# Patient Record
Sex: Female | Born: 1950
Health system: Southern US, Community
[De-identification: ages and names within clinical notes are randomized; demographics above are authoritative.]

## PROBLEM LIST (undated history)

## (undated) DIAGNOSIS — M810 Age-related osteoporosis without current pathological fracture: Secondary | ICD-10-CM

## (undated) DIAGNOSIS — F329 Major depressive disorder, single episode, unspecified: Secondary | ICD-10-CM

## (undated) DIAGNOSIS — M1990 Unspecified osteoarthritis, unspecified site: Secondary | ICD-10-CM

## (undated) DIAGNOSIS — E785 Hyperlipidemia, unspecified: Secondary | ICD-10-CM

## (undated) DIAGNOSIS — F32A Depression, unspecified: Secondary | ICD-10-CM

## (undated) DIAGNOSIS — J45909 Unspecified asthma, uncomplicated: Secondary | ICD-10-CM

## (undated) DIAGNOSIS — T7840XA Allergy, unspecified, initial encounter: Secondary | ICD-10-CM

## (undated) DIAGNOSIS — F419 Anxiety disorder, unspecified: Secondary | ICD-10-CM

## (undated) DIAGNOSIS — H269 Unspecified cataract: Secondary | ICD-10-CM

## (undated) DIAGNOSIS — G43909 Migraine, unspecified, not intractable, without status migrainosus: Secondary | ICD-10-CM

## (undated) DIAGNOSIS — B181 Chronic viral hepatitis B without delta-agent: Secondary | ICD-10-CM

## (undated) HISTORY — DX: Chronic viral hepatitis B without delta-agent: B18.1

## (undated) HISTORY — DX: Age-related osteoporosis without current pathological fracture: M81.0

## (undated) HISTORY — DX: Migraine, unspecified, not intractable, without status migrainosus: G43.909

## (undated) HISTORY — PX: EYE SURGERY: SHX253

## (undated) HISTORY — DX: Depression, unspecified: F32.A

## (undated) HISTORY — DX: Unspecified cataract: H26.9

## (undated) HISTORY — DX: Major depressive disorder, single episode, unspecified: F32.9

## (undated) HISTORY — PX: WISDOM TOOTH EXTRACTION: SHX21

## (undated) HISTORY — DX: Allergy, unspecified, initial encounter: T78.40XA

## (undated) HISTORY — PX: URETHRAL DILATION: SUR417

## (undated) HISTORY — DX: Unspecified osteoarthritis, unspecified site: M19.90

## (undated) HISTORY — DX: Hyperlipidemia, unspecified: E78.5

## (undated) HISTORY — DX: Unspecified asthma, uncomplicated: J45.909

## (undated) HISTORY — DX: Anxiety disorder, unspecified: F41.9

---

## 1991-12-21 HISTORY — PX: LIVER BIOPSY: SHX301

## 2016-08-16 ENCOUNTER — Ambulatory Visit (INDEPENDENT_AMBULATORY_CARE_PROVIDER_SITE_OTHER): Payer: Medicare Other | Admitting: Neurology

## 2016-08-16 ENCOUNTER — Encounter: Payer: Self-pay | Admitting: Neurology

## 2016-08-16 VITALS — BP 126/72 | HR 88 | Temp 97.6°F | Wt 166.2 lb

## 2016-08-16 DIAGNOSIS — R413 Other amnesia: Secondary | ICD-10-CM | POA: Diagnosis not present

## 2016-08-16 DIAGNOSIS — G43909 Migraine, unspecified, not intractable, without status migrainosus: Secondary | ICD-10-CM | POA: Insufficient documentation

## 2016-08-16 DIAGNOSIS — G43709 Chronic migraine without aura, not intractable, without status migrainosus: Secondary | ICD-10-CM | POA: Diagnosis not present

## 2016-08-16 DIAGNOSIS — IMO0002 Reserved for concepts with insufficient information to code with codable children: Secondary | ICD-10-CM

## 2016-08-16 MED ORDER — ZOLMITRIPTAN 5 MG NA SOLN: 1.0000 | NASAL | 0 refills | Status: DC | PRN

## 2016-08-16 MED ORDER — DULOXETINE HCL 20 MG PO CPEP: ORAL_CAPSULE | ORAL | 5 refills | Status: DC

## 2016-08-16 MED ORDER — RELPAX 40 MG PO TABS: ORAL_TABLET | ORAL | 6 refills | Status: DC

## 2016-08-16 NOTE — Patient Instructions (Addendum)
1. Schedule for Botox with Dr. Tomi Likens or Posey Pronto 2. Increase Cymbalta 20mg : take 2 tablets daily 3. Take Relpax 40mg  at onset of migraine, do not take more than 2-3 a week to avoid rebound headaches 4. Try Zomig nasal spray for migraines upon awakening, let us know if helpful and we can call in a prescription 5. Follow-up in 3 months, call for any changes

## 2016-08-16 NOTE — Progress Notes (Signed)
NEUROLOGY CONSULTATION NOTE  Alyssa Durham MRN: [id number] DOB: 01/27/1951  Referring provider: Dr. Tor Netters Primary care provider: none  Reason for consult:  Chronic migraines  Dear Dr Tamsen Meek:  Thank you for your kind referral of Alyssa Durham for consultation of the above symptoms. Although her history is well known to you, please allow me to reiterate it for the purpose of our medical record. Records and images were personally reviewed where available.  HISTORY OF PRESENT ILLNESS: This is a 65 year old right-handed woman with a history of chronic migraines, depression, presenting to establish care for her migraines. She moved from [place] 2 months ago, records from her previous neurologist were reviewed. She has had migraines since 1969. Migraines now are mostly localized over the right hemisphere, if she closes her eyes, she sees flashing lights, but no prior aura. This is associated with nausea, vomiting, photo and phonophobia. She has 4-5 migraine days a week, taking 1/2 to 2 tablets of Relpax at the onset, which does help ease progression of symptoms. For the past 4 years, she has had migraines upon awakening. Migraines were fairly well-controlled until 1993 when she was started on a trial of an unrecalled antidepressant which helped. In December 2016, headaches worsened and she was started on Lexapro, which helped with headaches but caused weight gain and hair loss. She tried Celexa (weight gain), Zoloft (weight gain), Wellbutrin. She also tried Topamax but stopped due to risk for kidney stones. She recalls taking Inderal but cannot recall effects/side effects. Most recently, she was tried on nortriptyline, which did not help with the headaches, but helped her mood. She is now on Cymbalta for arthritis, which also helps with her mood. For rescue, she has tried sumatriptan, Maxalt, and Zomig, with no effect. Relpax and Frova helped the most, causing less palpitations. She has noticed  stress to be a big trigger, as well a chocolates. She usually sleeps good. There is a family history of migraines in her father, brother, and niece. She had received 2 rounds of Botox injections for chronic migraine, the first one helped very well with much less use of Relpax. Last Botox was in April 2017 before her move.   She is also reporting memory loss. She had told her neurologist about this in 1994. Memory issues are more for long-term memory, she cannot recall what happened years ago with her family, which concerns her because she cannot recall her children growing up. Short-term memory is pretty good, she denies getting lost driving, no missed medications or bill payments. She denies any dizziness, diplopia, dysarthria, dysphagia, neck pain, bowel/bladder dysfunction. She has floaters in her left eye.   Diagnostic Data: MRI brain without contrast done 04/17/15 at [facility] in [place] did not show any acute changes, there was subtle white matter hyperintensity in the posterior left occipital lobe. Compared to 2007 study, this is only minimally progressive. EEG done 04/18/15 in [place] was within normal limits.  PAST MEDICAL HISTORY: Past Medical History:  Diagnosis Date  . Asthma   . Chronic active type B viral hepatitis ([place])   . Migraines   . Osteoporosis     PAST SURGICAL HISTORY: Past Surgical History:  Procedure Laterality Date  . CESAREAN SECTION    . URETHRAL DILATION    . WISDOM TOOTH EXTRACTION      MEDICATIONS: Alendronate Cymbalta 20mg  daily Relpax 40mg  at onset of migraine   No current facility-administered medications on file prior to visit.  ALLERGIES: Allergies  Allergen Reactions  . Codeine Nausea And Vomiting    FAMILY HISTORY: Family History  Problem Relation Age of Onset  . Migraines Father   . Stroke Father     SOCIAL HISTORY: Social History   Social History  . Marital status: Divorced    Spouse name: N/A  . Number of  children: N/A  . Years of education: N/A   Occupational History  . Not on file.   Social History Main Topics  . Smoking status: Not on file  . Smokeless tobacco: Not on file  . Alcohol use Not on file  . Drug use: Unknown  . Sexual activity: Not on file   Other Topics Concern  . Not on file   Social History Narrative  . No narrative on file    REVIEW OF SYSTEMS: Constitutional: No fevers, chills, or sweats, no generalized fatigue, change in appetite Eyes: No visual changes, double vision, eye pain Ear, nose and throat: No hearing loss, ear pain, nasal congestion, sore throat Cardiovascular: No chest pain, palpitations Respiratory:  No shortness of breath at rest or with exertion, wheezes GastrointestinaI: No nausea, vomiting, diarrhea, abdominal pain, fecal incontinence Genitourinary:  No dysuria, urinary retention or frequency Musculoskeletal:  No neck pain, +back pain Integumentary: No rash, pruritus, skin lesions Neurological: as above Psychiatric: No depression, insomnia, anxiety Endocrine: No palpitations, fatigue, diaphoresis, mood swings, change in appetite, change in weight, increased thirst Hematologic/Lymphatic:  No anemia, purpura, petechiae. Allergic/Immunologic: no itchy/runny eyes, nasal congestion, recent allergic reactions, rashes  PHYSICAL EXAM: Vitals:   08/16/16 0903  BP: 126/72  Pulse: 88  Temp: 97.6 F (36.4 C)   General: No acute distress Head:  Normocephalic/atraumatic Eyes: Fundoscopic exam shows bilateral sharp discs, no vessel changes, exudates, or hemorrhages Neck: supple, no paraspinal tenderness, full range of motion Back: No paraspinal tenderness Heart: regular rate and rhythm Lungs: Clear to auscultation bilaterally. Vascular: No carotid bruits. Skin/Extremities: No rash, no edema Neurological Exam: Mental status: alert and oriented to person, place, and time, no dysarthria or aphasia, Fund of knowledge is appropriate.  Recent and  remote memory are intact.  Attention and concentration are normal.    Able to name objects and repeat phrases.  MMSE - Mini Mental State Exam 08/16/2016  Orientation to time 5  Orientation to Place 5  Registration 3  Attention/ Calculation 5  Recall 3  Language- name 2 objects 2  Language- repeat 1  Language- follow 3 step command 3  Language- read & follow direction 1  Write a sentence 1  Copy design 1  Total score 30   Cranial nerves: CN I: not tested CN II: pupils equal, round and reactive to light, visual fields intact, fundi unremarkable. CN III, IV, VI:  full range of motion, no nystagmus, no ptosis CN V: facial sensation intact CN VII: upper and lower face symmetric CN VIII: hearing intact to finger rub CN IX, X: gag intact, uvula midline CN XI: sternocleidomastoid and trapezius muscles intact CN XII: tongue midline Bulk & Tone: normal, no fasciculations. Motor: 5/5 throughout with no pronator drift. Sensation: intact to light touch, cold, pin, vibration and joint position sense.  No extinction to double simultaneous stimulation.  Romberg test negative Deep Tendon Reflexes: brisk +2 throughout, no ankle clonus, negative Hoffman sign Plantar responses: downgoing bilaterally Cerebellar: no incoordination on finger to nose, heel to shin. No dysdiadochokinesia Gait: narrow-based and steady, able to tandem walk adequately. Tremor: none  IMPRESSION: This is a 65  year old right-handed woman with chronic migraines, with 4-5 migraines a week. She had good response to Botox in [place], and would like to resume here in [place]. She will be set up for this in our office. She was also instructed to minimize rescue medication/Relpax use to 2-3 times a week to avoid rebound headaches. She will increase Cymbalta to 40mg  daily for headache prevention as well as mood. Side effects were discussed. We discussed memory concerns, including different causes of memory issues, such as checking  B12, TSH, and doing Neuropsychological evaluation. She would like to hold off for now and focus on the headaches. She will follow-up in 3 months and knows to call for any issues.  Thank you for allowing me to participate in the care of this patient. Please do not hesitate to call for any questions or concerns.   Ellouise Newer, M.D.  CC: Dr. Tamsen Meek

## 2016-08-31 ENCOUNTER — Ambulatory Visit (INDEPENDENT_AMBULATORY_CARE_PROVIDER_SITE_OTHER): Payer: Medicare Other | Admitting: Neurology

## 2016-08-31 ENCOUNTER — Other Ambulatory Visit (INDEPENDENT_AMBULATORY_CARE_PROVIDER_SITE_OTHER): Payer: Medicare Other | Admitting: *Deleted

## 2016-08-31 ENCOUNTER — Telehealth: Payer: Self-pay

## 2016-08-31 DIAGNOSIS — G43011 Migraine without aura, intractable, with status migrainosus: Secondary | ICD-10-CM

## 2016-08-31 DIAGNOSIS — G4489 Other headache syndrome: Secondary | ICD-10-CM | POA: Diagnosis not present

## 2016-08-31 DIAGNOSIS — IMO0002 Reserved for concepts with insufficient information to code with codable children: Secondary | ICD-10-CM

## 2016-08-31 DIAGNOSIS — G43709 Chronic migraine without aura, not intractable, without status migrainosus: Secondary | ICD-10-CM

## 2016-08-31 MED ORDER — ZOLMITRIPTAN 5 MG NA SOLN
1.0000 | NASAL | 0 refills | Status: DC | PRN
Start: 2016-08-31 — End: 2016-11-16

## 2016-08-31 MED ORDER — ONABOTULINUMTOXINA 100 UNITS IJ SOLR
155.0000 [IU] | Freq: Once | INTRAMUSCULAR | Status: AC
  Administered 2016-08-31: 155 [IU] via INTRAMUSCULAR

## 2016-08-31 NOTE — Procedures (Signed)
[facility]   Procedure Note Botox  Attending: Dr. Narda Amber  Preoperative Diagnosis(es): Chronic migraine  Consent obtained from: The patient Benefits discussed included, but were not limited to decreased muscle tightness, increased joint range of motion, and decreased pain.  Risk discussed included, but were not limited pain and discomfort, bleeding, bruising, excessive weakness, venous thrombosis, muscle atrophy and dysphagia.  Anticipated outcomes of the procedure as well as he risks and benefits of the alternatives to the procedure, and the roles and tasks of the personnel to be involved, were discussed with the patient, and the patient consents to the procedure and agrees to proceed. A copy of the patient medication guide was given to the patient which explains the blackbox warning.  Patients identity and treatment sites confirmed Yes.    Details of Procedure: Skin was cleaned with alcohol. Prior to injection, the needle plunger was aspirated to make sure the needle was not within a blood vessel.  There was no blood retrieved on aspiration.    Following is a summary of the muscles injected  And the amount of Botulinum toxin used:   Agent and Dilution 200 units of botulinum Type A (Onobotulinum Toxin type A) was reconstituted with 4 ml of preservative free normal saline.  Time of reconstitution: At the time of the office visit (<30 minutes prior to injection)   Injections   155 total units of Botox was injected with a 30 gauge needle.  Injection Sites: L occipitalis: 15 units- 3 sites  R occiptalis: 15 units- 3 sites  L upper trapezius: 15 units- 3 sites R upper trapezius: 15 units- 3 sits          L paraspinal (___mid__level): 10 units- 2 sites R paraspinal (__mid___level): 10 units- 2 sites   Face L frontalis(2 injection sites):10 units   R frontalis(2 injection sites):10 units         L corrugator: 5 units   R corrugator: 5 units           Procerus: 5  units   L temporalis: 20 units R temporalis: 20 units    Total injected (Units): 155  Total wasted (Units): 45   Patient tolerated procedure well without complications.   Reinjection is anticipated in 3 months.

## 2016-08-31 NOTE — Telephone Encounter (Signed)
Sample Zomig nasal spray left up front.

## 2016-11-09 ENCOUNTER — Ambulatory Visit (INDEPENDENT_AMBULATORY_CARE_PROVIDER_SITE_OTHER): Payer: Medicare Other | Admitting: Neurology

## 2016-11-09 DIAGNOSIS — G43709 Chronic migraine without aura, not intractable, without status migrainosus: Secondary | ICD-10-CM

## 2016-11-09 DIAGNOSIS — IMO0002 Reserved for concepts with insufficient information to code with codable children: Secondary | ICD-10-CM

## 2016-11-09 DIAGNOSIS — G43011 Migraine without aura, intractable, with status migrainosus: Secondary | ICD-10-CM

## 2016-11-09 MED ORDER — ONABOTULINUMTOXINA 100 UNITS IJ SOLR
155.0000 [IU] | Freq: Once | INTRAMUSCULAR | Status: AC
  Administered 2016-11-09: 155 [IU] via INTRAMUSCULAR

## 2016-11-09 NOTE — Procedures (Signed)
[facility]   Procedure Note Botox  Attending: Dr. Narda Amber  Preoperative Diagnosis(es): Chronic migraine  Consent obtained from: The patient Benefits discussed included, but were not limited to decreased muscle tightness, increased joint range of motion, and decreased pain.  Risk discussed included, but were not limited pain and discomfort, bleeding, bruising, excessive weakness, venous thrombosis, muscle atrophy and dysphagia.  Anticipated outcomes of the procedure as well as he risks and benefits of the alternatives to the procedure, and the roles and tasks of the personnel to be involved, were discussed with the patient, and the patient consents to the procedure and agrees to proceed. A copy of the patient medication guide was given to the patient which explains the blackbox warning.  Patients identity and treatment sites confirmed Yes.    Details of Procedure: Skin was cleaned with alcohol. Prior to injection, the needle plunger was aspirated to make sure the needle was not within a blood vessel.  There was no blood retrieved on aspiration.    Following is a summary of the muscles injected  And the amount of Botulinum toxin used:   Agent and Dilution 200 units of botulinum Type A (Onobotulinum Toxin type A) was reconstituted with 4 ml of preservative free normal saline.  Time of reconstitution: At the time of the office visit (<30 minutes prior to injection)   Injections   155 total units of Botox was injected with a 30 gauge needle.  Injection Sites: L occipitalis: 15 units- 3 sites  R occiptalis: 15 units- 3 sites  L upper trapezius: 15 units- 3 sites R upper trapezius: 15 units- 3 sits          L paraspinal (___mid__level): 10 units- 2 sites R paraspinal (__mid___level): 10 units- 2 sites   Face L frontalis(2 injection sites):10 units   R frontalis(2 injection sites):10 units         L corrugator: 5 units   R corrugator: 5 units           Procerus: 5  units   L temporalis: 20 units R temporalis: 20 units    Total injected (Units): 155  Total wasted (Units): 45   Patient tolerated procedure well without complications.   Reinjection is anticipated in 3 months.

## 2016-11-16 ENCOUNTER — Encounter: Payer: Self-pay | Admitting: Neurology

## 2016-11-16 ENCOUNTER — Ambulatory Visit (INDEPENDENT_AMBULATORY_CARE_PROVIDER_SITE_OTHER): Payer: Self-pay | Admitting: Neurology

## 2016-11-16 VITALS — BP 122/78 | HR 83 | Ht 61.0 in | Wt 168.5 lb

## 2016-11-16 DIAGNOSIS — G43709 Chronic migraine without aura, not intractable, without status migrainosus: Secondary | ICD-10-CM

## 2016-11-16 DIAGNOSIS — IMO0002 Reserved for concepts with insufficient information to code with codable children: Secondary | ICD-10-CM

## 2016-11-16 MED ORDER — ZONISAMIDE 100 MG PO CAPS: ORAL_CAPSULE | ORAL | 6 refills | Status: DC

## 2016-11-16 NOTE — Progress Notes (Signed)
NEUROLOGY FOLLOW UP OFFICE NOTE  Alyssa Durham [id number]  HISTORY OF PRESENT ILLNESS: I had the pleasure of seeing Alyssa Durham in follow-up in the neurology clinic on 11/16/2016.  The patient was last seen 3 months ago for chronic migraines and memory loss. Since her last visit, she had Botox with Dr. Posey Pronto last week. Dose of Cymbalta was increased on her last visit to 40mg  daily, however she did not tolerate this and went back to 20mg /day. She reports feeling "wired up, racing, and unable to sleep" on the higher dose. She feels the Botox has reduced her migraines by 50%. She was reporting 4-5 migraine days a week, but has had not migraines on awakening the past 2 mornings.   HPI 08/16/2016: This is a 65 yo RH woman with a history of chronic migraines, depression. She moved from [place] 2 months ago, records from her previous neurologist were reviewed. She has had migraines since 1969. Migraines now are mostly localized over the right hemisphere, if she closes her eyes, she sees flashing lights, but no prior aura. This is associated with nausea, vomiting, photo and phonophobia. She has 4-5 migraine days a week, taking 1/2 to 2 tablets of Relpax at the onset, which does help ease progression of symptoms. For the past 4 years, she has had migraines upon awakening. Migraines were fairly well-controlled until 1993 when she was started on a trial of an unrecalled antidepressant which helped. In December 2016, headaches worsened and she was started on Lexapro, which helped with headaches but caused weight gain and hair loss. She tried Celexa (weight gain), Zoloft (weight gain), Wellbutrin. She also tried Topamax but stopped due to risk for kidney stones. She recalls taking Inderal but cannot recall effects/side effects. Most recently, she was tried on nortriptyline, which did not help with the headaches, but helped her mood. She is now on Cymbalta for arthritis, which also helps with her mood. For rescue, she has  tried sumatriptan, Maxalt, and Zomig, with no effect. Relpax and Frova helped the most, causing less palpitations. She has noticed stress to be a big trigger, as well a chocolates. She usually sleeps good. There is a family history of migraines in her father, brother, and niece. She had received 2 rounds of Botox injections for chronic migraine, the first one helped very well with much less use of Relpax. Last Botox was in April 2017 before her move.   She is also reporting memory loss. She had told her neurologist about this in 1994. Memory issues are more for long-term memory, she cannot recall what happened years ago with her family, which concerns her because she cannot recall her children growing up. Short-term memory is pretty good, she denies getting lost driving, no missed medications or bill payments. She denies any dizziness, diplopia, dysarthria, dysphagia, neck pain, bowel/bladder dysfunction. She has floaters in her left eye.   Diagnostic Data: MRI brain without contrast done 04/17/15 at [facility] in [place] did not show any acute changes, there was subtle white matter hyperintensity in the posterior left occipital lobe. Compared to 2007 study, this is only minimally progressive. EEG done 04/18/15 in [place] was within normal limits.  PAST MEDICAL HISTORY: Past Medical History:  Diagnosis Date  . Asthma   . Chronic active type B viral hepatitis ([place])   . Migraines   . Osteoporosis     MEDICATIONS: Current Outpatient Prescriptions on File Prior to Visit  Medication Sig Dispense Refill  . Alendronate Sodium (FOSAMAX PO)  Take by mouth.    . Clobetasol Prop Emollient Base 0.05 % emollient cream Apply topically 2 (two) times daily.    . DULoxetine (CYMBALTA) 20 MG capsule Take 2 tablets daily 60 capsule 5  . RELPAX 40 MG tablet Take 1 tablet at onset of migraine, do not take more than 3 a week 12 tablet 6  . zolmitriptan (ZOMIG) 5 MG nasal solution Place 1 spray into  the nose as needed for migraine. 1 each 0   No current facility-administered medications on file prior to visit.     ALLERGIES: Allergies  Allergen Reactions  . Codeine Nausea And Vomiting    FAMILY HISTORY: Family History  Problem Relation Age of Onset  . Migraines Father   . Stroke Father     SOCIAL HISTORY: Social History   Social History  . Marital status: Divorced    Spouse name: N/A  . Number of children: N/A  . Years of education: N/A   Occupational History  . Not on file.   Social History Main Topics  . Smoking status: Former Research scientist (life sciences)  . Smokeless tobacco: Never Used  . Alcohol use No  . Drug use: No  . Sexual activity: Not on file   Other Topics Concern  . Not on file   Social History Narrative  . No narrative on file    REVIEW OF SYSTEMS: Constitutional: No fevers, chills, or sweats, no generalized fatigue, change in appetite Eyes: No visual changes, double vision, eye pain Ear, nose and throat: No hearing loss, ear pain, nasal congestion, sore throat Cardiovascular: No chest pain, palpitations Respiratory:  No shortness of breath at rest or with exertion, wheezes GastrointestinaI: No nausea, vomiting, diarrhea, abdominal pain, fecal incontinence Genitourinary:  No dysuria, urinary retention or frequency Musculoskeletal:  No neck pain, back pain Integumentary: No rash, pruritus, skin lesions Neurological: as above Psychiatric: No depression, insomnia, anxiety Endocrine: No palpitations, fatigue, diaphoresis, mood swings, change in appetite, change in weight, increased thirst Hematologic/Lymphatic:  No anemia, purpura, petechiae. Allergic/Immunologic: no itchy/runny eyes, nasal congestion, recent allergic reactions, rashes  PHYSICAL EXAM: Vitals:   11/16/16 1424  BP: 122/78  Pulse: 83   General: No acute distress Head:  Normocephalic/atraumatic Neck: supple, no paraspinal tenderness, full range of motion Heart:  Regular rate and  rhythm Lungs:  Clear to auscultation bilaterally Back: No paraspinal tenderness Skin/Extremities: No rash, no edema Neurological Exam: alert and oriented to person, place, and time. No aphasia or dysarthria. Fund of knowledge is appropriate.  Recent and remote memory are intact.  Attention and concentration are normal.    Able to name objects and repeat phrases. Cranial nerves: Pupils equal, round, reactive to light.  Fundoscopic exam unremarkable, no papilledema. Extraocular movements intact with no nystagmus. Visual fields full. Facial sensation intact. No facial asymmetry. Tongue, uvula, palate midline.  Motor: Bulk and tone normal, muscle strength 5/5 throughout with no pronator drift.  Sensation to light touch intact.  No extinction to double simultaneous stimulation.  Deep tendon reflexes brisk  2+ throughout, toes downgoing.  Finger to nose testing intact.  Gait narrow-based and steady, able to tandem walk adequately.  Romberg negative.  IMPRESSION: This is a 65 yo RH woman with chronic migraines, with 4-5 migraines a week. She had good response to Botox in [place], and after 1 session last week reports a 50% reduction in migraines. Continue Botox every 3 months. She did not tolerate higher dose Cymbalta and is interested in trying a different headache preventative medication.  She will start low dose Zonisamide 100mg  qhs, side effects were discussed. She has a history of kidney stones, there is a low risk of kidney stones with Zonisamide, she was advised to increase hydration and alert [place] if any symptoms. She knows to minimize rescue medication/Relpax use to 2-3 times a week to avoid rebound headaches. We will address memory concerns on her next visit in 3 months.   Thank you for allowing me to participate in her care.  Please do not hesitate to call for any questions or concerns.  The duration of this appointment visit was 15 minutes of face-to-face time with the patient.  Greater than 50% of this  time was spent in counseling, explanation of diagnosis, planning of further management, and coordination of care.   Ellouise Newer, M.D.

## 2016-11-16 NOTE — Patient Instructions (Signed)
1. Schedule Botox with Dr. Posey Pronto for February 2. Start Zonisamide 100mg : take 1 capsule at night 3. Minimize Relpax intake to 2-3 a week to avoid rebound headaches 4. Follow-up in 3 months (after Botox), call for any changes

## 2016-11-19 ENCOUNTER — Other Ambulatory Visit: Payer: Self-pay | Admitting: Neurology

## 2016-11-19 DIAGNOSIS — IMO0002 Reserved for concepts with insufficient information to code with codable children: Secondary | ICD-10-CM

## 2016-11-19 DIAGNOSIS — G43709 Chronic migraine without aura, not intractable, without status migrainosus: Secondary | ICD-10-CM

## 2016-11-19 NOTE — Telephone Encounter (Signed)
Rx sent 

## 2016-12-25 ENCOUNTER — Other Ambulatory Visit: Payer: Self-pay | Admitting: Neurology

## 2016-12-25 DIAGNOSIS — IMO0002 Reserved for concepts with insufficient information to code with codable children: Secondary | ICD-10-CM

## 2016-12-25 DIAGNOSIS — G43709 Chronic migraine without aura, not intractable, without status migrainosus: Secondary | ICD-10-CM

## 2016-12-27 ENCOUNTER — Telehealth: Payer: Self-pay | Admitting: Neurology

## 2016-12-27 NOTE — Telephone Encounter (Signed)
Responded to RX Request this AM . If prior auth required pharmacy will notify us. Patient notified.

## 2016-12-27 NOTE — Telephone Encounter (Signed)
Rx refill sent. Per last OV note patient to continue.

## 2016-12-27 NOTE — Telephone Encounter (Signed)
Alyssa Durham [birth year]. Her #[phone] She needs a refill on Relpax 40 MG Tablets. Her pharmacy is [facility] in [place]. She has a new Environmental consultant. She has a little over a half tablet left. She said something may have to be put in so insurance will cover (non Formulary)30 day supply?  Thank you

## 2017-01-18 ENCOUNTER — Other Ambulatory Visit: Payer: Self-pay | Admitting: Neurology

## 2017-01-18 DIAGNOSIS — G43709 Chronic migraine without aura, not intractable, without status migrainosus: Secondary | ICD-10-CM

## 2017-01-18 DIAGNOSIS — IMO0002 Reserved for concepts with insufficient information to code with codable children: Secondary | ICD-10-CM

## 2017-01-18 NOTE — Telephone Encounter (Signed)
RX refill sent to pharmacy. Per last note patient to continue.

## 2017-02-07 ENCOUNTER — Telehealth: Payer: Self-pay | Admitting: Neurology

## 2017-02-07 NOTE — Telephone Encounter (Signed)
PT called and is wanting to set up a Botox day/Dawn CB# [phone]

## 2017-02-08 NOTE — Telephone Encounter (Signed)
Will set up appointment when PA is received.  Left message notifying patient.

## 2017-02-18 ENCOUNTER — Telehealth: Payer: Self-pay | Admitting: Neurology

## 2017-02-18 NOTE — Telephone Encounter (Signed)
Alyssa Durham 2051-01-16. She was calling to check on her Botox Authorization? She said she is starting to have migraines. Her # is [phone]. Thank you

## 2017-02-18 NOTE — Telephone Encounter (Signed)
Patient notified that I will work on the PA and call her with an appointment.

## 2017-02-23 ENCOUNTER — Telehealth: Payer: Self-pay | Admitting: Neurology

## 2017-02-23 NOTE — Telephone Encounter (Signed)
Alyssa Durham 2051/07/26. Her # [phone]. She was calling to follow up on her Botox authorization and appointment. She said she is suffering and really needs to get the Botox. I did tell her that you were working on the [place]. Thank you

## 2017-02-23 NOTE — Telephone Encounter (Signed)
Called ins company and their computers were down.  Will try again.

## 2017-02-24 NOTE — Telephone Encounter (Signed)
Sent request for [facility] PA.

## 2017-02-28 ENCOUNTER — Other Ambulatory Visit: Payer: Self-pay | Admitting: Neurology

## 2017-02-28 ENCOUNTER — Ambulatory Visit: Payer: Medicare Other | Admitting: Neurology

## 2017-02-28 DIAGNOSIS — G43709 Chronic migraine without aura, not intractable, without status migrainosus: Secondary | ICD-10-CM

## 2017-02-28 DIAGNOSIS — IMO0002 Reserved for concepts with insufficient information to code with codable children: Secondary | ICD-10-CM

## 2017-03-01 ENCOUNTER — Telehealth: Payer: Self-pay | Admitting: *Deleted

## 2017-03-01 NOTE — Telephone Encounter (Signed)
Patient called her insurance company and they said that they do not need PA for Botox.  I also called and spoke with [facility] at [facility] and she looked it up and it does not need PA.  Will you let Alyssa Durham know when to schedule this please.  Thanks.

## 2017-03-08 ENCOUNTER — Ambulatory Visit: Payer: PPO | Admitting: Neurology

## 2017-03-10 DIAGNOSIS — L900 Lichen sclerosus et atrophicus: Secondary | ICD-10-CM | POA: Diagnosis not present

## 2017-03-10 DIAGNOSIS — L820 Inflamed seborrheic keratosis: Secondary | ICD-10-CM | POA: Diagnosis not present

## 2017-03-14 ENCOUNTER — Ambulatory Visit (INDEPENDENT_AMBULATORY_CARE_PROVIDER_SITE_OTHER): Payer: PPO | Admitting: Neurology

## 2017-03-14 ENCOUNTER — Encounter: Payer: Self-pay | Admitting: Neurology

## 2017-03-14 VITALS — BP 126/78 | HR 78 | Temp 98.0°F | Resp 16 | Ht 61.5 in | Wt 172.0 lb

## 2017-03-14 DIAGNOSIS — IMO0002 Reserved for concepts with insufficient information to code with codable children: Secondary | ICD-10-CM

## 2017-03-14 DIAGNOSIS — G43709 Chronic migraine without aura, not intractable, without status migrainosus: Secondary | ICD-10-CM

## 2017-03-14 MED ORDER — ZOLMITRIPTAN 5 MG NA SOLN: 1.0000 | NASAL | 0 refills | Status: DC | PRN

## 2017-03-14 MED ORDER — RELPAX 40 MG PO TABS: ORAL_TABLET | ORAL | 11 refills | Status: DC

## 2017-03-14 MED ORDER — ZONISAMIDE 100 MG PO CAPS: ORAL_CAPSULE | ORAL | 6 refills | Status: DC

## 2017-03-14 NOTE — Patient Instructions (Signed)
1. Increase Zonisamide 100mg : Take 2 capsules at night 2. Proceed with Botox as scheduled 3. Minimize Relpax to 2-3 times a week to avoid rebound headaches 4. Keep a calendar of your migraines 5. Follow-up in 4 months, call for any changes

## 2017-03-14 NOTE — Progress Notes (Signed)
NEUROLOGY FOLLOW UP OFFICE NOTE  Alyssa Durham [id number]  HISTORY OF PRESENT ILLNESS: I had the pleasure of seeing Ibeth Anzalone in follow-up in the neurology clinic on  03/14/2017.  The patient was last seen 4 months ago for chronic migraines and memory loss. On her last visit, she reported reduction in migraines by 50% with re-initiation of Botox. She is scheduled for her next session next week. On her last visit, she was started on low dose Zonisamide. She reports she is so much better than before, she has around 2-3 migraines a week. Relpax helps. In January, she was waking up migraine-free more days than usual. However she was back to her original pattern in February. She initially felt "something in my urinary tract" when first starting Zonisamide, but with increased water intake, this has resolved. She briefly tapered herself off Cymbalta due to weight gain, however with recent stresses, she noticed that she was much more irritable. She restarted it last week, and within 2-3 days she was feeling better. She was also taking Cymbalta for joint pains, this briefly worsened off Cymbalta as well. She denies any diplopia, vision changes, dizziness, no falls. She is having a mild migraine now.  HPI 08/16/2016: This is a 66 yo RH woman with a history of chronic migraines, depression. She moved from [place] 2 months ago, records from her previous neurologist were reviewed. She has had migraines since 1969. Migraines now are mostly localized over the right hemisphere, if she closes her eyes, she sees flashing lights, but no prior aura. This is associated with nausea, vomiting, photo and phonophobia. She has 4-5 migraine days a week, taking 1/2 to 2 tablets of Relpax at the onset, which does help ease progression of symptoms. For the past 4 years, she has had migraines upon awakening. Migraines were fairly well-controlled until 1993 when she was started on a trial of an unrecalled antidepressant which helped. In  December 2016, headaches worsened and she was started on Lexapro, which helped with headaches but caused weight gain and hair loss. She tried Celexa (weight gain), Zoloft (weight gain), Wellbutrin. She also tried Topamax but stopped due to risk for kidney stones. She recalls taking Inderal but cannot recall effects/side effects. Most recently, she was tried on nortriptyline, which did not help with the headaches, but helped her mood. She is now on Cymbalta for arthritis, which also helps with her mood. For rescue, she has tried sumatriptan, Maxalt, and Zomig, with no effect. Relpax and Frova helped the most, causing less palpitations. She has noticed stress to be a big trigger, as well a chocolates. She usually sleeps good. There is a family history of migraines in her father, brother, and niece. She had received 2 rounds of Botox injections for chronic migraine, the first one helped very well with much less use of Relpax. Last Botox was in April 2017 before her move.   She is also reporting memory loss. She had told her neurologist about this in 1994. Memory issues are more for long-term memory, she cannot recall what happened years ago with her family, which concerns her because she cannot recall her children growing up. Short-term memory is pretty good, she denies getting lost driving, no missed medications or bill payments. She denies any dizziness, diplopia, dysarthria, dysphagia, neck pain, bowel/bladder dysfunction. She has floaters in her left eye.   Diagnostic Data: MRI brain without contrast done 04/17/15 at [facility] in [place] did not show any acute changes, there was subtle  white matter hyperintensity in the posterior left occipital lobe. Compared to 2007 study, this is only minimally progressive. EEG done 04/18/15 in [place] was within normal limits.  PAST MEDICAL HISTORY: Past Medical History:  Diagnosis Date  . Asthma   . Chronic active type B viral hepatitis ([place])   .  Migraines   . Osteoporosis     MEDICATIONS: Current Outpatient Prescriptions on File Prior to Visit  Medication Sig Dispense Refill  . Alendronate Sodium (FOSAMAX PO) Take by mouth.    . Clobetasol Prop Emollient Base 0.05 % emollient cream Apply topically 2 (two) times daily.    . DULoxetine (CYMBALTA) 20 MG capsule Take 2 tablets daily 60 capsule 5  . RELPAX 40 MG tablet TAKE 1 TABLET BY MOUTH AT ONSET OF MIGRAINE. DO NOT TAKE MORE THAN 3 A WEEK 12 tablet 0  . zonisamide (ZONEGRAN) 100 MG capsule Take 1 capsule at night 30 capsule 6   No current facility-administered medications on file prior to visit.     ALLERGIES: Allergies  Allergen Reactions  . Codeine Nausea And Vomiting    FAMILY HISTORY: Family History  Problem Relation Age of Onset  . Migraines Father   . Stroke Father     SOCIAL HISTORY: Social History   Social History  . Marital status: Divorced    Spouse name: N/A  . Number of children: N/A  . Years of education: N/A   Occupational History  . Not on file.   Social History Main Topics  . Smoking status: Former Research scientist (life sciences)  . Smokeless tobacco: Never Used  . Alcohol use No  . Drug use: No  . Sexual activity: Not on file   Other Topics Concern  . Not on file   Social History Narrative  . No narrative on file    REVIEW OF SYSTEMS: Constitutional: No fevers, chills, or sweats, no generalized fatigue, change in appetite Eyes: No visual changes, double vision, eye pain Ear, nose and throat: No hearing loss, ear pain, nasal congestion, sore throat Cardiovascular: No chest pain, palpitations Respiratory:  No shortness of breath at rest or with exertion, wheezes GastrointestinaI: No nausea, vomiting, diarrhea, abdominal pain, fecal incontinence Genitourinary:  No dysuria, urinary retention or frequency Musculoskeletal:  No neck pain, back pain Integumentary: No rash, pruritus, skin lesions Neurological: as above Psychiatric: No depression, insomnia,  anxiety Endocrine: No palpitations, fatigue, diaphoresis, mood swings, change in appetite, change in weight, increased thirst Hematologic/Lymphatic:  No anemia, purpura, petechiae. Allergic/Immunologic: no itchy/runny eyes, nasal congestion, recent allergic reactions, rashes  PHYSICAL EXAM: Vitals:   03/14/17 1421  BP: 126/78  Pulse: 78  Resp: 16  Temp: 98 F (36.7 C)   General: No acute distress Head:  Normocephalic/atraumatic Neck: supple, no paraspinal tenderness, full range of motion Heart:  Regular rate and rhythm Lungs:  Clear to auscultation bilaterally Back: No paraspinal tenderness Skin/Extremities: No rash, no edema Neurological Exam: alert and oriented to person, place, and time. No aphasia or dysarthria. Fund of knowledge is appropriate.  Recent and remote memory are intact.  Attention and concentration are normal.    Able to name objects and repeat phrases. Cranial nerves: Pupils equal, round, reactive to light.  Fundoscopic exam unremarkable, no papilledema. Extraocular movements intact with no nystagmus. Visual fields full. Facial sensation intact. No facial asymmetry. Tongue, uvula, palate midline.  Motor: Bulk and tone normal, muscle strength 5/5 throughout with no pronator drift.  Sensation to light touch intact.  No extinction to double simultaneous stimulation.  Deep tendon reflexes brisk  2+ throughout, toes downgoing.  Finger to nose testing intact.  Gait narrow-based and steady, able to tandem walk adequately.  Romberg negative.  IMPRESSION: This is a 66 yo RH woman with chronic migraines, with 4-5 migraines a week. She had good response to Botox in [place], and this was re-initiated in our office with good response. She is scheduled for her next session next week. She also noted an improvement with addition of Zonisamide, we will increase dose to 200mg  qhs. She had problems stopping Cymbalta, and will discuss this with her new PCP in 2 weeks. She knows to minimize  Relpax to 2-3 a week to avoid rebound headaches. She will keep a calendar of her migraines and follow-up in 4 months.   Thank you for allowing me to participate in her care.  Please do not hesitate to call for any questions or concerns.  The duration of this appointment visit was 25 minutes of face-to-face time with the patient.  Greater than 50% of this time was spent in counseling, explanation of diagnosis, planning of further management, and coordination of care.   Ellouise Newer, M.D.

## 2017-03-17 ENCOUNTER — Ambulatory Visit: Payer: PPO | Admitting: Neurology

## 2017-03-22 ENCOUNTER — Ambulatory Visit (INDEPENDENT_AMBULATORY_CARE_PROVIDER_SITE_OTHER): Payer: PPO | Admitting: Neurology

## 2017-03-22 DIAGNOSIS — G43011 Migraine without aura, intractable, with status migrainosus: Secondary | ICD-10-CM

## 2017-03-22 MED ORDER — ONABOTULINUMTOXINA 100 UNITS IJ SOLR: 100.0000 [IU] | Freq: Once | INTRAMUSCULAR | Status: DC

## 2017-03-22 MED ORDER — ONABOTULINUMTOXINA 100 UNITS IJ SOLR
200.0000 [IU] | Freq: Once | INTRAMUSCULAR | Status: AC
  Administered 2017-03-22: 145 [IU] via INTRAMUSCULAR

## 2017-03-22 NOTE — Procedures (Signed)
[facility] Clinic   Procedure Note Botox  Attending: Dr. Narda Amber  Preoperative Diagnosis(es): Chronic migraine  Consent obtained from: The patient Benefits discussed included, but were not limited to decreased muscle tightness, increased joint range of motion, and decreased pain.  Risk discussed included, but were not limited pain and discomfort, bleeding, bruising, excessive weakness, venous thrombosis, muscle atrophy and dysphagia.  Anticipated outcomes of the procedure as well as he risks and benefits of the alternatives to the procedure, and the roles and tasks of the personnel to be involved, were discussed with the patient, and the patient consents to the procedure and agrees to proceed. A copy of the patient medication guide was given to the patient which explains the blackbox warning.  Patients identity and treatment sites confirmed Yes.    Details of Procedure: Skin was cleaned with alcohol. Prior to injection, the needle plunger was aspirated to make sure the needle was not within a blood vessel.  There was no blood retrieved on aspiration.    Following is a summary of the muscles injected  And the amount of Botulinum toxin used:   Agent and Dilution 200 units of botulinum Type A (Onobotulinum Toxin type A) was reconstituted with 4 ml of preservative free normal saline.  Time of reconstitution: At the time of the office visit (<30 minutes prior to injection)   Injections   155 total units of Botox was injected with a 30 gauge needle.  Injection Sites: L occipitalis: 15 units- 3 sites  R occiptalis: 15 units- 3 sites  L upper trapezius: 15 units- 3 sites R upper trapezius: 15 units- 3 sits          L paraspinal (___mid__level): 10 units- 2 sites R paraspinal (__mid___level): 10 units- 2 sites   Face L frontalis(2 injection sites):10 units   R frontalis(2 injection sites):10 units         L corrugator: 5 units   R corrugator: 5 units           Procerus: 5  units   L temporalis: 15 units R temporalis: 15 units    Total injected (Units): 145  Total wasted (Units): 55   Patient tolerated procedure well without complications.   Reinjection is anticipated in 3 months.

## 2017-03-29 ENCOUNTER — Ambulatory Visit (INDEPENDENT_AMBULATORY_CARE_PROVIDER_SITE_OTHER): Payer: PPO | Admitting: Family Medicine

## 2017-03-29 ENCOUNTER — Encounter: Payer: Self-pay | Admitting: Family Medicine

## 2017-03-29 VITALS — BP 138/88 | HR 80 | Temp 96.8°F | Resp 18 | Ht 62.0 in | Wt 172.1 lb

## 2017-03-29 DIAGNOSIS — M810 Age-related osteoporosis without current pathological fracture: Secondary | ICD-10-CM

## 2017-03-29 DIAGNOSIS — G43709 Chronic migraine without aura, not intractable, without status migrainosus: Secondary | ICD-10-CM | POA: Diagnosis not present

## 2017-03-29 DIAGNOSIS — N904 Leukoplakia of vulva: Secondary | ICD-10-CM

## 2017-03-29 DIAGNOSIS — F419 Anxiety disorder, unspecified: Secondary | ICD-10-CM

## 2017-03-29 DIAGNOSIS — B181 Chronic viral hepatitis B without delta-agent: Secondary | ICD-10-CM | POA: Insufficient documentation

## 2017-03-29 DIAGNOSIS — Z7689 Persons encountering health services in other specified circumstances: Secondary | ICD-10-CM

## 2017-03-29 DIAGNOSIS — F329 Major depressive disorder, single episode, unspecified: Secondary | ICD-10-CM

## 2017-03-29 DIAGNOSIS — F418 Other specified anxiety disorders: Secondary | ICD-10-CM | POA: Diagnosis not present

## 2017-03-29 DIAGNOSIS — F32A Depression, unspecified: Secondary | ICD-10-CM | POA: Insufficient documentation

## 2017-03-29 DIAGNOSIS — IMO0002 Reserved for concepts with insufficient information to code with codable children: Secondary | ICD-10-CM

## 2017-03-29 MED ORDER — VENLAFAXINE HCL ER 37.5 MG PO CP24: 37.5000 mg | ORAL_CAPSULE | Freq: Every day | ORAL | 1 refills | Status: DC

## 2017-03-29 NOTE — Patient Instructions (Addendum)
Need old records from [place] every day that you are able  I am prescribing Effexor Take one a day This is in place of Cymbalta  See me in a month Call sooner for problems  (Labs are ordered for future - not needed today)

## 2017-03-29 NOTE — Progress Notes (Signed)
Chief Complaint  Patient presents with  . Establish Care  Previous primary care was in [place]. She states she moved here just under a year ago. She states before she left [place] she had a physical examination blood work. She states she is up-to-date with Pap smear mammogram colonoscopy and immunizations. These records will be requested. When she is due for her next colonoscopy she prefers to try a cologuard if it covered by insurance. She has a chronic migraine condition. She is under the care of neurology. Her migraines are currently reasonably well controlled with a combination of medication of Botox injections. We discussed the importance of having regular exercise, and eating a heart healthy diet for long-term well-being. She has chronic anxiety and depression. She has been on an SSRI intermittently over the years. While she agrees that she feels better taking an SSRI, she feels that all of them cause her to gain weight. She currently has tapered herself off of her duloxetine. She finds herself very irritable. The SSRI also helps her with her headache frequency. I discussed with her that we should try to find an SSRI that she can tolerate for long-term use that will not cause weight gain. I recommended either fluoxetine or Effexor as reasonable choices. Patient was found on routine lab work to have hepatitis B. She has chronic hepatitis B and is followed by gastroenterology with an ultrasound every 6 months and fiber sure testing area she states she is overdue for gastroenterology follow-up and a referral is placed for her.  Patient Active Problem List   Diagnosis Date Noted  . Anxiety and depression 03/29/2017  . Chronic hepatitis B ([place]) 03/29/2017  . Lichen sclerosus of female genitalia 03/29/2017  . Osteoporosis 03/29/2017  . Chronic migraine 08/16/2016    Outpatient Encounter Prescriptions as of 03/29/2017  Medication Sig  . Clobetasol Prop Emollient Base 0.05 % emollient cream  Apply topically 2 (two) times daily.  . RELPAX 40 MG tablet TAKE 1 TABLET BY MOUTH AT ONSET OF MIGRAINE. DO NOT TAKE MORE THAN 3 A WEEK  . zolmitriptan (ZOMIG) 5 MG nasal solution Place 1 spray into the nose as needed for migraine.  Marland Kitchen zonisamide (ZONEGRAN) 100 MG capsule Take 2 capsules at night  . Alendronate Sodium (FOSAMAX PO) Take by mouth.  . venlafaxine XR (EFFEXOR XR) 37.5 MG 24 hr capsule Take 1 capsule (37.5 mg total) by mouth daily with breakfast.   No facility-administered encounter medications on file as of 03/29/2017.     Past Medical History:  Diagnosis Date  . Allergy   . Arthritis   . Asthma   . Cataract   . Chronic active type B viral hepatitis ([place])   . Depression   . Migraines   . Osteoporosis     Past Surgical History:  Procedure Laterality Date  . CESAREAN SECTION    . URETHRAL DILATION    . WISDOM TOOTH EXTRACTION      Social History   Social History  . Marital status: Divorced    Spouse name: N/A  . Number of children: 3  . Years of education: 14   Occupational History  . retired     Chiropractor   Social History Main Topics  . Smoking status: Former Smoker    Packs/day: 1.00    Types: Cigarettes    Start date: 12/20/1976    Quit date: 12/20/1980  . Smokeless tobacco: Never Used  . Alcohol use No  . Drug use: No  .  Sexual activity: Not Currently   Other Topics Concern  . Not on file   Social History Narrative   Retired Web designer   Three children  [place], [place] and [employer] - 1, and one on the way    Family History  Problem Relation Age of Onset  . Cancer Mother     uterine  . Migraines Father   . Stroke Father     mini  . Early death Father 3    suicide   . Migraines Brother   . Drug abuse Son     addict - stable on suboxone  . Mental illness Son   . Cancer Maternal Grandmother     breast  . Alcohol abuse Paternal Grandfather   . Early death Paternal Aunt     suicide  . Mental illness  Paternal Aunt     Review of Systems  Constitutional: Negative for chills, fever and weight loss.  HENT: Negative for congestion and hearing loss.   Eyes: Negative for blurred vision and pain.  Respiratory: Negative for cough and shortness of breath.   Cardiovascular: Negative for chest pain and leg swelling.  Gastrointestinal: Negative for abdominal pain, constipation, diarrhea and heartburn.  Genitourinary: Negative for dysuria and frequency.  Musculoskeletal: Negative for falls, joint pain and myalgias.  Neurological: Positive for headaches. Negative for dizziness and seizures.       Migraine condition  Psychiatric/Behavioral: Positive for depression. The patient is nervous/anxious. The patient does not have insomnia.        Mostly irritability    BP 138/88 (BP Location: Right Arm, Patient Position: Sitting, Cuff Size: Normal)   Pulse 80   Temp (!) 96.8 F (36 C) (Temporal)   Resp 18   Ht 5\' 2"  (1.575 m)   Wt 172 lb 1.9 oz (78.1 kg)   SpO2 98%   BMI 31.48 kg/m   Physical Exam  Constitutional: She is oriented to person, place, and time. She appears well-developed and well-nourished.  Mildly overweight  HENT:  Head: Normocephalic and atraumatic.  Right Ear: External ear normal.  Left Ear: External ear normal.  Mouth/Throat: Oropharynx is clear and moist.  Eyes: Conjunctivae are normal. Pupils are equal, round, and reactive to light.  Neck: Normal range of motion. Neck supple. No thyromegaly present.  Cardiovascular: Normal rate, regular rhythm and normal heart sounds.   Pulmonary/Chest: Effort normal and breath sounds normal. No respiratory distress.  Lymphadenopathy:    She has no cervical adenopathy.  Neurological: She is alert and oriented to person, place, and time.  Gait normal  Skin: Skin is warm and dry.  Psychiatric: She has a normal mood and affect. Her behavior is normal. Thought content normal.  Mild anxiety  Nursing note and vitals  reviewed.  ASSESSMENT/PLAN:  1. Anxiety and depression We spent some time discussing anxiety and depression. I feel like she has some endogenous depression and will likely not do well off of an SSRI. She has a family history of alcoholism, drug addiction, and suicide. We discussed the importance of diet and exercise, sleep and taking care of herself in the management of depression. She denies need for counseling. She has an active family live with a daughter and granddaughter nearby. 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 - CBC - Comprehensive metabolic panel - Lipid panel - VITAMIN D 25 Hydroxy (Vit-D Deficiency, Fractures) - Urinalysis, Routine w reflex microscopic  2. Chronic hepatitis B ([place])  - Ambulatory referral to Gastroenterology  3. Lichen sclerosus of female genitalia Due for referral to gynecology  4. Chronic migraine Under the care of neurology  5. Encounter to establish care with new doctor Old records are requested  6. Age-related osteoporosis without current pathological fracture Osteoporosis by history. Greater than 50% of this visit was spent in counseling and coordinating care.  Total face to face time:  45 min   Patient Instructions  Need old records from [place] every day that you are able  I am prescribing Effexor Take one a day This is in place of Cymbalta  See me in a month Call sooner for problems  (Labs are ordered for future - not needed today)      Raylene Everts, MD

## 2017-04-04 ENCOUNTER — Encounter: Payer: Self-pay | Admitting: Family Medicine

## 2017-04-04 ENCOUNTER — Telehealth: Payer: Self-pay | Admitting: Family Medicine

## 2017-04-04 DIAGNOSIS — I700 Atherosclerosis of aorta: Secondary | ICD-10-CM | POA: Insufficient documentation

## 2017-04-04 NOTE — Telephone Encounter (Signed)
Called and spoke to Alyssa Durham, asking for an appt, made for wed at [facility].

## 2017-04-04 NOTE — Telephone Encounter (Signed)
Patient left message on voice mail.  She said that she forgot to mention that she had been having a sore throat for about 2 weeks. She says that she doesn't have a cold, so she has been taking tylenol because she just doesn't feel well.  She thought it would go away. Please advise.

## 2017-04-06 ENCOUNTER — Encounter: Payer: Self-pay | Admitting: Family Medicine

## 2017-04-06 ENCOUNTER — Ambulatory Visit (INDEPENDENT_AMBULATORY_CARE_PROVIDER_SITE_OTHER): Payer: PPO | Admitting: Family Medicine

## 2017-04-06 VITALS — BP 136/86 | HR 80 | Temp 97.4°F | Resp 18 | Ht 62.0 in | Wt 170.1 lb

## 2017-04-06 DIAGNOSIS — J029 Acute pharyngitis, unspecified: Secondary | ICD-10-CM

## 2017-04-06 LAB — POCT RAPID STREP A (OFFICE): RAPID STREP A SCREEN: NEGATIVE

## 2017-04-06 NOTE — Progress Notes (Signed)
Chief Complaint  Patient presents with  . Sore Throat    x 2 weeks   Here for an acute visit. She states she's had a sore throat for 3 weeks or so. It's worse some days than others. She has not any fever or chills. No cold symptoms. She hasn't had any runny stuffy nose. Mild postnasal drip. No itchy or watery eyes. No known allergies, however she's only lived in the area for short time. No coughing. No shortness of breath.  Patient doesn't have a history of GERD or acid reflux.  Patient Active Problem List   Diagnosis Date Noted  . Calcification of aorta ([facility]) 04/04/2017  . Anxiety and depression 03/29/2017  . Chronic hepatitis B ([place]) 03/29/2017  . Lichen sclerosus of female genitalia 03/29/2017  . Osteoporosis 03/29/2017  . Chronic migraine 08/16/2016    Outpatient Encounter Prescriptions as of 04/06/2017  Medication Sig  . Alendronate Sodium (FOSAMAX PO) Take by mouth.  . Clobetasol Prop Emollient Base 0.05 % emollient cream Apply topically 2 (two) times daily.  . RELPAX 40 MG tablet TAKE 1 TABLET BY MOUTH AT ONSET OF MIGRAINE. DO NOT TAKE MORE THAN 3 A WEEK  . venlafaxine XR (EFFEXOR XR) 37.5 MG 24 hr capsule Take 1 capsule (37.5 mg total) by mouth daily with breakfast.  . zolmitriptan (ZOMIG) 5 MG nasal solution Place 1 spray into the nose as needed for migraine.  Marland Kitchen zonisamide (ZONEGRAN) 100 MG capsule Take 2 capsules at night   No facility-administered encounter medications on file as of 04/06/2017.     Allergies  Allergen Reactions  . Codeine Nausea And Vomiting    Review of Systems  Constitutional: Negative for activity change, appetite change, chills and fever.  HENT: Positive for sore throat. Negative for congestion, postnasal drip, rhinorrhea, sinus pressure, sneezing and trouble swallowing.   Eyes: Negative for redness, itching and visual disturbance.  Respiratory: Negative for choking and shortness of breath.   Cardiovascular: Negative for chest pain and  palpitations.  Gastrointestinal: Negative for nausea and vomiting.  Musculoskeletal: Negative for arthralgias and myalgias.  Neurological: Negative for dizziness and headaches.    BP 136/86 (BP Location: Right Arm, Patient Position: Sitting, Cuff Size: Normal)   Pulse 80   Temp 97.4 F (36.3 C) (Temporal)   Resp 18   Ht 5\' 2"  (1.575 m)   Wt 170 lb 1.9 oz (77.2 kg)   SpO2 98%   BMI 31.12 kg/m   Physical Exam  Constitutional: She is oriented to person, place, and time. She appears well-developed and well-nourished. No distress.  HENT:  Head: Normocephalic and atraumatic.  Right Ear: External ear normal.  Left Ear: External ear normal.  Mouth/Throat: Oropharynx is clear and moist.  Posterior pharynx mildly injected. Nasal membranes are normal. No congestion.  Eyes: Conjunctivae are normal. Pupils are equal, round, and reactive to light.  Neck: Normal range of motion.  Cardiovascular: Normal rate and regular rhythm.   Pulmonary/Chest: Effort normal. She has no wheezes.  Lymphadenopathy:    She has no cervical adenopathy.  Neurological: She is alert and oriented to person, place, and time.  Psychiatric: She has a normal mood and affect. Her behavior is normal.  Mildly anxious    ASSESSMENT/PLAN:  1. Sore throat  - POCT rapid strep A= negative Discussed the etiology of chronic sore throat. Usually GERD or allergies. Will give a trial of allergy medicines see if this improves her symptoms. She may need an ENT if her  symptoms persist.  Patient Instructions  Take  Zyrtec ( cetirizine) 10 mg once a day for a couple of weeks May continue through spring allergy season if helpful Return as scheduled   Raylene Everts, MD

## 2017-04-06 NOTE — Patient Instructions (Signed)
Take  Zyrtec ( cetirizine) 10 mg once a day for a couple of weeks May continue through spring allergy season if helpful Return as scheduled

## 2017-04-08 ENCOUNTER — Emergency Department (HOSPITAL_COMMUNITY)
Admission: EM | Admit: 2017-04-08 | Discharge: 2017-04-08 | Disposition: A | Discharge status: Home / Self Care | Payer: PPO | Attending: Emergency Medicine | Admitting: Emergency Medicine

## 2017-04-08 ENCOUNTER — Encounter (HOSPITAL_COMMUNITY): Payer: Self-pay | Admitting: Emergency Medicine

## 2017-04-08 DIAGNOSIS — J45909 Unspecified asthma, uncomplicated: Secondary | ICD-10-CM | POA: Insufficient documentation

## 2017-04-08 DIAGNOSIS — S60222A Contusion of left hand, initial encounter: Secondary | ICD-10-CM | POA: Diagnosis not present

## 2017-04-08 DIAGNOSIS — Y929 Unspecified place or not applicable: Secondary | ICD-10-CM | POA: Insufficient documentation

## 2017-04-08 DIAGNOSIS — Y999 Unspecified external cause status: Secondary | ICD-10-CM | POA: Insufficient documentation

## 2017-04-08 DIAGNOSIS — S61412A Laceration without foreign body of left hand, initial encounter: Secondary | ICD-10-CM | POA: Insufficient documentation

## 2017-04-08 DIAGNOSIS — Y939 Activity, unspecified: Secondary | ICD-10-CM | POA: Insufficient documentation

## 2017-04-08 DIAGNOSIS — X58XXXA Exposure to other specified factors, initial encounter: Secondary | ICD-10-CM | POA: Diagnosis not present

## 2017-04-08 DIAGNOSIS — T148XXA Other injury of unspecified body region, initial encounter: Secondary | ICD-10-CM

## 2017-04-08 DIAGNOSIS — Z87891 Personal history of nicotine dependence: Secondary | ICD-10-CM | POA: Insufficient documentation

## 2017-04-08 DIAGNOSIS — S60512A Abrasion of left hand, initial encounter: Secondary | ICD-10-CM

## 2017-04-08 DIAGNOSIS — S6992XA Unspecified injury of left wrist, hand and finger(s), initial encounter: Secondary | ICD-10-CM | POA: Diagnosis not present

## 2017-04-08 DIAGNOSIS — Z79899 Other long term (current) drug therapy: Secondary | ICD-10-CM | POA: Insufficient documentation

## 2017-04-08 LAB — CBC WITH DIFFERENTIAL/PLATELET
BASOS ABS: 0 10*3/uL (ref 0.0–0.1)
BASOS PCT: 1 %
EOS PCT: 1 %
Eosinophils Absolute: 0.1 10*3/uL (ref 0.0–0.7)
HCT: 45.1 % (ref 36.0–46.0)
Hemoglobin: 15.1 g/dL — ABNORMAL HIGH (ref 12.0–15.0)
LYMPHS PCT: 14 %
Lymphs Abs: 1 10*3/uL (ref 0.7–4.0)
MCH: 30.6 pg (ref 26.0–34.0)
MCHC: 33.5 g/dL (ref 30.0–36.0)
MCV: 91.5 fL (ref 78.0–100.0)
Monocytes Absolute: 0.4 10*3/uL (ref 0.1–1.0)
Monocytes Relative: 6 %
Neutro Abs: 5.2 10*3/uL (ref 1.7–7.7)
Neutrophils Relative %: 78 %
PLATELETS: 225 10*3/uL (ref 150–400)
RBC: 4.93 MIL/uL (ref 3.87–5.11)
RDW: 14 % (ref 11.5–15.5)
WBC: 6.7 10*3/uL (ref 4.0–10.5)

## 2017-04-08 MED ORDER — BACITRACIN ZINC 500 UNIT/GM EX OINT
TOPICAL_OINTMENT | CUTANEOUS | Status: AC
  Filled 2017-04-08: qty 0.9

## 2017-04-08 NOTE — Discharge Instructions (Signed)
Keep your wounds clean and dry.  Applying ice to the swelling for 10 minutes several times daily for the next 1-2 days can help with swelling.  As discussed, I suspect this swelling will resolve over the next several days, but as it does, the bruise will widen any may deepen in color.  Expect complete resolution over the next 2 weeks.  Your lab tests are negative.  Please discuss your noticed increased bruising since being placed on Effexor to your primary doctor as this medicine can alter your platelet function. Your platelet count is normal today.

## 2017-04-08 NOTE — ED Triage Notes (Signed)
RECENTLY MOVED HERE FROM [place]- HAS HER DOG AND HER MOTHER HAS EXILED HER CAT TO OUTDOORS/INSIDE  CAT SAW DOG AND JUMPED CLAWING THE DORSUM OF PT'S LEFT HAND WITH ACTIVE BLEEDING   WOUND DRESSED- SMALL PUNCTURE WOUND WITH LARGE HEMATOMA

## 2017-04-10 NOTE — ED Provider Notes (Signed)
[facility] DEPT Provider Note   CSN: [id number] Arrival date & time: 04/08/17  1803     History   Chief Complaint Chief Complaint  Patient presents with  . Animal Bite    NOT BITE CLAWED    HPI Alyssa Durham is a 66 y.o. female presenting with a large swollen bruise on her left dorsal hand which occurred just prior to arrival.  She was holding her mothers cat who got startled and clawed her dorsal hand causing a small laceration which immediately bled copiously but also created a large dark swollen bruise on the hand, which has stopped bleeding since arrival here. She reports soreness and aching but no significant pain at this time and denies cat bite.  She has noticed increased bruising generally since being placed on effexor several weeks ago by her new pcp and wonders about possible SE of this medicine. Pressure application has stopped the bleeding.  She is utd with her tetanus vaccine.  HPI  Past Medical History:  Diagnosis Date  . Allergy   . Arthritis   . Asthma   . Cataract   . Chronic active type B viral hepatitis ([place])   . Depression   . Migraines   . Osteoporosis     Patient Active Problem List   Diagnosis Date Noted  . Calcification of aorta (HCC) 04/04/2017  . Anxiety and depression 03/29/2017  . Chronic hepatitis B ([place]) 03/29/2017  . Lichen sclerosus of female genitalia 03/29/2017  . Osteoporosis 03/29/2017  . Chronic migraine 08/16/2016    Past Surgical History:  Procedure Laterality Date  . CESAREAN SECTION    . URETHRAL DILATION    . WISDOM TOOTH EXTRACTION      OB History    No data available       Home Medications    Prior to Admission medications   Medication Sig Start Date End Date Taking? Authorizing Provider  Alendronate Sodium (FOSAMAX PO) Take by mouth.    Historical Provider, MD  Clobetasol Prop Emollient Base 0.05 % emollient cream Apply topically 2 (two) times daily.    Historical Provider, MD  RELPAX 40 MG tablet TAKE 1 TABLET  BY MOUTH AT ONSET OF MIGRAINE. DO NOT TAKE MORE THAN 3 A WEEK 03/14/17   Cameron Sprang, MD  venlafaxine XR (EFFEXOR XR) 37.5 MG 24 hr capsule Take 1 capsule (37.5 mg total) by mouth daily with breakfast. 03/29/17   Raylene Everts, MD  zolmitriptan (ZOMIG) 5 MG nasal solution Place 1 spray into the nose as needed for migraine. 03/14/17   Cameron Sprang, MD  zonisamide [facility]) 100 MG capsule Take 2 capsules at night 03/14/17   Cameron Sprang, MD    Family History Family History  Problem Relation Age of Onset  . Cancer Mother     uterine  . Migraines Father   . Stroke Father     mini  . Early death Father 83    suicide   . Migraines Brother   . Drug abuse Son     addict - stable on suboxone  . Mental illness Son   . Cancer Maternal Grandmother     breast  . Alcohol abuse Paternal Grandfather   . Early death Paternal Aunt     suicide  . Mental illness Paternal Aunt     Social History Social History  Substance Use Topics  . Smoking status: Former Smoker    Packs/day: 1.00    Types: Cigarettes  Start date: 12/20/1976    Quit date: 12/20/1980  . Smokeless tobacco: Never Used  . Alcohol use No     Allergies   Codeine   Review of Systems Review of Systems  Constitutional: Negative for chills and fever.  Musculoskeletal: Negative for arthralgias.  Skin: Positive for color change and wound.  Neurological: Negative for numbness.     Physical Exam Updated Vital Signs BP (!) 151/95 (BP Location: Right Arm)   Pulse 78   Temp 97.6 F (36.4 C) (Oral)   Resp 20   Ht 5\' 2"  (1.575 m)   Wt 77.1 kg   SpO2 98% Comment: Simultaneous filing. User may not have seen previous data.  BMI 31.09 kg/m   Physical Exam  Constitutional: She is oriented to person, place, and time. She appears well-developed and well-nourished.  HENT:  Head: Normocephalic.  Cardiovascular: Normal rate.   Pulmonary/Chest: Effort normal.  Musculoskeletal: She exhibits no tenderness.       Left  hand: She exhibits swelling. She exhibits no bony tenderness, normal capillary refill and no deformity.  Neurological: She is alert and oriented to person, place, and time. No sensory deficit.  Skin: Laceration noted.  4 cm soft, raised hematoma left dorsal hand.  Small puncture mid hematoma.  Lateral 0.25 cm laceration lateral to the hematoma site which is hemostatic and well approximated.       ED Treatments / Results  Labs (all labs ordered are listed, but only abnormal results are displayed) Labs Reviewed  CBC WITH DIFFERENTIAL/PLATELET - Abnormal; Notable for the following:       Result Value   Hemoglobin 15.1 (*)    All other components within normal limits    EKG  EKG Interpretation None       Radiology No results found.  Procedures Procedures (including critical care time)  Medications Ordered in ED Medications - No data to display   Initial Impression / Assessment and Plan / ED Course  I have reviewed the triage vital signs and the nursing notes.  Pertinent labs & imaging results that were available during my care of the patient were reviewed by me and considered in my medical decision making (see chart for details).     Wound care provided, sterile strip provided by RN to the small lateral wound. Platetelet dysfunction is listed as possible SE of effexor.  Discussed with pt who will talk to pcp about possible med change, platelet count normal today.  Advised continued dressing to protect the site, ice, elevation for the next 2 days, may add heat in several days which can hasten recovery. Prn f/u anticipated.  Final Clinical Impressions(s) / ED Diagnoses   Final diagnoses:  Abrasion of left hand, initial encounter  Hematoma    New Prescriptions Discharge Medication List as of 04/08/2017 10:11 PM       Evalee Jefferson, PA-C 04/10/17 [place], MD 04/13/17 [phone]

## 2017-04-14 ENCOUNTER — Telehealth: Payer: Self-pay | Admitting: Neurology

## 2017-04-14 NOTE — Telephone Encounter (Signed)
Patient  Needs a new rx on the zonisamide 100mg  she is now taking two a day. She would like it called into [facility]. She will be ut of it tomorrow night patient phone number is [phone]

## 2017-04-14 NOTE — Telephone Encounter (Signed)
She still has 6 refills available

## 2017-04-15 NOTE — Telephone Encounter (Signed)
VM-PTleft a messge  regarding the zonisamide/Dawn

## 2017-04-18 NOTE — Telephone Encounter (Signed)
Returned pt call.  Pt states that she was having issues with insurance covering her medication.  She stated that she was able to "get it figured out" and she now has her medication.

## 2017-04-28 ENCOUNTER — Ambulatory Visit: Payer: PPO | Admitting: Family Medicine

## 2017-05-03 DIAGNOSIS — F418 Other specified anxiety disorders: Secondary | ICD-10-CM | POA: Diagnosis not present

## 2017-05-03 LAB — URINALYSIS, ROUTINE W REFLEX MICROSCOPIC
Bilirubin Urine: NEGATIVE
Glucose, UA: NEGATIVE
Hgb urine dipstick: NEGATIVE
Ketones, ur: NEGATIVE
Leukocytes, UA: NEGATIVE
NITRITE: NEGATIVE
PROTEIN: NEGATIVE
Specific Gravity, Urine: 1.018 (ref 1.001–1.035)
pH: 6 (ref 5.0–8.0)

## 2017-05-03 LAB — COMPREHENSIVE METABOLIC PANEL
ALT: 20 U/L (ref 6–29)
AST: 25 U/L (ref 10–35)
Albumin: 4.1 g/dL (ref 3.6–5.1)
Alkaline Phosphatase: 72 U/L (ref 33–130)
BILIRUBIN TOTAL: 0.7 mg/dL (ref 0.2–1.2)
BUN: 17 mg/dL (ref 7–25)
CO2: 26 mmol/L (ref 20–31)
CREATININE: 0.86 mg/dL (ref 0.50–0.99)
Calcium: 9.2 mg/dL (ref 8.6–10.4)
Chloride: 108 mmol/L (ref 98–110)
GLUCOSE: 90 mg/dL (ref 65–99)
POTASSIUM: 4.4 mmol/L (ref 3.5–5.3)
SODIUM: 143 mmol/L (ref 135–146)
Total Protein: 6.8 g/dL (ref 6.1–8.1)

## 2017-05-03 LAB — CBC
HCT: 44.2 % (ref 35.0–45.0)
Hemoglobin: 14.8 g/dL (ref 11.7–15.5)
MCH: 30.5 pg (ref 27.0–33.0)
MCHC: 33.5 g/dL (ref 32.0–36.0)
MCV: 91.1 fL (ref 80.0–100.0)
MPV: 10.1 fL (ref 7.5–12.5)
PLATELETS: 270 10*3/uL (ref 140–400)
RBC: 4.85 MIL/uL (ref 3.80–5.10)
RDW: 14.2 % (ref 11.0–15.0)
WBC: 4.3 10*3/uL (ref 3.8–10.8)

## 2017-05-03 LAB — LIPID PANEL
Cholesterol: 205 mg/dL — ABNORMAL HIGH (ref <200)
HDL: 65 mg/dL (ref >50)
LDL Cholesterol: 124 mg/dL — ABNORMAL HIGH (ref <100)
Total CHOL/HDL Ratio: 3.2 Ratio (ref <5.0)
Triglycerides: 81 mg/dL (ref <150)
VLDL: 16 mg/dL (ref <30)

## 2017-05-04 ENCOUNTER — Encounter: Payer: Self-pay | Admitting: Family Medicine

## 2017-05-04 LAB — VITAMIN D 25 HYDROXY (VIT D DEFICIENCY, FRACTURES): VIT D 25 HYDROXY: 25 ng/mL — AB (ref 30–100)

## 2017-05-05 DIAGNOSIS — L820 Inflamed seborrheic keratosis: Secondary | ICD-10-CM | POA: Diagnosis not present

## 2017-05-12 ENCOUNTER — Ambulatory Visit (INDEPENDENT_AMBULATORY_CARE_PROVIDER_SITE_OTHER): Payer: PPO | Admitting: Family Medicine

## 2017-05-12 ENCOUNTER — Encounter: Payer: Self-pay | Admitting: Family Medicine

## 2017-05-12 VITALS — BP 118/76 | HR 80 | Temp 96.7°F | Resp 18 | Ht 62.0 in | Wt 172.0 lb

## 2017-05-12 DIAGNOSIS — E785 Hyperlipidemia, unspecified: Secondary | ICD-10-CM | POA: Insufficient documentation

## 2017-05-12 DIAGNOSIS — B181 Chronic viral hepatitis B without delta-agent: Secondary | ICD-10-CM | POA: Diagnosis not present

## 2017-05-12 DIAGNOSIS — Z780 Asymptomatic menopausal state: Secondary | ICD-10-CM | POA: Diagnosis not present

## 2017-05-12 DIAGNOSIS — I4510 Unspecified right bundle-branch block: Secondary | ICD-10-CM | POA: Insufficient documentation

## 2017-05-12 DIAGNOSIS — Z1231 Encounter for screening mammogram for malignant neoplasm of breast: Secondary | ICD-10-CM | POA: Diagnosis not present

## 2017-05-12 DIAGNOSIS — I700 Atherosclerosis of aorta: Secondary | ICD-10-CM | POA: Diagnosis not present

## 2017-05-12 DIAGNOSIS — E559 Vitamin D deficiency, unspecified: Secondary | ICD-10-CM | POA: Diagnosis not present

## 2017-05-12 DIAGNOSIS — F329 Major depressive disorder, single episode, unspecified: Secondary | ICD-10-CM

## 2017-05-12 DIAGNOSIS — F32A Depression, unspecified: Secondary | ICD-10-CM

## 2017-05-12 DIAGNOSIS — F419 Anxiety disorder, unspecified: Secondary | ICD-10-CM

## 2017-05-12 DIAGNOSIS — Z1239 Encounter for other screening for malignant neoplasm of breast: Secondary | ICD-10-CM

## 2017-05-12 DIAGNOSIS — Z8639 Personal history of other endocrine, nutritional and metabolic disease: Secondary | ICD-10-CM | POA: Insufficient documentation

## 2017-05-12 HISTORY — DX: Hyperlipidemia, unspecified: E78.5

## 2017-05-12 MED ORDER — FLUOXETINE HCL 10 MG PO TABS: 10.0000 mg | ORAL_TABLET | Freq: Every day | ORAL | 3 refills | Status: DC

## 2017-05-12 NOTE — Patient Instructions (Signed)
Stop the cymbalta Take the fluoxetine daily in the morning Start with 10 mg a day You may increase to 20 mg a day  Need lab testing in six month  I agree with your decision to diet and walk more  See me in 6 months( after labs )

## 2017-05-12 NOTE — Progress Notes (Signed)
Chief Complaint  Patient presents with  . Follow-up    1 month  at last visit was eager to come off of duloxetine so was prescribed effexor.  She had an episode of an injury to the dorsum of her hand that caused a large hematoma, and looked up bruising and effexor.  She believes the med was to blame and stopped it, went back on duloxetine.  Actually in her old chart there is mention of her complaining of easy bruising in years past.  In any event, still eager to go off of the duloxetine, she is prescribed low dose fluoxitine.  She requests a mammogram and dexa.  Her dexa was in 2017, not needed. She requests an appt for a PAP.  I explained to her that PAPs are not indicated in women over 65 with normal PAP history.  She tells me she has HPV+ on her pap a few years ago, then the next one was neg.  She has lichen sclerosis - so is referred to GYN Needs referral for Hep B + status Do not have records from last colonocscopy Recent labs discussed. Vitamin D is mildly low Cholesterol borderline high.  Has a good HDL and ratio  Patient Active Problem List   Diagnosis Date Noted  . HLD (hyperlipidemia) 05/12/2017  . History of vitamin D deficiency 05/12/2017  . History of non anemic vitamin B12 deficiency 05/12/2017  . RBBB 05/12/2017  . Calcification of aorta (HCC) 04/04/2017  . Anxiety and depression 03/29/2017  . Chronic hepatitis B ([place]) 03/29/2017  . Lichen sclerosus of female genitalia 03/29/2017  . Osteoporosis 03/29/2017  . Chronic migraine 08/16/2016    Outpatient Encounter Prescriptions as of 05/12/2017  Medication Sig  . Alendronate Sodium (FOSAMAX PO) Take by mouth.  . Clobetasol Prop Emollient Base 0.05 % emollient cream Apply topically 2 (two) times daily.  . RELPAX 40 MG tablet TAKE 1 TABLET BY MOUTH AT ONSET OF MIGRAINE. DO NOT TAKE MORE THAN 3 A WEEK  . zolmitriptan (ZOMIG) 5 MG nasal solution Place 1 spray into the nose as needed for migraine.  Marland Kitchen zonisamide (ZONEGRAN)  100 MG capsule Take 2 capsules at night  . FLUoxetine (PROZAC) 10 MG tablet Take 1 tablet (10 mg total) by mouth daily.   No facility-administered encounter medications on file as of 05/12/2017.     Allergies  Allergen Reactions  . Codeine Nausea And Vomiting    Review of Systems  Constitutional: Negative for activity change, appetite change and unexpected weight change.  HENT: Negative for congestion, dental problem, postnasal drip and rhinorrhea.   Eyes: Negative for redness and visual disturbance.  Respiratory: Negative for cough and shortness of breath.   Cardiovascular: Negative for chest pain, palpitations and leg swelling.  Gastrointestinal: Negative for abdominal pain, constipation and diarrhea.  Genitourinary: Negative for difficulty urinating, frequency and vaginal bleeding.  Musculoskeletal: Negative for arthralgias and back pain.  Neurological: Negative for dizziness and headaches.  Psychiatric/Behavioral: Negative for dysphoric mood and sleep disturbance. The patient is nervous/anxious.     BP 118/76 (BP Location: Right Arm, Patient Position: Sitting, Cuff Size: Normal)   Pulse 80   Temp (!) 96.7 F (35.9 C) (Temporal)   Resp 18   Ht 5\' 2"  (1.575 m)   Wt 172 lb (78 kg)   SpO2 98%   BMI 31.46 kg/m   Physical Exam  Constitutional: She is oriented to person, place, and time. She appears well-developed and well-nourished. No distress.  HENT:  Head: Normocephalic and atraumatic.  Mouth/Throat: Oropharynx is clear and moist.  Eyes: Conjunctivae are normal. Pupils are equal, round, and reactive to light.  Neck: Normal range of motion. No thyromegaly present.  Cardiovascular: Normal rate, regular rhythm and normal heart sounds.   Pulmonary/Chest: Effort normal and breath sounds normal. She has no wheezes.  Musculoskeletal: Normal range of motion. She exhibits no edema.  Neurological: She is alert and oriented to person, place, and time.  Psychiatric: She has a normal  mood and affect. Her behavior is normal.  Nervous about health    ASSESSMENT/PLAN:  1. Anxiety and depression   2. Chronic hepatitis B ([place])  - Ambulatory referral to Gastroenterology  3. Hyperlipidemia, unspecified hyperlipidemia type  - CBC - COMPLETE METABOLIC PANEL WITH GFR - Lipid panel - Urinalysis, Routine w reflex microscopic  4. Calcification of aorta (HCC)   5. Vitamin D deficiency  - VITAMIN D 25 Hydroxy (Vit-D Deficiency, Fractures)  6. Post-menopausal   7. Screening for breast cancer  - MM Digital Screening; Future  Overweight.  Discussed diet, portions, snacking, low carb diet and exercise  Patient Instructions  Stop the cymbalta Take the fluoxetine daily in the morning Start with 10 mg a day You may increase to 20 mg a day  Need lab testing in six month  I agree with your decision to diet and walk more  See me in 6 months( after labs )   Raylene Everts, MD

## 2017-06-21 ENCOUNTER — Ambulatory Visit: Payer: PPO | Admitting: Neurology

## 2017-06-27 ENCOUNTER — Telehealth: Payer: Self-pay | Admitting: Neurology

## 2017-06-27 NOTE — Telephone Encounter (Signed)
PT called and left a message regarding her Botox and the appointment she has for it tomorrow and that the insurance company is making her pay unless she can pick th prescription up herself, she needs a call back on what to do because she will not make appointment if she has to pay

## 2017-06-27 NOTE — Telephone Encounter (Signed)
PT left another message saying no one will call her back in regards to a prescription for Botox so she said she guesses she should just cancel her appointment

## 2017-06-27 NOTE — Telephone Encounter (Signed)
This is the lady I was talking about earlier.  She has 1 approved visit..... Not sure about insurance or picking it up though.

## 2017-06-27 NOTE — Telephone Encounter (Signed)
I spoke with patient and informed her that we will cancel the appointment for tomorrow.  We will try to figure out the best way to get the Botox.  She is just now receiving a bill for over $200 for her last Botox.  She will see Dr. Delice Lesch on 07-05-17 and discuss other options with her at that time.

## 2017-06-27 NOTE — Telephone Encounter (Signed)
I don't know anything about this.  Do you?

## 2017-06-28 ENCOUNTER — Ambulatory Visit: Payer: PPO | Admitting: Neurology

## 2017-07-05 ENCOUNTER — Encounter: Payer: Self-pay | Admitting: Neurology

## 2017-07-05 ENCOUNTER — Ambulatory Visit (INDEPENDENT_AMBULATORY_CARE_PROVIDER_SITE_OTHER): Payer: PPO | Admitting: Neurology

## 2017-07-05 VITALS — BP 122/84 | HR 71 | Ht 62.0 in | Wt 171.0 lb

## 2017-07-05 DIAGNOSIS — G43709 Chronic migraine without aura, not intractable, without status migrainosus: Secondary | ICD-10-CM | POA: Diagnosis not present

## 2017-07-05 DIAGNOSIS — IMO0002 Reserved for concepts with insufficient information to code with codable children: Secondary | ICD-10-CM

## 2017-07-05 MED ORDER — ZONISAMIDE 100 MG PO CAPS: ORAL_CAPSULE | ORAL | 6 refills | Status: DC

## 2017-07-05 NOTE — Patient Instructions (Signed)
1. We will look into the Botox 2. Increase Zonisamide 100mg : Take 3 capsules at night 3. Continue all your other medications 4. Follow-up in 4 months, call for any changes

## 2017-07-05 NOTE — Progress Notes (Signed)
NEUROLOGY FOLLOW UP OFFICE NOTE  Alyssa Durham [id number]  HISTORY OF PRESENT ILLNESS: I had the pleasure of seeing Alyssa Durham in follow-up in the neurology clinic on 07/05/2017.  The patient was last seen 4 months ago for chronic migraines and memory loss. She feels the Botox has caused a decrease in her headaches but also states it is hard to say. Her lat Botox was in APril. She would go a week without migraines, but most mornings around 2-3 times a week she continues to have migraines with good response to Relpax. She has one now but was up late last night. The Relpax makes her drowsy. Zonisamide dose was increased to 200mg  qhs on last visit, no side effects. She denies any diplopia, vision changes, dizziness, no recent falls. She fell on her right knee a year ago and still has difficulty kneeling on her right knee.  HPI 08/16/2016: This is a 66 yo RH woman with a history of chronic migraines, depression. She moved from [place] 2 months ago, records from her previous neurologist were reviewed. She has had migraines since 1969. Migraines now are mostly localized over the right hemisphere, if she closes her eyes, she sees flashing lights, but no prior aura. This is associated with nausea, vomiting, photo and phonophobia. She has 4-5 migraine days a week, taking 1/2 to 2 tablets of Relpax at the onset, which does help ease progression of symptoms. For the past 4 years, she has had migraines upon awakening. Migraines were fairly well-controlled until 1993 when she was started on a trial of an unrecalled antidepressant which helped. In December 2016, headaches worsened and she was started on Lexapro, which helped with headaches but caused weight gain and hair loss. She tried Celexa (weight gain), Zoloft (weight gain), Wellbutrin. She also tried Topamax but stopped due to risk for kidney stones. She recalls taking Inderal but cannot recall effects/side effects. Most recently, she was tried on nortriptyline,  which did not help with the headaches, but helped her mood. She is now on Cymbalta for arthritis, which also helps with her mood. For rescue, she has tried sumatriptan, Maxalt, and Zomig, with no effect. Relpax and Frova helped the most, causing less palpitations. She has noticed stress to be a big trigger, as well a chocolates. She usually sleeps good. There is a family history of migraines in her father, brother, and niece. She had received 2 rounds of Botox injections for chronic migraine, the first one helped very well with much less use of Relpax. Last Botox was in April 2017 before her move.   She is also reporting memory loss. She had told her neurologist about this in 1994. Memory issues are more for long-term memory, she cannot recall what happened years ago with her family, which concerns her because she cannot recall her children growing up. Short-term memory is pretty good, she denies getting lost driving, no missed medications or bill payments. She denies any dizziness, diplopia, dysarthria, dysphagia, neck pain, bowel/bladder dysfunction. She has floaters in her left eye.   Diagnostic Data: MRI brain without contrast done 04/17/15 at [facility] in [place] did not show any acute changes, there was subtle white matter hyperintensity in the posterior left occipital lobe. Compared to 2007 study, this is only minimally progressive. EEG done 04/18/15 in [place] was within normal limits.  Prior Preventative Medications: Zoloft, Topamax, Inderal, nortriptyline, Cymbalta, Botox Prior Rescue medications: Relpax, sumatriptan, maxalt, Zomig, Frova, Amerge (did help) Has not tried: gabapentin, depakote  PAST MEDICAL HISTORY: Past Medical History:  Diagnosis Date  . Allergy   . Arthritis   . Asthma   . Cataract   . Chronic active type B viral hepatitis ([place])   . Depression   . HLD (hyperlipidemia) 05/12/2017  . Migraines   . Osteoporosis     MEDICATIONS: Current Outpatient  Prescriptions on File Prior to Visit  Medication Sig Dispense Refill  . Alendronate Sodium (FOSAMAX PO) Take by mouth.    . Clobetasol Prop Emollient Base 0.05 % emollient cream Apply topically 2 (two) times daily.    Marland Kitchen FLUoxetine (PROZAC) 10 MG tablet Take 1 tablet (10 mg total) by mouth daily. 90 tablet 3  . RELPAX 40 MG tablet TAKE 1 TABLET BY MOUTH AT ONSET OF MIGRAINE. DO NOT TAKE MORE THAN 3 A WEEK 12 tablet 11  . zolmitriptan (ZOMIG) 5 MG nasal solution Place 1 spray into the nose as needed for migraine. 6 Units 0  . zonisamide (ZONEGRAN) 100 MG capsule Take 2 capsules at night 60 capsule 6   No current facility-administered medications on file prior to visit.     ALLERGIES: Allergies  Allergen Reactions  . Codeine Nausea And Vomiting    FAMILY HISTORY: Family History  Problem Relation Age of Onset  . Cancer Mother        uterine  . Migraines Father   . Stroke Father        mini  . Early death Father 84       suicide   . Migraines Brother   . Drug abuse Son        addict - stable on suboxone  . Mental illness Son   . Cancer Maternal Grandmother        breast  . Alcohol abuse Paternal Grandfather   . Early death Paternal Aunt        suicide  . Mental illness Paternal Aunt     SOCIAL HISTORY: Social History   Social History  . Marital status: Divorced    Spouse name: N/A  . Number of children: 3  . Years of education: 14   Occupational History  . retired     Chiropractor   Social History Main Topics  . Smoking status: Former Smoker    Packs/day: 1.00    Types: Cigarettes    Start date: 12/20/1976    Quit date: 12/20/1980  . Smokeless tobacco: Never Used  . Alcohol use No  . Drug use: No  . Sexual activity: Not Currently   Other Topics Concern  . Not on file   Social History Narrative   Retired Web designer   Three children  [place], [place] and [employer] - 1, and one on the way    REVIEW OF  SYSTEMS: Constitutional: No fevers, chills, or sweats, no generalized fatigue, change in appetite Eyes: No visual changes, double vision, eye pain Ear, nose and throat: No hearing loss, ear pain, nasal congestion, sore throat Cardiovascular: No chest pain, palpitations Respiratory:  No shortness of breath at rest or with exertion, wheezes GastrointestinaI: No nausea, vomiting, diarrhea, abdominal pain, fecal incontinence Genitourinary:  No dysuria, urinary retention or frequency Musculoskeletal:  No neck pain, back pain Integumentary: No rash, pruritus, skin lesions Neurological: as above Psychiatric: No depression, insomnia, anxiety Endocrine: No palpitations, fatigue, diaphoresis, mood swings, change in appetite, change in weight, increased thirst Hematologic/Lymphatic:  No anemia, purpura, petechiae. Allergic/Immunologic: no itchy/runny eyes, nasal congestion, recent allergic reactions, rashes  PHYSICAL EXAM: Vitals:   07/05/17 1458  BP: 122/84  Pulse: 71   General: No acute distress Head:  Normocephalic/atraumatic Neck: supple, no paraspinal tenderness, full range of motion Heart:  Regular rate and rhythm Lungs:  Clear to auscultation bilaterally Back: No paraspinal tenderness Skin/Extremities: No rash, no edema Neurological Exam: alert and oriented to person, place, and time. No aphasia or dysarthria. Fund of knowledge is appropriate.  Recent and remote memory are intact.  Attention and concentration are normal.    Able to name objects and repeat phrases. Cranial nerves: Pupils equal, round, reactive to light.  Fundoscopic exam unremarkable, no papilledema. Extraocular movements intact with no nystagmus. Visual fields full. Facial sensation intact. No facial asymmetry. Tongue, uvula, palate midline.  Motor: Bulk and tone normal, muscle strength 5/5 throughout with no pronator drift.  Sensation to light touch intact.  No extinction to double simultaneous stimulation.  Deep tendon  reflexes brisk  2+ throughout, toes downgoing.  Finger to nose testing intact.  Gait narrow-based and steady, able to tandem walk adequately.  Romberg negative.  IMPRESSION: This is a 66 yo RH woman with chronic migraines. She presented with 4-5 migraines a week, she has had good response to Botox, with migraines around 2-3 a week. Continue Botox. Increase Zonisamide to 300mg  qhs. She has not tried gabapentin or Depakote, these may be options in the future. Aimovig is also a potential option. She knows to minimize Relpax to 2-3 a week to avoid rebound headaches. She will keep a calendar of her migraines and follow-up in 4 months.   Thank you for allowing me to participate in her care.  Please do not hesitate to call for any questions or concerns.  The duration of this appointment visit was 25 minutes of face-to-face time with the patient.  Greater than 50% of this time was spent in counseling, explanation of diagnosis, planning of further management, and coordination of care.   Ellouise Newer, M.D.  CC: Dr. Blanchie Serve

## 2017-07-12 ENCOUNTER — Telehealth: Payer: Self-pay

## 2017-07-12 ENCOUNTER — Telehealth: Payer: Self-pay | Admitting: Neurology

## 2017-07-12 MED ORDER — ALBUTEROL SULFATE HFA 108 (90 BASE) MCG/ACT IN AERS: 2.0000 | INHALATION_SPRAY | Freq: Four times a day (QID) | RESPIRATORY_TRACT | 0 refills | Status: DC | PRN

## 2017-07-12 NOTE — Telephone Encounter (Signed)
Seen [phone]    Pt asking about her gi referral

## 2017-07-12 NOTE — Telephone Encounter (Signed)
Please call pt about a rx refill for her inhaler also she wants to follow up on the gastro referral.

## 2017-07-12 NOTE — Telephone Encounter (Signed)
Ask Alyssa Durham who is doing Botox.

## 2017-07-12 NOTE — Telephone Encounter (Signed)
Patient states that she needs to talk to [facility] about her botox and what was talked about in the appt with dr Delice Lesch

## 2017-07-13 NOTE — Telephone Encounter (Signed)
Rx sent to [facility].  Pt scheduled for Botox with Dr. Tomi Likens on 9/21.

## 2017-07-13 NOTE — Telephone Encounter (Signed)
Pls schedule on Dr. Georgie Chard botox days, she is the patient we had talked about that insurance would pay if she gets the Botox from pharmacy. I think we told her that we would send the Rx to the specialty pharmacy and they would call her? Pls check with Jonnie Kind if that is the case, thanks

## 2017-07-14 NOTE — Telephone Encounter (Signed)
Called pt to give her the information below.  No answer.

## 2017-07-15 NOTE — Telephone Encounter (Signed)
Spoke with pt.  Gave her Botox appointment information.  Pt was not happy that I scheduled her so far out.  Explained that Dr. Tomi Likens only does Botox one day a month and that his August schedule was full.  She did not seem to understand this, she kept saying that she use to get Botox injections every 3 months and that she was due for another injection the first week of July.  I explained that I could not schedule her appointment until her visit was authorized by her insurance, and that I scheduled as soon as it was.  She asked about how to get the Botox and I let her know that we have sent the Rx to the specialty pharmacy with a request that the Botox be shipped to our office.  Pt was irate about this stating that Dr. Delice Lesch should have told me that it needs to be sent to her (the pt) and that she needs to pay over the phone for it before it will be shipped.  She is demanding a return call from Dr. Delice Lesch.  I let her know that Dr. Delice Lesch is out of the office and will not be back until Monday, but I would be more than happy to send her the message.

## 2017-07-15 NOTE — Telephone Encounter (Signed)
Patient returning call to the office. Please call. Thanks

## 2017-07-20 ENCOUNTER — Telehealth: Payer: Self-pay | Admitting: Family Medicine

## 2017-07-20 NOTE — Telephone Encounter (Signed)
Patient left message on nurse line. She would like to know status of GI referral. She states she is overdue for testing.  Chart shows referral cancelled.

## 2017-07-21 ENCOUNTER — Encounter (INDEPENDENT_AMBULATORY_CARE_PROVIDER_SITE_OTHER): Payer: Self-pay | Admitting: Internal Medicine

## 2017-07-21 NOTE — Telephone Encounter (Signed)
I fixed it so we could see it in our WQ, for some reason we never saw it when it was first placed. [facility] will be contacting the patient to schedule appt. Thanks, Lelon Frohlich

## 2017-07-21 NOTE — Telephone Encounter (Signed)
I see 2 referrals - the one to Dr Augusto Gamble was cancelled, and the one to Dr Laural Golden is AUTH.  Do not know current status.  Please call Dr Laural Golden and see if she was scheduled.

## 2017-07-25 ENCOUNTER — Telehealth: Payer: Self-pay | Admitting: Family Medicine

## 2017-07-25 NOTE — Telephone Encounter (Signed)
Patient calling in ref to referral to [facility].  Has not heard anything from any office.  She states there is a particular doctor that Dr Meda Coffee wanted her to see.  She was waiting for a call back from the nurse after the asthma Rx was sent in.

## 2017-07-26 NOTE — Telephone Encounter (Signed)
Patient is scheduled for OV 08/08/17 with our NP Terri, Dr Laural Golden is only in office 1 day a week and he is booking in December, Terri sees most of new patients, [facility] mailed patient a letter on 07/21/17 advising her of appt date and time

## 2017-07-26 NOTE — Telephone Encounter (Signed)
Called patient regarding message below. No answer, left generic message for patient to return call.   

## 2017-08-04 NOTE — Telephone Encounter (Signed)
Rcvd Botox-(1) 200u vial

## 2017-08-08 ENCOUNTER — Ambulatory Visit (INDEPENDENT_AMBULATORY_CARE_PROVIDER_SITE_OTHER): Payer: PPO | Admitting: Internal Medicine

## 2017-08-08 ENCOUNTER — Encounter (INDEPENDENT_AMBULATORY_CARE_PROVIDER_SITE_OTHER): Payer: Self-pay | Admitting: Internal Medicine

## 2017-08-08 ENCOUNTER — Encounter (INDEPENDENT_AMBULATORY_CARE_PROVIDER_SITE_OTHER): Payer: Self-pay | Admitting: *Deleted

## 2017-08-08 VITALS — BP 120/80 | HR 60 | Temp 98.0°F | Ht 62.0 in | Wt 169.3 lb

## 2017-08-08 DIAGNOSIS — B181 Chronic viral hepatitis B without delta-agent: Secondary | ICD-10-CM | POA: Diagnosis not present

## 2017-08-08 NOTE — Patient Instructions (Signed)
Labs and US. 

## 2017-08-08 NOTE — Progress Notes (Signed)
   Subjective:    Patient ID: Alyssa Durham, female    DOB: 1951/04/18, 66 y.o.   MRN: [id number]  HPI Referred by Dr. Meda Coffee for chronic Hepatitis B . Diagnosed in 1993. Previously  followed by Dr. [facility] in [place].   From records she failed a trial of interferon in the early 90s.  She says every 6 months she has a [place] and  Hep B quaint.  No hx of IV drugs. 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  She is doing good.  Appetite is good. No weight loss.  She has a BM daily. No melena or BRRB   04/22/2016 HCV Fibersure Results. Fibrosis score 0.43 high.  Fibrosis Stage F1-F2.   03/16/2016 Hand H 15.6 and 45.7. MCV 89, total bili 0.7. ALP 92, AST 31, ALT 25 03/20/2016 AFP 4.1 03/16/2017 Hep B Surface AB Qual. Non reactive. HBsAg screen: positive  07/26/2015 Hepatitis B Virus DNA 897, Hepatitis B Virus DNA 2.95.  03/04/2016 US abdomen: Mild fatty infiltration of the liver. No evidence of GB or biliary pathology.   Review of Systems Past Medical History:  Diagnosis Date  . Allergy   . Arthritis   . Asthma   . Cataract   . Chronic active type B viral hepatitis ([place])   . Depression   . HLD (hyperlipidemia) 05/12/2017  . Migraines   . Osteoporosis     Past Surgical History:  Procedure Laterality Date  . CESAREAN SECTION    . URETHRAL DILATION    . WISDOM TOOTH EXTRACTION      Allergies  Allergen Reactions  . Codeine Nausea And Vomiting    Current Outpatient Prescriptions on File Prior to Visit  Medication Sig Dispense Refill  . albuterol (PROVENTIL HFA;VENTOLIN HFA) 108 (90 Base) MCG/ACT inhaler Inhale 2 puffs into the lungs every 6 (six) hours as needed for wheezing or shortness of breath. 18 g 0  . FLUoxetine (PROZAC) 10 MG tablet Take 1 tablet (10 mg total) by mouth daily. 90 tablet 3  . RELPAX 40 MG tablet TAKE 1 TABLET BY MOUTH AT ONSET OF MIGRAINE. DO NOT TAKE MORE THAN 3 A WEEK 12 tablet 11  . Clobetasol Prop Emollient Base  0.05 % emollient cream Apply topically 2 (two) times daily.     No current facility-administered medications on file prior to visit.         Objective:   Physical Exam  Blood pressure 120/80, pulse 60, temperature 98 F (36.7 C), height 5\' 2"  (1.575 m), weight 169 lb 4.8 oz (76.8 kg). Alert and oriented. Skin warm and dry. Oral mucosa is moist.   . Sclera anicteric, conjunctivae is pink. Thyroid not enlarged. No cervical lymphadenopathy. Lungs clear. Heart regular rate and rhythm.  Abdomen is soft. Bowel sounds are positive. No hepatomegaly. No abdominal masses felt. No tenderness.  No edema to lower extremities.          Assessment & Plan:  Hepatitis B. Will get labs today. OV in  6 months CBC, Hep B quaint, Hepatic, AFP, Hep B surface antigen, Hep B core antibody IgM,  US abdomen

## 2017-08-11 DIAGNOSIS — B181 Chronic viral hepatitis B without delta-agent: Secondary | ICD-10-CM | POA: Diagnosis not present

## 2017-08-12 ENCOUNTER — Ambulatory Visit (HOSPITAL_COMMUNITY)
Admission: RE | Admit: 2017-08-12 | Discharge: 2017-08-12 | Disposition: A | Discharge status: Home / Self Care | Payer: PPO | Source: Ambulatory Visit | Attending: Internal Medicine | Admitting: Internal Medicine

## 2017-08-12 DIAGNOSIS — B181 Chronic viral hepatitis B without delta-agent: Secondary | ICD-10-CM | POA: Insufficient documentation

## 2017-08-12 LAB — CBC WITH DIFFERENTIAL/PLATELET
BASOS ABS: 48 {cells}/uL (ref 0–200)
Basophils Relative: 1 %
EOS ABS: 96 {cells}/uL (ref 15–500)
Eosinophils Relative: 2 %
HEMATOCRIT: 44.5 % (ref 35.0–45.0)
Hemoglobin: 15 g/dL (ref 11.7–15.5)
LYMPHS PCT: 13 %
Lymphs Abs: 624 cells/uL — ABNORMAL LOW (ref 850–3900)
MCH: 30.6 pg (ref 27.0–33.0)
MCHC: 33.7 g/dL (ref 32.0–36.0)
MCV: 90.8 fL (ref 80.0–100.0)
MONO ABS: 576 {cells}/uL (ref 200–950)
MPV: 10.6 fL (ref 7.5–12.5)
Monocytes Relative: 12 %
NEUTROS PCT: 72 %
Neutro Abs: 3456 cells/uL (ref 1500–7800)
Platelets: 278 10*3/uL (ref 140–400)
RBC: 4.9 MIL/uL (ref 3.80–5.10)
RDW: 14.3 % (ref 11.0–15.0)
WBC: 4.8 10*3/uL (ref 3.8–10.8)

## 2017-08-12 LAB — HEPATIC FUNCTION PANEL
ALT: 18 U/L (ref 6–29)
AST: 25 U/L (ref 10–35)
Albumin: 4.2 g/dL (ref 3.6–5.1)
Alkaline Phosphatase: 92 U/L (ref 33–130)
BILIRUBIN DIRECT: 0.1 mg/dL (ref <=0.2)
BILIRUBIN INDIRECT: 0.5 mg/dL (ref 0.2–1.2)
Total Bilirubin: 0.6 mg/dL (ref 0.2–1.2)
Total Protein: 6.7 g/dL (ref 6.1–8.1)

## 2017-08-12 LAB — HEPATITIS B CORE ANTIBODY, IGM: HEP B C IGM: NONREACTIVE

## 2017-08-12 LAB — HEPATITIS B SURFACE ANTIGEN: Hepatitis B Surface Ag: REACTIVE — AB

## 2017-08-12 LAB — AFP TUMOR MARKER: AFP-Tumor Marker: 4.5 ng/mL (ref <6.1)

## 2017-08-14 LAB — HEPATITIS B DNA, ULTRAQUANTITATIVE, PCR
Hepatitis B DNA (Calc): 3.18 Log IU/mL — ABNORMAL HIGH
Hepatitis B DNA: 1530 IU/mL — ABNORMAL HIGH

## 2017-08-29 ENCOUNTER — Telehealth: Payer: Self-pay

## 2017-08-29 NOTE — Telephone Encounter (Signed)
Left vm to reschedule awv due to provider schedule -nr

## 2017-08-31 ENCOUNTER — Ambulatory Visit: Payer: PPO

## 2017-09-05 ENCOUNTER — Ambulatory Visit: Payer: PPO

## 2017-09-09 ENCOUNTER — Ambulatory Visit (INDEPENDENT_AMBULATORY_CARE_PROVIDER_SITE_OTHER): Payer: PPO | Admitting: Neurology

## 2017-09-09 DIAGNOSIS — G43011 Migraine without aura, intractable, with status migrainosus: Secondary | ICD-10-CM | POA: Diagnosis not present

## 2017-09-09 MED ORDER — ONABOTULINUMTOXINA 100 UNITS IJ SOLR
200.0000 [IU] | Freq: Once | INTRAMUSCULAR | Status: AC
  Administered 2017-09-09: 200 [IU] via INTRAMUSCULAR

## 2017-09-09 NOTE — Progress Notes (Signed)
[facility]  ° °Procedure Note Botox ° °Attending: Dr. Jahzaria Vary ° °Preoperative Diagnosis(es): Chronic migraine ° °Consent obtained from: The patient °Benefits discussed included, but were not limited to decreased muscle tightness, increased joint range of motion, and decreased pain.  Risk discussed included, but were not limited pain and discomfort, bleeding, bruising, excessive weakness, venous thrombosis, muscle atrophy and dysphagia.  Anticipated outcomes of the procedure as well as he risks and benefits of the alternatives to the procedure, and the roles and tasks of the personnel to be involved, were discussed with the patient, and the patient consents to the procedure and agrees to proceed. A copy of the patient medication guide was given to the patient which explains the blackbox warning. ° °Patients identity and treatment sites confirmed Yes.  . ° °Details of Procedure: °Skin was cleaned with alcohol. Prior to injection, the needle plunger was aspirated to make sure the needle was not within a blood vessel.  There was no blood retrieved on aspiration.   ° °Following is a summary of the muscles injected  And the amount of Botulinum toxin used: ° °Dilution °200 units of Botox was reconstituted with 4 ml of preservative free normal saline. °Time of reconstitution: At the time of the office visit (<30 minutes prior to injection)  ° °Injections  °155 total units of Botox was injected with a 30 gauge needle. ° °Injection Sites: °L occipitalis: 15 units- 3 sites  °R occiptalis: 15 units- 3 sites ° °L upper trapezius: 15 units- 3 sites °R upper trapezius: 15 units- 3 sits          °L paraspinal: 10 units- 2 sites °R paraspinal: 10 units- 2 sites ° °Face °L frontalis(2 injection sites):10 units   °R frontalis(2 injection sites):10 units         °L corrugator: 5 units   °R corrugator: 5 units           °Procerus: 5 units   °L temporalis: 20 units °R temporalis: 20 units  ° °Agent:  °200 units of botulinum Type  A (Onobotulinum Toxin type A) was reconstituted with 4 ml of preservative free normal saline.  °Time of reconstitution: At the time of the office visit (<30 minutes prior to injection)  ° ° ° Total injected (Units):  155 ° Total wasted (Units):  45 ° °Patient tolerated procedure well without complications.   °Reinjection is anticipated in 3 months. ° ° °

## 2017-09-19 ENCOUNTER — Ambulatory Visit (INDEPENDENT_AMBULATORY_CARE_PROVIDER_SITE_OTHER): Payer: PPO

## 2017-09-19 VITALS — BP 130/82 | HR 64 | Temp 98.1°F | Ht 62.0 in | Wt 170.1 lb

## 2017-09-19 DIAGNOSIS — Z0000 Encounter for general adult medical examination without abnormal findings: Secondary | ICD-10-CM | POA: Diagnosis not present

## 2017-09-19 DIAGNOSIS — Z1211 Encounter for screening for malignant neoplasm of colon: Secondary | ICD-10-CM | POA: Diagnosis not present

## 2017-09-19 DIAGNOSIS — Z23 Encounter for immunization: Secondary | ICD-10-CM | POA: Diagnosis not present

## 2017-09-19 NOTE — Patient Instructions (Signed)
Alyssa Durham , Thank you for taking time to come for your Medicare Wellness Visit. I appreciate your ongoing commitment to your health goals. Please review the following plan we discussed and let me know if I can assist you in the future.   Screening recommendations/referrals: Colonoscopy: Cologuard ordered today  Mammogram: Due please call [facility] to schedule when you are ready.  Bone Density: Up to date  Recommended yearly ophthalmology/optometry visit for glaucoma screening and checkup Recommended yearly dental visit for hygiene and checkup  Vaccinations: Influenza vaccine: Administered today Pneumococcal vaccine: Pneumovax 23 administered today Tdap vaccine: Please check with previous PCP to see when last vaccine was administered Shingles vaccine: Completed    Advanced directives: Advance directive discussed with you today. I have provided a copy for you to complete at home and have notarized. Once this is complete please bring a copy in to our office so we can scan it into your chart.  Conditions/risks identified: Obese, recommend starting a routine exercise program at least 3 days a week for 30-45 minutes at a time as tolerated.   Next appointment: Follow up with Dr. Meda Coffee on 11/27 at 1:00 pm. Follow up in 1 year for your annual wellness visit.  Preventive Care 39 Years and Older, Female Preventive care refers to lifestyle choices and visits with your health care provider that can promote health and wellness. What does preventive care include?  A yearly physical exam. This is also called an annual well check.  Dental exams once or twice a year.  Routine eye exams. Ask your health care provider how often you should have your eyes checked.  Personal lifestyle choices, including:  Daily care of your teeth and gums.  Regular physical activity.  Eating a healthy diet.  Avoiding tobacco and drug use.  Limiting alcohol use.  Practicing safe sex.  Taking low-dose  aspirin every day.  Taking vitamin and mineral supplements as recommended by your health care provider. What happens during an annual well check? The services and screenings done by your health care provider during your annual well check will depend on your age, overall health, lifestyle risk factors, and family history of disease. Counseling  Your health care provider may ask you questions about your:  Alcohol use.  Tobacco use.  Drug use.  Emotional well-being.  Home and relationship well-being.  Sexual activity.  Eating habits.  History of falls.  Memory and ability to understand (cognition).  Work and work Statistician.  Reproductive health. Screening  You may have the following tests or measurements:  Height, weight, and BMI.  Blood pressure.  Lipid and cholesterol levels. These may be checked every 5 years, or more frequently if you are over 6 years old.  Skin check.  Lung cancer screening. You may have this screening every year starting at age 63 if you have a 30-pack-year history of smoking and currently smoke or have quit within the past 15 years.  Fecal occult blood test (FOBT) of the stool. You may have this test every year starting at age 85.  Flexible sigmoidoscopy or colonoscopy. You may have a sigmoidoscopy every 5 years or a colonoscopy every 10 years starting at age 73.  Hepatitis C blood test.  Hepatitis B blood test.  Sexually transmitted disease (STD) testing.  Diabetes screening. This is done by checking your blood sugar (glucose) after you have not eaten for a while (fasting). You may have this done every 1-3 years.  Bone density scan. This is done  to screen for osteoporosis. You may have this done starting at age 31.  Mammogram. This may be done every 1-2 years. Talk to your health care provider about how often you should have regular mammograms. Talk with your health care provider about your test results, treatment options, and if  necessary, the need for more tests. Vaccines  Your health care provider may recommend certain vaccines, such as:  Influenza vaccine. This is recommended every year.  Tetanus, diphtheria, and acellular pertussis (Tdap, Td) vaccine. You may need a Td booster every 10 years.  Zoster vaccine. You may need this after age 33.  Pneumococcal 13-valent conjugate (PCV13) vaccine. One dose is recommended after age 44.  Pneumococcal polysaccharide (PPSV23) vaccine. One dose is recommended after age 78. Talk to your health care provider about which screenings and vaccines you need and how often you need them. This information is not intended to replace advice given to you by your health care provider. Make sure you discuss any questions you have with your health care provider. Document Released: 01/02/2016 Document Revised: 08/25/2016 Document Reviewed: 10/07/2015 Elsevier Interactive Patient Education  2017 [place] Prevention in the Home Falls can cause injuries. They can happen to people of all ages. There are many things you can do to make your home safe and to help prevent falls. What can I do on the outside of my home?  Regularly fix the edges of walkways and driveways and fix any cracks.  Remove anything that might make you trip as you walk through a door, such as a raised step or threshold.  Trim any bushes or trees on the path to your home.  Use bright outdoor lighting.  Clear any walking paths of anything that might make someone trip, such as rocks or tools.  Regularly check to see if handrails are loose or broken. Make sure that both sides of any steps have handrails.  Any raised decks and porches should have guardrails on the edges.  Have any leaves, snow, or ice cleared regularly.  Use sand or salt on walking paths during winter.  Clean up any spills in your garage right away. This includes oil or grease spills. What can I do in the bathroom?  Use night  lights.  Install grab bars by the toilet and in the tub and shower. Do not use towel bars as grab bars.  Use non-skid mats or decals in the tub or shower.  If you need to sit down in the shower, use a plastic, non-slip stool.  Keep the floor dry. Clean up any water that spills on the floor as soon as it happens.  Remove soap buildup in the tub or shower regularly.  Attach bath mats securely with double-sided non-slip rug tape.  Do not have throw rugs and other things on the floor that can make you trip. What can I do in the bedroom?  Use night lights.  Make sure that you have a light by your bed that is easy to reach.  Do not use any sheets or blankets that are too big for your bed. They should not hang down onto the floor.  Have a firm chair that has side arms. You can use this for support while you get dressed.  Do not have throw rugs and other things on the floor that can make you trip. What can I do in the kitchen?  Clean up any spills right away.  Avoid walking on wet floors.  Keep items  that you use a lot in easy-to-reach places.  If you need to reach something above you, use a strong step stool that has a grab bar.  Keep electrical cords out of the way.  Do not use floor polish or wax that makes floors slippery. If you must use wax, use non-skid floor wax.  Do not have throw rugs and other things on the floor that can make you trip. What can I do with my stairs?  Do not leave any items on the stairs.  Make sure that there are handrails on both sides of the stairs and use them. Fix handrails that are broken or loose. Make sure that handrails are as long as the stairways.  Check any carpeting to make sure that it is firmly attached to the stairs. Fix any carpet that is loose or worn.  Avoid having throw rugs at the top or bottom of the stairs. If you do have throw rugs, attach them to the floor with carpet tape.  Make sure that you have a light switch at the  top of the stairs and the bottom of the stairs. If you do not have them, ask someone to add them for you. What else can I do to help prevent falls?  Wear shoes that:  Do not have high heels.  Have rubber bottoms.  Are comfortable and fit you well.  Are closed at the toe. Do not wear sandals.  If you use a stepladder:  Make sure that it is fully opened. Do not climb a closed stepladder.  Make sure that both sides of the stepladder are locked into place.  Ask someone to hold it for you, if possible.  Clearly mark and make sure that you can see:  Any grab bars or handrails.  First and last steps.  Where the edge of each step is.  Use tools that help you move around (mobility aids) if they are needed. These include:  Canes.  Walkers.  Scooters.  Crutches.  Turn on the lights when you go into a dark area. Replace any light bulbs as soon as they burn out.  Set up your furniture so you have a clear path. Avoid moving your furniture around.  If any of your floors are uneven, fix them.  If there are any pets around you, be aware of where they are.  Review your medicines with your doctor. Some medicines can make you feel dizzy. This can increase your chance of falling. Ask your doctor what other things that you can do to help prevent falls. This information is not intended to replace advice given to you by your health care provider. Make sure you discuss any questions you have with your health care provider. Document Released: 10/02/2009 Document Revised: 05/13/2016 Document Reviewed: 01/10/2015 Elsevier Interactive Patient Education  2017 [employer].

## 2017-09-19 NOTE — Progress Notes (Signed)
Subjective:   Alyssa Durham is a 66 y.o. female who presents for an Initial Medicare Annual Wellness Visit.  Review of Systems:  Cardiac Risk Factors include: advanced age (>80men, >2 women);dyslipidemia;obesity (BMI >30kg/m2);sedentary lifestyle     Objective:    Today's Vitals   09/19/17 1546  BP: 130/82  Pulse: 64  Temp: 98.1 F (36.7 C)  TempSrc: Oral  Weight: 170 lb 1.9 oz (77.2 kg)  Height: 5\' 2"  (1.575 m)   Body mass index is 31.12 kg/m.   Current Medications (verified) Outpatient Encounter Prescriptions as of 09/19/2017  Medication Sig  . albuterol (PROVENTIL HFA;VENTOLIN HFA) 108 (90 Base) MCG/ACT inhaler Inhale 2 puffs into the lungs every 6 (six) hours as needed for wheezing or shortness of breath.  Marland Kitchen BOTOX 200 units SOLR Inject 200 Units as directed every 3 (three) months.  . Clobetasol Prop Emollient Base 0.05 % emollient cream Apply topically 2 (two) times daily.  Marland Kitchen FLUoxetine (PROZAC) 10 MG tablet Take 1 tablet (10 mg total) by mouth daily.  . RELPAX 40 MG tablet TAKE 1 TABLET BY MOUTH AT ONSET OF MIGRAINE. DO NOT TAKE MORE THAN 3 A WEEK  . zonisamide (ZONEGRAN) 100 MG capsule Take 2 capsules by mouth at bedtime.   No facility-administered encounter medications on file as of 09/19/2017.     Allergies (verified) Codeine   History: Past Medical History:  Diagnosis Date  . Allergy   . Anxiety and depression   . Arthritis   . Asthma   . Cataract   . Chronic active type B viral hepatitis ([place])   . Depression   . HLD (hyperlipidemia) 05/12/2017  . Migraines   . Osteoporosis    Past Surgical History:  Procedure Laterality Date  . CESAREAN SECTION    . URETHRAL DILATION    . WISDOM TOOTH EXTRACTION     Family History  Problem Relation Age of Onset  . Uterine cancer Mother   . Migraines Father   . Early death Father 31       suicide   . Transient ischemic attack Father        multiple   . Migraines Brother   . Drug abuse Son    addict - stable on suboxone  . Hepatitis B Son   . Cancer Maternal Grandmother        breast  . Alcohol abuse Paternal Grandfather   . Early death Paternal Aunt        suicide  . Mental illness Paternal Aunt    Social History   Occupational History  . retired     Chiropractor   Social History Main Topics  . Smoking status: Former Smoker    Packs/day: 2.00    Years: 5.00    Types: Cigarettes    Start date: 12/21/1967    Quit date: 12/20/1972  . Smokeless tobacco: Never Used  . Alcohol use No  . Drug use: No  . Sexual activity: Not Currently    Tobacco Counseling Counseling given: Not Answered   Activities of Daily Living In your present state of health, do you have any difficulty performing the following activities: 09/19/2017 03/29/2017  Hearing? N N  Vision? Y N  Comment at night when driving, continue follow up with Dr. Gershon Crane -  Difficulty concentrating or making decisions? Y N  Walking or climbing stairs? N N  Dressing or bathing? N N  Doing errands, shopping? N N  Preparing Food and eating ? N -  Using the Toilet? N -  In the past six months, have you accidently leaked urine? N -  Do you have problems with loss of bowel control? N -  Managing your Medications? N -  Managing your Finances? N -  Housekeeping or managing your Housekeeping? N -  Some recent data might be hidden    Immunizations and Health Maintenance Immunization History  Administered Date(s) Administered  . Influenza,inj,Quad PF,6+ Mos 09/19/2017  . Pneumococcal Conjugate-13 03/26/2015  . Pneumococcal Polysaccharide-23 07/21/2011, 09/19/2017  . Zoster 03/23/2010   Health Maintenance Due  Topic Date Due  . TETANUS/TDAP  10/16/1968  . COLONOSCOPY  10/17/1999    Patient Care Team: Raylene Everts, MD as PCP - General (Family Medicine)  Indicate any recent Medical Services you may have received from other than [facility] providers in the past year (date may be approximate).       Assessment:   This is a routine wellness examination for Alyssa Durham.  Hearing/Vision screen No exam data present  Dietary issues and exercise activities discussed: Current Exercise Habits: The patient does not participate in regular exercise at present, Exercise limited by: None identified  Goals    . Exercise 3x per week (30 min per time)          Recommend starting a routine exercise program at least 3 days a week for 30-45 minutes at a time as tolerated.        Depression Screen PHQ 2/9 Scores 09/19/2017 03/29/2017  PHQ - 2 Score 0 0    Fall Risk Fall Risk  09/19/2017 07/05/2017 03/29/2017 03/14/2017 11/16/2016  Falls in the past year? Yes Yes No No Yes  Number falls in past yr: 1 1 - - 1  Injury with Fall? No Yes - - No  Follow up Falls evaluation completed;Education provided;Falls prevention discussed - - - -  Comment reveals patient tripped over her dog - - - -    Cognitive Function: Normal by direct observation  MMSE - Mini Mental State Exam 08/16/2016  Orientation to time 5  Orientation to Place 5  Registration 3  Attention/ Calculation 5  Recall 3  Language- name 2 objects 2  Language- repeat 1  Language- follow 3 step command 3  Language- read & follow direction 1  Write a sentence 1  Copy design 1  Total score 30        Screening Tests Health Maintenance  Topic Date Due  . TETANUS/TDAP  10/16/1968  . COLONOSCOPY  10/17/1999  . MAMMOGRAM  03/20/2018  . INFLUENZA VACCINE  Completed  . DEXA SCAN  Completed  . Hepatitis C Screening  Completed  . PNA vac Low Risk Adult  Completed      Plan:   I have personally reviewed and noted the following in the patient's chart:   . Medical and social history . Use of alcohol, tobacco or illicit drugs  . Current medications and supplements . Functional ability and status . Nutritional status . Physical activity . Advanced directives . List of other physicians . Hospitalizations, surgeries, and ER visits in  previous 12 months . Vitals . Screenings to include cognitive, depression, and falls . Referrals and appointments: [employer] referral sent today for dental care. Cologuard ordered today.  In addition, I have reviewed and discussed with patient certain preventive protocols, quality metrics, and best practice recommendations. A written personalized care plan for preventive services as well as general preventive health recommendations were provided to patient.  Stormy Fabian, LPN   [id number]

## 2017-09-20 ENCOUNTER — Telehealth: Payer: Self-pay | Admitting: Family Medicine

## 2017-09-20 NOTE — Telephone Encounter (Signed)
Patient says she received an pneumonia shot yesterday during her wellness visit with Albina Billet, she states her arm is in severe pain and she has been screaming all night in the worst pain of her life. She cant lift her arm more than an inch, lock her door, or put on her underwear.   Cb#: [phone]

## 2017-09-20 NOTE — Telephone Encounter (Signed)
Left message to call office

## 2017-09-21 ENCOUNTER — Encounter: Payer: Self-pay | Admitting: Family Medicine

## 2017-09-21 ENCOUNTER — Ambulatory Visit: Payer: Self-pay | Admitting: Family Medicine

## 2017-09-21 ENCOUNTER — Telehealth: Payer: Self-pay | Admitting: Family Medicine

## 2017-09-21 VITALS — BP 122/76 | HR 100 | Temp 96.2°F | Resp 16 | Ht 62.0 in | Wt 168.1 lb

## 2017-09-21 DIAGNOSIS — T8090XA Unspecified complication following infusion and therapeutic injection, initial encounter: Secondary | ICD-10-CM

## 2017-09-21 NOTE — Telephone Encounter (Signed)
Seen today. 

## 2017-09-21 NOTE — Progress Notes (Signed)
Chief Complaint  Patient presents with  . Arm Pain    right since monday   Here for shot complication She had a flu shot and prevnar shot as part of her wellness visit this week on September 19, 2017. She had no problem with the flu shot given in the left arm. The prevnar shot, given in the right upper arm started becoming painful a few hours after the injection and was progressively more severe through the night.  She states she was up all night in pain.  She considered calling 911, but was too embarrassed because she was physically unable to get out of  Her pajamas and into clothing.  She called the office yesterday, but was in bed an did not hear the nurse return her call.  Called today and was asked to come in.  Her cousin had to come to her home and help her get dressed, and drive her to office. She states the pain is in her right shoulder, down the arm to the elbow.  No numbness.  Cannot lift or move arm due to pain.  She has slightly more movement today than yesterday.  Nothing makes it better.  She has taken tylenol.  She has never had a shot reaction before.  She has never had shoulder problems before.  Patient Active Problem List   Diagnosis Date Noted  . HLD (hyperlipidemia) 05/12/2017  . History of non anemic vitamin B12 deficiency 05/12/2017  . RBBB 05/12/2017  . Calcification of aorta (HCC) 04/04/2017  . Anxiety and depression 03/29/2017  . Chronic hepatitis B ([place]) 03/29/2017  . Lichen sclerosus of female genitalia 03/29/2017  . Osteoporosis 03/29/2017  . Chronic migraine 08/16/2016    Outpatient Encounter Prescriptions as of 09/21/2017  Medication Sig  . albuterol (PROVENTIL HFA;VENTOLIN HFA) 108 (90 Base) MCG/ACT inhaler Inhale 2 puffs into the lungs every 6 (six) hours as needed for wheezing or shortness of breath.  Marland Kitchen BOTOX 200 units SOLR Inject 200 Units as directed every 3 (three) months.  . Clobetasol Prop Emollient Base 0.05 % emollient cream Apply topically 2  (two) times daily.  Marland Kitchen FLUoxetine (PROZAC) 10 MG tablet Take 1 tablet (10 mg total) by mouth daily.  . RELPAX 40 MG tablet TAKE 1 TABLET BY MOUTH AT ONSET OF MIGRAINE. DO NOT TAKE MORE THAN 3 A WEEK  . zonisamide (ZONEGRAN) 100 MG capsule Take 2 capsules by mouth at bedtime.   No facility-administered encounter medications on file as of 09/21/2017.     Allergies  Allergen Reactions  . Codeine Nausea And Vomiting    Review of Systems  Constitutional: Positive for fatigue. Negative for activity change, appetite change and unexpected weight change.       Patient states yesterday - didn't take temp  HENT: Negative for trouble swallowing and voice change.   Eyes: Negative for redness, itching and visual disturbance.  Respiratory: Negative for apnea, cough and shortness of breath.   Cardiovascular: Negative for chest pain, palpitations and leg swelling.  Gastrointestinal: Negative for constipation, diarrhea and nausea.  Musculoskeletal: Positive for arthralgias and myalgias.       All from right arm  Neurological: Positive for weakness.  Psychiatric/Behavioral: Positive for sleep disturbance.    BP 122/76 (BP Location: Left Arm, Patient Position: Sitting, Cuff Size: Normal)   Pulse 100   Temp (!) 96.2 F (35.7 C) (Temporal)   Resp 16   Ht 5\' 2"  (1.575 m)   Wt 168 lb 1.9 oz (  76.3 kg)   SpO2 97%   BMI 30.75 kg/m   Physical Exam  Constitutional: She appears well-developed and well-nourished. She appears distressed.  Acutely uncomfortable.  Tearful.  Holds R arm close to body  HENT:  Head: Normocephalic and atraumatic.  Mouth/Throat: Oropharynx is clear and moist.  Cardiovascular: Normal rate, regular rhythm and normal heart sounds.   Pulmonary/Chest: Breath sounds normal. She is in respiratory distress. She has no wheezes.  Musculoskeletal:       Right shoulder: She exhibits decreased range of motion, tenderness, pain and decreased strength. She exhibits no swelling, no deformity,  no spasm and normal pulse.       Back:  Tender around shouder joint.  No visible skin reaction.  Can flex bicep.  Cannot abduct or externally rotate hand and forearm beyond 80 degrees.  Skin: Skin is warm and dry. No rash noted. No erythema.  Psychiatric:  Upset, tearful    ASSESSMENT/PLAN:  1. Complication of injection, initial encounter I called Sanjuana Kava , MD in orthopedics.  I am uncertain what structure specifically is inflamed to cause this painful reaction.  It does not appear that the injection was given into the body of the deltoid muscle, but is closer to joint structures.  He recommends warm compresses, sling immobilization and time.  Pain management if needed.  He will see if not better by next week.    It is explained to patient that although she has had a painful reaction to this shot, I do not believe she will have any lasting harm from the shot.  We will offer close follow up until it is resolved to her satisfaction.  She is offered pain medicine, hydrocodone or oral toradol.  She declines.   Patient Instructions  Warm compresses Ibuprofen  Or  acetaminophen for pain  Gently start increasing movement  Call if not improving by Monday and we will get orthopedic follow up   See me on the 16th     Raylene Everts, MD

## 2017-09-21 NOTE — Telephone Encounter (Signed)
Called and spoke to Alyssa Durham. States she got the shot around 430pm, and started having pain around 8pm that night. States she is unable to move her arm or dress herself now. Asked her to come see you today...states she will if she can find someone to dress her and drive her here.

## 2017-09-21 NOTE — Telephone Encounter (Signed)
PATIENT RETURNED YOUR CALL

## 2017-09-21 NOTE — Patient Instructions (Signed)
Warm compresses Ibuprofen  Or  acetaminophen for pain  Gently start increasing movement  Call if not improving by Monday and we will get orthopedic follow up   See me on the 16th

## 2017-09-23 ENCOUNTER — Telehealth: Payer: Self-pay

## 2017-09-23 NOTE — Telephone Encounter (Signed)
Alyssa Durham called to let you know her arm is feeling better.

## 2017-09-27 ENCOUNTER — Telehealth: Payer: Self-pay | Admitting: Family Medicine

## 2017-09-27 NOTE — Telephone Encounter (Signed)
Patient states she is still in pain, its better but she cant do anything without taking pain reliever. States something isnt right and she is very frustrated. Cb#: [phone]

## 2017-09-28 NOTE — Telephone Encounter (Signed)
Left message to call office

## 2017-09-30 ENCOUNTER — Telehealth: Payer: Self-pay | Admitting: Neurology

## 2017-09-30 NOTE — Telephone Encounter (Signed)
Pt called and has a question about her medication Zonegran and would like a call back

## 2017-10-03 NOTE — Telephone Encounter (Signed)
GREAT 

## 2017-10-03 NOTE — Telephone Encounter (Signed)
Returned call.  No answer.  LMOM asking pt to return call to the office

## 2017-10-03 NOTE — Telephone Encounter (Signed)
Has appt with dr Meda Coffee tomorrow

## 2017-10-04 ENCOUNTER — Encounter: Payer: Self-pay | Admitting: Orthopaedic Surgery

## 2017-10-04 ENCOUNTER — Ambulatory Visit (INDEPENDENT_AMBULATORY_CARE_PROVIDER_SITE_OTHER): Payer: Self-pay | Admitting: Family Medicine

## 2017-10-04 ENCOUNTER — Encounter: Payer: Self-pay | Admitting: Family Medicine

## 2017-10-04 ENCOUNTER — Ambulatory Visit (INDEPENDENT_AMBULATORY_CARE_PROVIDER_SITE_OTHER): Payer: PPO | Admitting: Orthopaedic Surgery

## 2017-10-04 VITALS — BP 128/84 | HR 80 | Temp 97.0°F | Resp 18 | Ht 62.0 in | Wt 170.0 lb

## 2017-10-04 VITALS — BP 133/86 | HR 82 | Temp 97.6°F | Ht 62.0 in | Wt 169.0 lb

## 2017-10-04 DIAGNOSIS — T8090XA Unspecified complication following infusion and therapeutic injection, initial encounter: Secondary | ICD-10-CM | POA: Diagnosis not present

## 2017-10-04 DIAGNOSIS — M25511 Pain in right shoulder: Secondary | ICD-10-CM

## 2017-10-04 DIAGNOSIS — T50905S Adverse effect of unspecified drugs, medicaments and biological substances, sequela: Secondary | ICD-10-CM

## 2017-10-04 MED ORDER — NAPROXEN 500 MG PO TABS: 500.0000 mg | ORAL_TABLET | Freq: Two times a day (BID) | ORAL | 5 refills | Status: DC

## 2017-10-04 NOTE — Patient Instructions (Signed)
Continue ibuprofen for pain Warm compresses Need to see Dr Luna Glasgow for consult please

## 2017-10-04 NOTE — Progress Notes (Signed)
Chief Complaint  Patient presents with  . Follow-up    right arm   Here for follow up of right shoulder pain See documentation from last visit where she had extreme pain after prevnar injection that was potentially injected into shoulder structure other than muscle. Her arm is slowly improving, but not back to normal.  She still cannot lift R arm above 90 degrees, and has pain with rotation.  Never had shoulder problems previously.  It hurts especially in the morning.  Has needed ibuprofen daily for pain.  Still has not felt able to unload boxes in her car.  Was unable to cook her morning bacon for over a week due to weakness right arm. No fever or chills No skin changes or rash development    Patient Active Problem List   Diagnosis Date Noted  . HLD (hyperlipidemia) 05/12/2017  . History of non anemic vitamin B12 deficiency 05/12/2017  . RBBB 05/12/2017  . Calcification of aorta (HCC) 04/04/2017  . Anxiety and depression 03/29/2017  . Chronic hepatitis B ([place]) 03/29/2017  . Lichen sclerosus of female genitalia 03/29/2017  . Osteoporosis 03/29/2017  . Chronic migraine 08/16/2016    Outpatient Encounter Prescriptions as of 10/04/2017  Medication Sig  . albuterol (PROVENTIL HFA;VENTOLIN HFA) 108 (90 Base) MCG/ACT inhaler Inhale 2 puffs into the lungs every 6 (six) hours as needed for wheezing or shortness of breath.  Marland Kitchen BOTOX 200 units SOLR Inject 200 Units as directed every 3 (three) months.  . Clobetasol Prop Emollient Base 0.05 % emollient cream Apply topically 2 (two) times daily.  Marland Kitchen FLUoxetine (PROZAC) 10 MG tablet Take 1 tablet (10 mg total) by mouth daily.  . RELPAX 40 MG tablet TAKE 1 TABLET BY MOUTH AT ONSET OF MIGRAINE. DO NOT TAKE MORE THAN 3 A WEEK  . zonisamide (ZONEGRAN) 100 MG capsule Take 100 mg by mouth at bedtime.    No facility-administered encounter medications on file as of 10/04/2017.     Allergies  Allergen Reactions  . Codeine Nausea And Vomiting     Review of Systems  Constitutional: Negative for chills, fatigue and fever.  HENT: Negative for trouble swallowing and voice change.   Respiratory: Negative for apnea, cough and shortness of breath.   Cardiovascular: Negative for chest pain, palpitations and leg swelling.  Gastrointestinal: Negative for constipation, diarrhea and nausea.  Musculoskeletal: Positive for arthralgias and myalgias.       All from right arm  Neurological: Positive for weakness.  Psychiatric/Behavioral: Positive for sleep disturbance.    BP 128/84 (BP Location: Left Arm, Patient Position: Sitting, Cuff Size: Normal)   Pulse 80   Temp (!) 97 F (36.1 C) (Temporal)   Resp 18   Ht 5\' 2"  (1.575 m)   Wt 170 lb (77.1 kg)   SpO2 98%   BMI 31.09 kg/m   Physical Exam  Constitutional: She appears well-developed and well-nourished.  Well groomed.  No obvious distress  HENT:  Head: Normocephalic and atraumatic.  Neck: Normal range of motion. Neck supple.  No muscle tenderness  Cardiovascular: Normal rate, regular rhythm and normal heart sounds.   Pulmonary/Chest: Effort normal and breath sounds normal.  Musculoskeletal:  No tenderness to palpation.  Impaired abduction and rotation due to pain.    Skin: Skin is warm and dry. No erythema.  Psychiatric: She has a normal mood and affect. Her behavior is normal.    ASSESSMENT/PLAN:  1. Acute pain of right shoulder - Ambulatory referral to Orthopedic  Surgery  2. Reaction to shot, sequela - Ambulatory referral to Orthopedic Surgery   Patient Instructions  Continue ibuprofen for pain Warm compresses Need to see Dr Luna Glasgow for consult please   Raylene Everts, MD

## 2017-10-04 NOTE — Progress Notes (Signed)
Subjective:    Patient ID: Alyssa Durham, female    DOB: [birth year], 66 y.o.   MRN: [id number]  HPI She was given injection for flu vaccine in the right arm on 09-19-17 in the afternoon.  About four hours later she began experiencing marked pain in the right shoulder.  She could not use her arm  She said the pain was extreme.  She lives alone.  She "toughed" it out all night.  She could not change her clothes or use her arm the next day.  On 09-21-17 she had a friend come over and help her get dressed and help in the house.  She saw Dr. Meda Coffee for this on 09-21-17.  Dr. Meda Coffee had called me.  I told her to observe and I could see patient as needed.  The injection was given higher than normal and possibly into or close to the shoulder joint instead of the muscle area of the right upper arm.  The patient's pain continues.  She is not sleeping well.  She has improved her motion some and the severe acute pain is gone but she has limited motion.  She is upset that she had other activities to do and this prevents her from taking care of her 37 old and two year old grandchildren as well of other things.  She has had no redness, no weakness, no marked swelling.  She moved from [place] in the last few years to be close to her daughter and grandchildren and has no other family in the area.    Review of Systems  HENT: Negative for congestion.   Respiratory: Positive for shortness of breath. Negative for cough.   Cardiovascular: Negative for chest pain and leg swelling.  Endocrine: Negative for cold intolerance.  Musculoskeletal: Positive for arthralgias and neck pain.  Allergic/Immunologic: Positive for environmental allergies.  Neurological: Positive for headaches.  Psychiatric/Behavioral: The patient is nervous/anxious.    Past Medical History:  Diagnosis Date  . Allergy   . Anxiety and depression   . Arthritis   . Asthma   . Cataract   . Chronic active type B viral hepatitis ([place])    . Depression   . HLD (hyperlipidemia) 05/12/2017  . Migraines   . Osteoporosis     Past Surgical History:  Procedure Laterality Date  . CESAREAN SECTION    . URETHRAL DILATION    . WISDOM TOOTH EXTRACTION      Current Outpatient Prescriptions on File Prior to Visit  Medication Sig Dispense Refill  . albuterol (PROVENTIL HFA;VENTOLIN HFA) 108 (90 Base) MCG/ACT inhaler Inhale 2 puffs into the lungs every 6 (six) hours as needed for wheezing or shortness of breath. 18 g 0  . BOTOX 200 units SOLR Inject 200 Units as directed every 3 (three) months.    . Clobetasol Prop Emollient Base 0.05 % emollient cream Apply topically 2 (two) times daily.    Marland Kitchen FLUoxetine (PROZAC) 10 MG tablet Take 1 tablet (10 mg total) by mouth daily. 90 tablet 3  . RELPAX 40 MG tablet TAKE 1 TABLET BY MOUTH AT ONSET OF MIGRAINE. DO NOT TAKE MORE THAN 3 A WEEK 12 tablet 11  . zonisamide (ZONEGRAN) 100 MG capsule Take 100 mg by mouth at bedtime.   6   No current facility-administered medications on file prior to visit.     Social History   Social History  . Marital status: Divorced    Spouse name: N/A  . Number of children:  3  . Years of education: 108   Occupational History  . retired     Chiropractor   Social History Main Topics  . Smoking status: Former Smoker    Packs/day: 2.00    Years: 5.00    Types: Cigarettes    Start date: 12/21/1967    Quit date: 12/20/1972  . Smokeless tobacco: Never Used  . Alcohol use No  . Drug use: No  . Sexual activity: Not Currently   Other Topics Concern  . Not on file   Social History Narrative   Retired Web designer   Three children  [place], [place] and [employer] - 1, and one on the way    Family History  Problem Relation Age of Onset  . Uterine cancer Mother   . Cancer Mother        Breast cancer  . Migraines Father   . Early death Father 32       suicide   . Transient ischemic attack Father        multiple   .  Migraines Brother   . Drug abuse Son        addict - stable on suboxone  . Hepatitis B Son   . Cancer Maternal Grandmother        breast  . Alcohol abuse Paternal Grandfather   . Early death Paternal Aunt        suicide  . Mental illness Paternal Aunt     BP 133/86   Pulse 82   Temp 97.6 F (36.4 C)   Ht 5\' 2"  (1.575 m)   Wt 169 lb (76.7 kg)   BMI 30.91 kg/m      Objective:   Physical Exam  Constitutional: She is oriented to person, place, and time. She appears well-developed and well-nourished.  HENT:  Head: Normocephalic and atraumatic.  Eyes: Pupils are equal, round, and reactive to light. Conjunctivae and EOM are normal.  Neck: Normal range of motion. Neck supple.  Cardiovascular: Normal rate, regular rhythm and intact distal pulses.   Pulmonary/Chest: Effort normal.  Abdominal: Soft.  Musculoskeletal: She exhibits tenderness (Right shoulder tender, ROM forward 90, abduction 75, extension 5, internal 20, external 25, adduction 30.  NV intact.  No swelling or redness.  Neck slight tenderness left side.  Left shoulder OK.).  Neurological: She is alert and oriented to person, place, and time. She displays normal reflexes. No cranial nerve deficit. She exhibits normal muscle tone. Coordination normal.  Skin: Skin is warm and dry.  Psychiatric: She has a normal mood and affect. Her behavior is normal. Judgment and thought content normal.  Vitals reviewed.         Assessment & Plan:   Encounter Diagnoses  Name Primary?  . Pain in joint of right shoulder Yes  . Complication of injection, initial encounter    I have set up physical therapy for her.  I have told her it may take several weeks for this to resolve.  She will need to do some exercises at home.  Get Aspercreme or BioFreeze and use on right shoulder and neck.  I will call in Naprosyn for her.  Precautions discussed.  Return in two weeks.  Call if any problem.  Precautions  discussed.   Electronically Signed Sanjuana Kava, MD 10/16/20183:42 PM

## 2017-10-05 ENCOUNTER — Ambulatory Visit (HOSPITAL_COMMUNITY): Payer: PPO | Admitting: Occupational Therapy

## 2017-10-05 ENCOUNTER — Telehealth (HOSPITAL_COMMUNITY): Payer: Self-pay | Admitting: Family Medicine

## 2017-10-05 NOTE — Telephone Encounter (Signed)
10/05/17  pt cx said she was in to much pain and her neck has been really hurting.... she will call back to reschedule

## 2017-10-10 ENCOUNTER — Encounter: Payer: Self-pay | Admitting: Family Medicine

## 2017-10-10 ENCOUNTER — Telehealth: Payer: Self-pay | Admitting: Orthopaedic Surgery

## 2017-10-10 ENCOUNTER — Telehealth: Payer: Self-pay | Admitting: Family Medicine

## 2017-10-10 NOTE — Telephone Encounter (Signed)
Patient called to relay that her neck pain is continuing; aware of referral to therapy, and asking if there is any recommendation for her to be more comfortable in the meantime.  Ph# [phone]

## 2017-10-11 ENCOUNTER — Ambulatory Visit: Payer: PPO | Admitting: Orthopaedic Surgery

## 2017-10-11 ENCOUNTER — Encounter: Payer: Self-pay | Admitting: Orthopaedic Surgery

## 2017-10-12 ENCOUNTER — Encounter: Payer: Self-pay | Admitting: Orthopaedic Surgery

## 2017-10-12 ENCOUNTER — Ambulatory Visit (INDEPENDENT_AMBULATORY_CARE_PROVIDER_SITE_OTHER): Payer: PPO | Admitting: Orthopaedic Surgery

## 2017-10-12 VITALS — BP 143/84 | HR 96 | Temp 97.4°F | Ht 62.0 in | Wt 171.0 lb

## 2017-10-12 DIAGNOSIS — M542 Cervicalgia: Secondary | ICD-10-CM

## 2017-10-12 MED ORDER — TRAMADOL HCL 50 MG PO TABS: 50.0000 mg | ORAL_TABLET | Freq: Four times a day (QID) | ORAL | 1 refills | Status: DC | PRN

## 2017-10-12 NOTE — Progress Notes (Signed)
Patient SW:NIOE Artavia Jeanlouis, female DOB:29-Jun-1951, 66 y.o. VOJ:[id number]  Chief Complaint  Patient presents with  . Neck Pain     left side neck and right shoulder pain    HPI  Avrie Lashayla Armes is a 66 y.o. female who has developed pain in the neck area and the left upper shoulder area over the last several days since I saw her for the pain in the right shoulder.  She says the pain was severe, so severe she could not leave the house or lay down well.  She has had no numbness, no weakness, no paralysis.  It was centered over the left neck posteriorly and the left upper trapezius.  She took the Naprosyn (1/2 pill at a time).  Suddenly yesterday afternoon, she experienced itching in the area and her pain then went away.  She has no pain today in the area.  She slept well.  Her right shoulder is still painful.  She feels better today. HPI  Body mass index is 31.28 kg/m.  ROS  Review of Systems  HENT: Negative for congestion.   Respiratory: Positive for shortness of breath. Negative for cough.   Cardiovascular: Negative for chest pain and leg swelling.  Endocrine: Negative for cold intolerance.  Musculoskeletal: Positive for arthralgias and neck pain.  Allergic/Immunologic: Positive for environmental allergies.  Neurological: Positive for headaches.  Psychiatric/Behavioral: The patient is nervous/anxious.     Past Medical History:  Diagnosis Date  . Allergy   . Anxiety and depression   . Arthritis   . Asthma   . Cataract   . Chronic active type B viral hepatitis ([place])   . Depression   . HLD (hyperlipidemia) 05/12/2017  . Migraines   . Osteoporosis     Past Surgical History:  Procedure Laterality Date  . CESAREAN SECTION    . URETHRAL DILATION    . WISDOM TOOTH EXTRACTION      Family History  Problem Relation Age of Onset  . Uterine cancer Mother   . Cancer Mother        Breast cancer  . Migraines Father   . Early death Father 86       suicide   . Transient  ischemic attack Father        multiple   . Migraines Brother   . Drug abuse Son        addict - stable on suboxone  . Hepatitis B Son   . Cancer Maternal Grandmother        breast  . Alcohol abuse Paternal Grandfather   . Early death Paternal Aunt        suicide  . Mental illness Paternal Aunt     Social History Social History  Substance Use Topics  . Smoking status: Former Smoker    Packs/day: 2.00    Years: 5.00    Types: Cigarettes    Start date: 12/21/1967    Quit date: 12/20/1972  . Smokeless tobacco: Never Used  . Alcohol use No    Allergies  Allergen Reactions  . Codeine Nausea And Vomiting    Current Outpatient Prescriptions  Medication Sig Dispense Refill  . albuterol (PROVENTIL HFA;VENTOLIN HFA) 108 (90 Base) MCG/ACT inhaler Inhale 2 puffs into the lungs every 6 (six) hours as needed for wheezing or shortness of breath. 18 g 0  . BOTOX 200 units SOLR Inject 200 Units as directed every 3 (three) months.    . Clobetasol Prop Emollient Base 0.05 % emollient cream Apply  topically 2 (two) times daily.    Marland Kitchen FLUoxetine (PROZAC) 10 MG tablet Take 1 tablet (10 mg total) by mouth daily. 90 tablet 3  . naproxen (NAPROSYN) 500 MG tablet Take 1 tablet (500 mg total) by mouth 2 (two) times daily with a meal. 60 tablet 5  . RELPAX 40 MG tablet TAKE 1 TABLET BY MOUTH AT ONSET OF MIGRAINE. DO NOT TAKE MORE THAN 3 A WEEK 12 tablet 11  . traMADol (ULTRAM) 50 MG tablet Take 1 tablet (50 mg total) by mouth every 6 (six) hours as needed. 60 tablet 1  . zonisamide (ZONEGRAN) 100 MG capsule Take 100 mg by mouth at bedtime.   6   No current facility-administered medications for this visit.      Physical Exam  Blood pressure (!) 143/84, pulse 96, temperature (!) 97.4 F (36.3 C), height 5\' 2"  (1.575 m), weight 171 lb (77.6 kg).  Constitutional: overall normal hygiene, normal nutrition, well developed, normal grooming, normal body habitus. Assistive device:none  Musculoskeletal:  gait and station Limp none, muscle tone and strength are normal, no tremors or atrophy is present.  .  Neurological: coordination overall normal.  Deep tendon reflex/nerve stretch intact.  Sensation normal.  Cranial nerves II-XII intact.   Skin:   Normal overall no scars, lesions, ulcers or rashes. No psoriasis.  Psychiatric: Alert and oriented x 3.  Recent memory intact, remote memory unclear.  Normal mood and affect. Well groomed.  Good eye contact.  Cardiovascular: overall no swelling, no varicosities, no edema bilaterally, normal temperatures of the legs and arms, no clubbing, cyanosis and good capillary refill.  Lymphatic: palpation is normal.  All other systems reviewed and are negative   The neck area is tender but she has no tightness, no spasm.  She has full motion of the neck. The left upper trapezius is negative.  ROM of the left shoulder is full.  NV is intact.  The right shoulder is tender and motion is decreased but unchanged from prior exam.  NV intact. The patient has been educated about the nature of the problem(s) and counseled on treatment options.  The patient appeared to understand what I have discussed and is in agreement with it.  Encounter Diagnosis  Name Primary?  . Neck pain Yes   I have told her to stop the regular Naprosyn and I have given samples of Aleve to take twice a day after eating.  I have given Rx for Tramadol and take after food is on her stomach.  Take only a half a pill as she is so sensitive to medicine.  She had to cancel her PT and is rescheduled for November 2.  See me in three weeks.  PLAN Call if any problems.  Precautions discussed.    Return to clinic 3 weeks   Electronically Signed Sanjuana Kava, MD 10/24/201811:45 AM

## 2017-10-14 ENCOUNTER — Telehealth: Payer: Self-pay

## 2017-10-14 NOTE — Telephone Encounter (Signed)
Community resource referral received from [place] for patient to provide dental resources. Telephone outreach to patient who agreed to receive dental resources via mail.    Josepha Pigg, B.A.  Care Guide [phone]

## 2017-10-18 ENCOUNTER — Ambulatory Visit: Payer: PPO | Admitting: Orthopaedic Surgery

## 2017-10-21 ENCOUNTER — Encounter (HOSPITAL_COMMUNITY): Payer: Self-pay | Admitting: Occupational Therapy

## 2017-10-21 ENCOUNTER — Ambulatory Visit (HOSPITAL_COMMUNITY): Payer: PPO | Attending: Orthopaedic Surgery | Admitting: Occupational Therapy

## 2017-10-21 DIAGNOSIS — R29898 Other symptoms and signs involving the musculoskeletal system: Secondary | ICD-10-CM

## 2017-10-21 DIAGNOSIS — M25511 Pain in right shoulder: Secondary | ICD-10-CM

## 2017-10-21 DIAGNOSIS — M542 Cervicalgia: Secondary | ICD-10-CM | POA: Diagnosis present

## 2017-10-21 NOTE — Patient Instructions (Signed)
SHOULDER: Flexion On Table   Place hands on table, elbows straight. Move hips away from body. Press hands down into table._10-15__ reps per set, _3__ sets per day  Abduction (Passive)   With arm out to side, resting on table, lower head toward arm, keeping trunk away from table.  Repeat __10-15__ times. Do _3___ sessions per day.  Copyright  VHI. All rights reserved.     Internal Rotation (Assistive)   Seated with elbow bent at right angle and held against side, slide arm on table surface in an inward arc. Repeat __10-15__ times. Do _3___ sessions per day. Activity: Use this motion to brush crumbs off the table.  Copyright  VHI. All rights reserved.

## 2017-10-21 NOTE — Therapy (Signed)
[place], [place], [address] Phone: [phone]   Fax:  [phone]  Occupational Therapy Evaluation  Patient Details  Name: Alyssa Durham MRN: [id number] Date of Birth: [birth year] Referring Provider: Dr. Sanjuana Kava  Encounter Date: 10/21/2017      OT End of Session - 10/21/17 1746    Visit Number 1   Number of Visits 8   Date for OT Re-Evaluation 11/20/17   Authorization Type [employer]   Authorization Time Period Before 10th visit   Authorization - Visit Number 1   Authorization - Number of Visits 10   OT Start Time 1518   OT Stop Time 1557   OT Time Calculation (min) 39 min   Activity Tolerance Patient tolerated treatment well   Behavior During Therapy [facility] for tasks assessed/performed      Past Medical History:  Diagnosis Date  . Allergy   . Anxiety and depression   . Arthritis   . Asthma   . Cataract   . Chronic active type B viral hepatitis ([place])   . Depression   . HLD (hyperlipidemia) 05/12/2017  . Migraines   . Osteoporosis     Past Surgical History:  Procedure Laterality Date  . CESAREAN SECTION    . URETHRAL DILATION    . WISDOM TOOTH EXTRACTION      There were no vitals filed for this visit.      Subjective Assessment - 10/21/17 1714    Subjective  S: It started hurting the night after I got my pneumonia shot.    Pertinent History Pt is a 66 y/o female presenting with right shoulder pain after receiving a pneumonia shot on 09/19/17. Pt has been using pain medication for pain management. Pt was referred to occupational therapy for evaluation and treatment by Dr. Sanjuana Kava.    Patient Stated Goals To have less pain and be able to use my arm.    Currently in Pain? Yes   Pain Score 2    Pain Location Shoulder   Pain Orientation Right   Pain Descriptors / Indicators Aching;Sore   Pain Type Acute pain   Pain Radiating Towards elbow and neck   Pain Onset 1 to 4  weeks ago   Pain Frequency Constant   Aggravating Factors  movement, use   Pain Relieving Factors rest, pain medications   Effect of Pain on Daily Activities moderate effect on ADL completion   Multiple Pain Sites No           [facility] OT Assessment - 10/21/17 1522      Assessment   Diagnosis Right shoulder pain   Referring Provider Dr. Sanjuana Kava   Onset Date 09/19/17   Prior Therapy None     Precautions   Precautions None     Restrictions   Weight Bearing Restrictions No     Balance Screen   Has the patient fallen in the past 6 months No   Has the patient had a decrease in activity level because of a fear of falling?  No   Is the patient reluctant to leave their home because of a fear of falling?  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      Written Expression   Dominant Hand Right     Cognition   Overall Cognitive Status Within Functional Limits for tasks assessed     ROM / Strength   AROM / PROM / Strength AROM;PROM;Strength     Palpation   Palpation comment Max fascial restrictions along right upper arm, trapezius, and scapularis regions     AROM   Overall AROM Comments Assessed seated, er/IR adducte   AROM Assessment Site Shoulder   Right/Left Shoulder Right   Right Shoulder Flexion 135 Degrees   Right Shoulder ABduction 90 Degrees   Right Shoulder Internal Rotation 90 Degrees   Right Shoulder External Rotation 55 Degrees     PROM   Overall PROM Comments Assessed supine, er/IR adducted   PROM Assessment Site Shoulder   Right/Left Shoulder Right   Right Shoulder Flexion 154 Degrees   Right Shoulder ABduction 180 Degrees   Right Shoulder Internal Rotation 90 Degrees   Right  Shoulder External Rotation 64 Degrees     Strength   Overall Strength Comments Assessed seated, er/IR adducted   Strength Assessment Site Shoulder   Right/Left Shoulder Right   Right Shoulder Flexion 3+/5   Right Shoulder ABduction 3-/5   Right Shoulder Internal Rotation 4/5   Right Shoulder External Rotation 4-/5                         OT Education - 10/21/17 1549    Education provided Yes   Education Details table slides   Person(s) Educated Patient   Methods Explanation;Demonstration;Handout   Comprehension Verbalized understanding;Returned demonstration          OT Short Term Goals - 10/21/17 1751      OT SHORT TERM GOAL #1   Title Pt will be educated on and independent in HEP to improve mobility required for functional reaching with RUE.    Time 4   Period Weeks   Status New   Target Date 11/20/17     OT SHORT TERM GOAL #2   Title Pt will return to highest level of functioning using RUE as dominant during ADL completion.    Time 4   Period Weeks   Status New     OT SHORT TERM GOAL #3   Title Pt will decrease fascial restrictions in RUE from max to minimal amounts or less to improve mobility required for overhead reaching.    Time 4   Period Weeks   Status New     OT SHORT TERM GOAL #4   Title Pt will decrease RUE pain to 3/10 or less to improve ability to sleep comfortably.    Time 4   Period Weeks   Status New     OT SHORT TERM GOAL #5   Title Pt will improve A/ROM of RUE to WNL to improve ability to complete bathing tasks using RUE as dominant.    Time 4   Period Weeks   Status New     Additional Short Term Goals   Additional Short Term Goals Yes     OT SHORT TERM GOAL #6   Title Pt will improve RUE strength to 5/5 to increase ability to complete furniture refinishing tasks.    Time 4   Period Weeks   Status New                  Plan - 10/21/17 1747    Clinical  Impression Statement A: Pt is a 66 y/o female  presenting with right shoulder pain limiting ability to use RUE as dominant during functional task completion. Pt reports she was unable to use her arm very much at all for approximately 1 month, has been hesitant to use arm due to pain.    Occupational Profile and client history currently impacting functional performance Pt was independent in B/IADL tasks prior to pneumonia shot and is motivated to return to her PLOF.    Occupational performance deficits (Please refer to evaluation for details): ADL's;IADL's;Rest and Sleep;Leisure;Social Participation   Rehab Potential Good   OT Frequency 2x / week   OT Duration 4 weeks   OT Treatment/Interventions Self-care/ADL training;Therapeutic exercise;Patient/family education;Manual Therapy;Ultrasound;Therapeutic activities;Cryotherapy;Electrical Stimulation;Passive range of motion;Moist Heat   Plan P: Pt will benefit from skilled OT services to decrease pain and fascial restrictions and increase ROM, strength, and functional use of RUE as dominant. Treatment plan: myofascial release, manual therapy, P/ROM, AA/ROM, A/ROM, general RUE strengthening, scapular stability and strengthening, modalities prn   Clinical Decision Making Limited treatment options, no task modification necessary   OT Home Exercise Plan 2023/11/05: table slides   Consulted and Agree with Plan of Care Patient      Patient will benefit from skilled therapeutic intervention in order to improve the following deficits and impairments:  Impaired flexibility, Decreased strength, Decreased activity tolerance, Pain, Decreased range of motion, Increased fascial restricitons, Impaired UE functional use  Visit Diagnosis: Acute pain of right shoulder  Other symptoms and signs involving the musculoskeletal system      G-Codes - Nov 04, 2017 1754    Functional Assessment Tool Used (Outpatient only) clinical judgement   Functional Limitation Carrying, moving and handling objects   Carrying, Moving and  Handling Objects Current Status (M0867) At least 40 percent but less than 60 percent impaired, limited or restricted   Carrying, Moving and Handling Objects Goal Status (Y1950) At least 20 percent but less than 40 percent impaired, limited or restricted      Problem List Patient Active Problem List   Diagnosis Date Noted  . HLD (hyperlipidemia) 05/12/2017  . History of non anemic vitamin B12 deficiency 05/12/2017  . RBBB 05/12/2017  . Calcification of aorta (HCC) 04/04/2017  . Anxiety and depression 03/29/2017  . Chronic hepatitis B ([place]) 03/29/2017  . Lichen sclerosus of female genitalia 03/29/2017  . Osteoporosis 03/29/2017  . Chronic migraine 08/16/2016   Guadelupe Sabin, OTR/L  [phone] November 04, 2017, 5:54 PM  [place] [address] [place], [place], [address] Phone: [phone]   Fax:  [phone]  Name: Alyssa Durham MRN: [id number] Date of Birth: August 21, 1951

## 2017-10-26 ENCOUNTER — Encounter: Payer: Self-pay | Admitting: Neurology

## 2017-10-26 ENCOUNTER — Ambulatory Visit: Payer: PPO | Admitting: Neurology

## 2017-10-26 VITALS — BP 126/68 | HR 78 | Ht 62.0 in | Wt 171.0 lb

## 2017-10-26 DIAGNOSIS — G43709 Chronic migraine without aura, not intractable, without status migrainosus: Secondary | ICD-10-CM

## 2017-10-26 DIAGNOSIS — IMO0002 Reserved for concepts with insufficient information to code with codable children: Secondary | ICD-10-CM

## 2017-10-26 MED ORDER — RELPAX 40 MG PO TABS: ORAL_TABLET | ORAL | 11 refills | Status: DC

## 2017-10-26 NOTE — Patient Instructions (Signed)
1. Continue with Botox as scheduled 2. Take Relpax for migraine rescue. Do not use more than 2-3 times a week, otherwise migraines may worsen. 3. Follow-up in 3-4 months, call for any changes

## 2017-10-26 NOTE — Progress Notes (Signed)
NEUROLOGY FOLLOW UP OFFICE NOTE  Alyssa Durham [id number]  DOB: Oct 28, 1951  HISTORY OF PRESENT ILLNESS: I had the pleasure of seeing Alyssa Durham in follow-up in the neurology clinic on 10/26/2017.  The patient was last seen 4 months ago for chronic migraines and memory loss. She received Botox last September with Dr. Tomi Likens and is requesting future Botox sessions be back with Dr. Posey Pronto. She does not that migraines are a lot better with the Botox. She does not wake up as many mornings with migraines, and does not need Relpax as much. She started to notice hair loss and attributed it to Zonisamide, which she has been taking for the past year, so she weaned herself off and reports that her hair stopped coming out as she did the wean. She is dealing with a lot of neck pain and arm pain after she had a complication from vaccine injection on her right arm. She states it went into her bone. She is only now able to use her arm but still has pain, and does PT twice a week. Naproxen made her drowsy, she only takes prn Tramadol when she has to use her arm. She denies any diplopia, vision changes, dizziness, no recent falls.   HPI 08/16/2016: This is a 66 yo RH woman with a history of chronic migraines, depression. She moved from [place] 2 months ago, records from her previous neurologist were reviewed. She has had migraines since 1969. Migraines now are mostly localized over the right hemisphere, if she closes her eyes, she sees flashing lights, but no prior aura. This is associated with nausea, vomiting, photo and phonophobia. She has 4-5 migraine days a week, taking 1/2 to 2 tablets of Relpax at the onset, which does help ease progression of symptoms. For the past 4 years, she has had migraines upon awakening. Migraines were fairly well-controlled until 1993 when she was started on a trial of an unrecalled antidepressant which helped. In December 2016, headaches worsened and she was started on Lexapro, which  helped with headaches but caused weight gain and hair loss. She tried Celexa (weight gain), Zoloft (weight gain), Wellbutrin. She also tried Topamax but stopped due to risk for kidney stones. She recalls taking Inderal but cannot recall effects/side effects. Most recently, she was tried on nortriptyline, which did not help with the headaches, but helped her mood. She is now on Cymbalta for arthritis, which also helps with her mood. For rescue, she has tried sumatriptan, Maxalt, and Zomig, with no effect. Relpax and Frova helped the most, causing less palpitations. She has noticed stress to be a big trigger, as well a chocolates. She usually sleeps good. There is a family history of migraines in her father, brother, and niece. She had received 2 rounds of Botox injections for chronic migraine, the first one helped very well with much less use of Relpax. Last Botox was in April 2017 before her move.   She is also reporting memory loss. She had told her neurologist about this in 1994. Memory issues are more for long-term memory, she cannot recall what happened years ago with her family, which concerns her because she cannot recall her children growing up. Short-term memory is pretty good, she denies getting lost driving, no missed medications or bill payments. She denies any dizziness, diplopia, dysarthria, dysphagia, neck pain, bowel/bladder dysfunction. She has floaters in her left eye.   Diagnostic Data: MRI brain without contrast done 04/17/15 at [facility] in [place] did not show  any acute changes, there was subtle white matter hyperintensity in the posterior left occipital lobe. Compared to 2007 study, this is only minimally progressive. EEG done 04/18/15 in [place] was within normal limits.  Prior Preventative Medications: Zoloft, Topamax, Inderal, nortriptyline, Cymbalta, Botox, Zonisamide Prior Rescue medications: Relpax, sumatriptan, maxalt, Zomig, Frova, Amerge (did help) Has not  tried: gabapentin, depakote   PAST MEDICAL HISTORY: Past Medical History:  Diagnosis Date  . Allergy   . Anxiety and depression   . Arthritis   . Asthma   . Cataract   . Chronic active type B viral hepatitis ([place])   . Depression   . HLD (hyperlipidemia) 05/12/2017  . Migraines   . Osteoporosis     MEDICATIONS: Current Outpatient Medications on File Prior to Visit  Medication Sig Dispense Refill  . albuterol (PROVENTIL HFA;VENTOLIN HFA) 108 (90 Base) MCG/ACT inhaler Inhale 2 puffs into the lungs every 6 (six) hours as needed for wheezing or shortness of breath. 18 g 0  . BOTOX 200 units SOLR Inject 200 Units as directed every 3 (three) months.    . Clobetasol Prop Emollient Base 0.05 % emollient cream Apply topically 2 (two) times daily.    Marland Kitchen FLUoxetine (PROZAC) 10 MG tablet Take 1 tablet (10 mg total) by mouth daily. 90 tablet 3  . naproxen (NAPROSYN) 500 MG tablet Take 1 tablet (500 mg total) by mouth 2 (two) times daily with a meal. 60 tablet 5  . RELPAX 40 MG tablet TAKE 1 TABLET BY MOUTH AT ONSET OF MIGRAINE. DO NOT TAKE MORE THAN 3 A WEEK 12 tablet 11  . traMADol (ULTRAM) 50 MG tablet Take 1 tablet (50 mg total) by mouth every 6 (six) hours as needed. 60 tablet 1  . zonisamide (ZONEGRAN) 100 MG capsule Take 100 mg by mouth at bedtime. (Patient not taking)  6   No current facility-administered medications on file prior to visit.     ALLERGIES: Allergies  Allergen Reactions  . Codeine Nausea And Vomiting    FAMILY HISTORY: Family History  Problem Relation Age of Onset  . Uterine cancer Mother   . Cancer Mother        Breast cancer  . Migraines Father   . Early death Father 68       suicide   . Transient ischemic attack Father        multiple   . Migraines Brother   . Drug abuse Son        addict - stable on suboxone  . Hepatitis B Son   . Cancer Maternal Grandmother        breast  . Alcohol abuse Paternal Grandfather   . Early death Paternal Aunt         suicide  . Mental illness Paternal Aunt     SOCIAL HISTORY: Social History   Socioeconomic History  . Marital status: Divorced    Spouse name: Not on file  . Number of children: 3  . Years of education: 38  . Highest education level: Not on file  Social Needs  . Financial resource strain: Not on file  . Food insecurity - worry: Not on file  . Food insecurity - inability: Not on file  . Transportation needs - medical: Not on file  . Transportation needs - non-medical: Not on file  Occupational History  . Occupation: retired    Comment: Chiropractor  Tobacco Use  . Smoking status: Former Smoker    Packs/day: 2.00  Years: 5.00    Pack years: 10.00    Types: Cigarettes    Start date: 12/21/1967    Last attempt to quit: 12/20/1972    Years since quitting: 44.8  . Smokeless tobacco: Never Used  Substance and Sexual Activity  . Alcohol use: No  . Drug use: No  . Sexual activity: Not Currently  Other Topics Concern  . Not on file  Social History Narrative   Retired Web designer   Three children  [place], [place] and [employer] - 1, and one on the [address]    REVIEW OF SYSTEMS: Constitutional: No fevers, chills, or sweats, no generalized fatigue, change in appetite Eyes: No visual changes, double vision, eye pain Ear, nose and throat: No hearing loss, ear pain, nasal congestion, sore throat Cardiovascular: No chest pain, palpitations Respiratory:  No shortness of breath at rest or with exertion, wheezes GastrointestinaI: No nausea, vomiting, diarrhea, abdominal pain, fecal incontinence Genitourinary:  No dysuria, urinary retention or frequency Musculoskeletal:  No neck pain, back pain Integumentary: No rash, pruritus, skin lesions Neurological: as above Psychiatric: No depression, insomnia, anxiety Endocrine: No palpitations, fatigue, diaphoresis, mood swings, change in appetite, change in weight, increased thirst Hematologic/Lymphatic:  No anemia,  purpura, petechiae. Allergic/Immunologic: no itchy/runny eyes, nasal congestion, recent allergic reactions, rashes  PHYSICAL EXAM: Vitals:   10/26/17 1255  BP: 126/68  Pulse: 78  SpO2: 96%   General: No acute distress Head:  Normocephalic/atraumatic Neck: supple, no paraspinal tenderness, full range of motion Heart:  Regular rate and rhythm Lungs:  Clear to auscultation bilaterally Back: No paraspinal tenderness Skin/Extremities: No rash, no edema Neurological Exam: alert and oriented to person, place, and time. No aphasia or dysarthria. Fund of knowledge is appropriate.  Recent and remote memory are intact.  Attention and concentration are normal.    Able to name objects and repeat phrases. Cranial nerves: Pupils equal, round, reactive to light. Extraocular movements intact with no nystagmus. Visual fields full. Facial sensation intact. No facial asymmetry. Tongue, uvula, palate midline.  Motor: Bulk and tone normal, muscle strength 5/5 throughout with no pronator drift.  Sensation to light touch intact.  No extinction to double simultaneous stimulation.  Deep tendon reflexes brisk  2+ throughout, toes downgoing.  Finger to nose testing intact.  Gait narrow-based and steady, able to tandem walk adequately.  Romberg negative.  IMPRESSION: This is a 66 yo RH woman with chronic migraines. She presented with 4-5 migraines a week, she has had good response to Botox, with migraines around 2-3 a week. Continue Botox every 3 months. She self-weaned off Zonisamide. She does not want to start any other migraine preventative medication at this time, gabapentin or Depakote may be options in the future. Aimovig is also a potential option. She knows to minimize Relpax to 2-3 a week to avoid rebound headaches. She will keep a calendar of her migraines and follow-up in 3-4 months.   Thank you for allowing me to participate in her care.  Please do not hesitate to call for any questions or concerns.  The  duration of this appointment visit was 15 minutes of face-to-face time with the patient.  Greater than 50% of this time was spent in counseling, explanation of diagnosis, planning of further management, and coordination of care.   Ellouise Newer, M.D.  CC: Dr. Blanchie Serve

## 2017-10-27 ENCOUNTER — Encounter (HOSPITAL_COMMUNITY): Payer: Self-pay | Admitting: Occupational Therapy

## 2017-10-27 ENCOUNTER — Ambulatory Visit (HOSPITAL_COMMUNITY): Payer: PPO | Admitting: Occupational Therapy

## 2017-10-27 DIAGNOSIS — R29898 Other symptoms and signs involving the musculoskeletal system: Secondary | ICD-10-CM

## 2017-10-27 DIAGNOSIS — M25511 Pain in right shoulder: Secondary | ICD-10-CM

## 2017-10-27 NOTE — Patient Instructions (Signed)

## 2017-10-27 NOTE — Therapy (Signed)
[place], [place], [address] Phone: [phone]   Fax:  [phone]  Occupational Therapy Treatment  Patient Details  Name: Alyssa Durham MRN: [id number] Date of Birth: Sep 27, 1951 Referring Provider: Dr. Sanjuana Kava   Encounter Date: 10/27/2017  OT End of Session - 10/27/17 1726    Visit Number  2    Number of Visits  8    Date for OT Re-Evaluation  11/20/17    Authorization Type  [employer]    Authorization Time Period  Before 10th visit    Authorization - Visit Number  2    Authorization - Number of Visits  10    OT Start Time  1647    OT Stop Time  1726    OT Time Calculation (min)  39 min    Activity Tolerance  Patient tolerated treatment well    Behavior During Therapy  [facility] for tasks assessed/performed       Past Medical History:  Diagnosis Date  . Allergy   . Anxiety and depression   . Arthritis   . Asthma   . Cataract   . Chronic active type B viral hepatitis ([place])   . Depression   . HLD (hyperlipidemia) 05/12/2017  . Migraines   . Osteoporosis     Past Surgical History:  Procedure Laterality Date  . CESAREAN SECTION    . URETHRAL DILATION    . WISDOM TOOTH EXTRACTION      There were no vitals filed for this visit.  Subjective Assessment - 10/27/17 1647    Subjective   S: Those exercises hurt.     Currently in Pain?  Yes    Pain Score  2     Pain Location  Shoulder    Pain Orientation  Right    Pain Descriptors / Indicators  Aching;Sore    Pain Type  Acute pain    Pain Radiating Towards  neck    Pain Onset  More than a month ago    Pain Frequency  Constant    Aggravating Factors   movement, use    Pain Relieving Factors  rest, pain medication    Effect of Pain on Daily Activities  moderate effect on ADL completion    Multiple Pain Sites  No         [facility] OT Assessment - 10/27/17 1646      Assessment   Diagnosis  Right shoulder pain      Precautions   Precautions  None               OT Treatments/Exercises (OP) - 10/27/17 1651      Exercises   Exercises  Shoulder      Shoulder Exercises: Supine   Protraction  PROM;5 reps;AROM;10 reps    Horizontal ABduction  PROM;5 reps;AROM;10 reps    External Rotation  PROM;5 reps;AROM;10 reps    Internal Rotation  PROM;5 reps;AROM;10 reps    Flexion  PROM;5 reps;AROM;10 reps    ABduction  PROM;5 reps;AROM;10 reps      Shoulder Exercises: Seated   Elevation  AROM;10 reps    Extension  AROM;10 reps    Row  AROM;10 reps    Protraction  AROM;5 reps    Horizontal ABduction  AROM;5 reps    External Rotation  AROM;5 reps    Internal Rotation  AROM;5 reps    Flexion  AROM;5 reps    Abduction  AROM;5 reps  Shoulder Exercises: Therapy Ball   Right/Left  -- 3 reps each direction      Manual Therapy   Manual Therapy  Myofascial release    Manual therapy comments  Completed separately from therapeutic exercises     Myofascial Release  Myofascial release to right upper arm, trapezius, and scapularis regions to decrease pain and fascial restrictions and increase joint range of motion.              OT Education - 10/27/17 1712    Education provided  Yes    Education Details  A/ROM exercises    Person(s) Educated  Patient    Methods  Explanation;Demonstration;Handout    Comprehension  Verbalized understanding;Returned demonstration       OT Short Term Goals - 10/27/17 1708      OT SHORT TERM GOAL #1   Title  Pt will be educated on and independent in HEP to improve mobility required for functional reaching with RUE.     Time  4    Period  Weeks    Status  On-going      OT SHORT TERM GOAL #2   Title  Pt will return to highest level of functioning using RUE as dominant during ADL completion.     Time  4    Period  Weeks    Status  On-going      OT SHORT TERM GOAL #3   Title  Pt will decrease fascial restrictions in RUE from max to minimal amounts or less to improve  mobility required for overhead reaching.     Time  4    Period  Weeks    Status  On-going      OT SHORT TERM GOAL #4   Title  Pt will decrease RUE pain to 3/10 or less to improve ability to sleep comfortably.     Time  4    Period  Weeks    Status  On-going      OT SHORT TERM GOAL #5   Title  Pt will improve A/ROM of RUE to WNL to improve ability to complete bathing tasks using RUE as dominant.     Time  4    Period  Weeks    Status  On-going      OT SHORT TERM GOAL #6   Title  Pt will improve RUE strength to 5/5 to increase ability to complete furniture refinishing tasks.     Time  4    Period  Weeks    Status  On-going               Plan - 10/27/17 1712    Clinical Impression Statement  A: Initiated myofascial release, manual therapy, P/ROM, A/ROM, and scapular A/ROM this session. Pt able to complete with good form, ROM is WNL both passive and active today. Abduction is slightly painful for pt, occasional rest breaks provided as needed. Verbal cuing for form and technique.     Plan  P: Follow up on A/ROM HEP. Continue with A/ROM completing 10 repetitions in sitting. Add scapular theraband if pt able to tolerate     OT Home Exercise Plan  11/2: table slides; 11/8: A/ROM    Consulted and Agree with Plan of Care  Patient       Patient will benefit from skilled therapeutic intervention in order to improve the following deficits and impairments:  Impaired flexibility, Decreased strength, Decreased activity tolerance, Pain, Decreased range of motion, Increased fascial restricitons,  Impaired UE functional use  Visit Diagnosis: Acute pain of right shoulder  Other symptoms and signs involving the musculoskeletal system    Problem List Patient Active Problem List   Diagnosis Date Noted  . HLD (hyperlipidemia) 05/12/2017  . History of non anemic vitamin B12 deficiency 05/12/2017  . RBBB 05/12/2017  . Calcification of aorta (HCC) 04/04/2017  . Anxiety and depression  03/29/2017  . Chronic hepatitis B ([place]) 03/29/2017  . Lichen sclerosus of female genitalia 03/29/2017  . Osteoporosis 03/29/2017  . Chronic migraine 08/16/2016   Guadelupe Sabin, OTR/L  [phone] 10/27/2017, 5:28 PM  [place] [address] [place], [place], [address] Phone: [phone]   Fax:  [phone]  Name: Alyssa Durham MRN: [id number] Date of Birth: [birth year]

## 2017-10-28 ENCOUNTER — Encounter: Payer: Self-pay | Admitting: Orthopaedic Surgery

## 2017-10-31 ENCOUNTER — Ambulatory Visit (HOSPITAL_COMMUNITY): Payer: PPO

## 2017-11-02 ENCOUNTER — Ambulatory Visit (INDEPENDENT_AMBULATORY_CARE_PROVIDER_SITE_OTHER): Payer: Self-pay | Admitting: Orthopaedic Surgery

## 2017-11-02 ENCOUNTER — Encounter: Payer: Self-pay | Admitting: Orthopaedic Surgery

## 2017-11-02 VITALS — BP 147/80 | HR 78 | Temp 96.9°F | Ht 62.0 in | Wt 173.0 lb

## 2017-11-02 DIAGNOSIS — M542 Cervicalgia: Secondary | ICD-10-CM

## 2017-11-02 DIAGNOSIS — M25511 Pain in right shoulder: Secondary | ICD-10-CM

## 2017-11-02 NOTE — Telephone Encounter (Signed)
The patient is coming in for an apt 11/02/17.  We will address the additional physical therapy for her neck at that time.

## 2017-11-02 NOTE — Progress Notes (Signed)
Patient Alyssa Durham, female DOB:May 28, 1951, 66 y.o. MWN:[id number]  Chief Complaint  Patient presents with  . Shoulder Pain    Right and neck pain    HPI  Alyssa Durham is a 66 y.o. female who has right shoulder pain from injection of flu vaccine into or close to joint.  She has continued pain and now pain of the right upper trapezius and neck area.  She has been going to PT and is making good progress.  She has become emotional about this and the stress it has caused to her life in general.  Reaching to the back seat to get her purse causes pain.  She was voting recently and the height of the booth and marking the circles bothered her and caused pain.  She is concerned this may stay with her.  She is angry that it limits the use of the arm and shoulder on the right with everyday activities.  She crys at times.  I have told her to talk to Dr. Meda Coffee about this as well and consider possible biofeedback or medication.    I want her to continue PT and add treatments to the neck and upper trapezius area. I want them to try a TENS unit.  She cannot tolerate the Aleve as it upset her stomach. Her stomach is better after stopping the Aleve.  I have told her the cold weather and rain may make the pain worse temporarily.  She is improving some with the therapy and that is a good sign. HPI  Body mass index is 31.64 kg/m.  ROS  Review of Systems  HENT: Negative for congestion.   Respiratory: Positive for shortness of breath. Negative for cough.   Cardiovascular: Negative for chest pain and leg swelling.  Endocrine: Negative for cold intolerance.  Musculoskeletal: Positive for arthralgias and neck pain.  Allergic/Immunologic: Positive for environmental allergies.  Neurological: Positive for headaches.  Psychiatric/Behavioral: The patient is nervous/anxious.     Past Medical History:  Diagnosis Date  . Allergy   . Anxiety and depression   . Arthritis   . Asthma   . Cataract   .  Chronic active type B viral hepatitis ([place])   . Depression   . HLD (hyperlipidemia) 05/12/2017  . Migraines   . Osteoporosis     Past Surgical History:  Procedure Laterality Date  . CESAREAN SECTION    . URETHRAL DILATION    . WISDOM TOOTH EXTRACTION      Family History  Problem Relation Age of Onset  . Uterine cancer Mother   . Cancer Mother        Breast cancer  . Migraines Father   . Early death Father 36       suicide   . Transient ischemic attack Father        multiple   . Migraines Brother   . Drug abuse Son        addict - stable on suboxone  . Hepatitis B Son   . Cancer Maternal Grandmother        breast  . Alcohol abuse Paternal Grandfather   . Early death Paternal Aunt        suicide  . Mental illness Paternal Aunt     Social History Social History   Tobacco Use  . Smoking status: Former Smoker    Packs/day: 2.00    Years: 5.00    Pack years: 10.00    Types: Cigarettes    Start date: 12/21/1967  Last attempt to quit: 12/20/1972    Years since quitting: 44.8  . Smokeless tobacco: Never Used  Substance Use Topics  . Alcohol use: No  . Drug use: No    Allergies  Allergen Reactions  . Codeine Nausea And Vomiting    Current Outpatient Medications  Medication Sig Dispense Refill  . albuterol (PROVENTIL HFA;VENTOLIN HFA) 108 (90 Base) MCG/ACT inhaler Inhale 2 puffs into the lungs every 6 (six) hours as needed for wheezing or shortness of breath. 18 g 0  . BOTOX 200 units SOLR Inject 200 Units as directed every 3 (three) months.    . Clobetasol Prop Emollient Base 0.05 % emollient cream Apply topically 2 (two) times daily.    Marland Kitchen FLUoxetine (PROZAC) 10 MG tablet Take 1 tablet (10 mg total) by mouth daily. 90 tablet 3  . naproxen (NAPROSYN) 500 MG tablet Take 1 tablet (500 mg total) by mouth 2 (two) times daily with a meal. 60 tablet 5  . RELPAX 40 MG tablet TAKE 1 TABLET BY MOUTH AT ONSET OF MIGRAINE. DO NOT TAKE MORE THAN 3 A WEEK 12 tablet 11  .  traMADol (ULTRAM) 50 MG tablet Take 1 tablet (50 mg total) by mouth every 6 (six) hours as needed. 60 tablet 1   No current facility-administered medications for this visit.      Physical Exam  Blood pressure (!) 147/80, pulse 78, temperature (!) 96.9 F (36.1 C), height 5\' 2"  (1.575 m), weight 173 lb (78.5 kg).  Constitutional: overall normal hygiene, normal nutrition, well developed, normal grooming, normal body habitus. Assistive device:none  Musculoskeletal: gait and station Limp none, muscle tone and strength are normal, no tremors or atrophy is present.  .  Neurological: coordination overall normal.  Deep tendon reflex/nerve stretch intact.  Sensation normal.  Cranial nerves II-XII intact.   Skin:   Normal overall no scars, lesions, ulcers or rashes. No psoriasis.  Psychiatric: Alert and oriented x 3.  Recent memory intact, remote memory unclear.  Normal mood and affect. Well groomed.  Good eye contact.  Cardiovascular: overall no swelling, no varicosities, no edema bilaterally, normal temperatures of the legs and arms, no clubbing, cyanosis and good capillary refill.  Lymphatic: palpation is normal.  All other systems reviewed and are negative   Right side of the neck and upper trapezius is tender but has no spasm.  NV intact.  Moving the head to the right is tender, to the left is not.  She has no paresthesias.  Examination of right Upper Extremity is done.  Inspection:   Overall:  Elbow non-tender without crepitus or defects, forearm non-tender without crepitus or defects, wrist non-tender without crepitus or defects, hand non-tender.    Shoulder: with glenohumeral joint tenderness, without effusion.   Upper arm: without swelling and tenderness   Range of motion:   Overall:  Full range of motion of the elbow, full range of motion of wrist and full range of motion in fingers.   Shoulder:  right  160 degrees forward flexion; 150 degrees abduction; 30 degrees internal  rotation, 30 degrees external rotation, 15 degrees extension, 40 degrees adduction.   Stability:   Overall:  Shoulder, elbow and wrist stable   Strength and Tone:   Overall full shoulder muscles strength, full upper arm strength and normal upper arm bulk and tone.  The patient has been educated about the nature of the problem(s) and counseled on treatment options.  The patient appeared to understand what I have discussed and  is in agreement with it.  Encounter Diagnoses  Name Primary?  . Pain in joint of right shoulder Yes  . Neck pain on right side     PLAN Call if any problems.  Precautions discussed.  Continue current medications.   Return to clinic 3 weeks   Electronically Signed Sanjuana Kava, MD 11/14/20182:21 PM

## 2017-11-04 ENCOUNTER — Encounter: Payer: Self-pay | Admitting: Orthopaedic Surgery

## 2017-11-04 ENCOUNTER — Encounter (HOSPITAL_COMMUNITY): Payer: Self-pay | Admitting: Occupational Therapy

## 2017-11-04 ENCOUNTER — Ambulatory Visit (HOSPITAL_COMMUNITY): Payer: PPO | Admitting: Occupational Therapy

## 2017-11-04 ENCOUNTER — Other Ambulatory Visit: Payer: Self-pay

## 2017-11-04 DIAGNOSIS — R29898 Other symptoms and signs involving the musculoskeletal system: Secondary | ICD-10-CM

## 2017-11-04 DIAGNOSIS — M542 Cervicalgia: Secondary | ICD-10-CM

## 2017-11-04 DIAGNOSIS — M25511 Pain in right shoulder: Secondary | ICD-10-CM | POA: Diagnosis not present

## 2017-11-04 NOTE — Therapy (Signed)
[place], [place], [address] Phone: [phone]   Fax:  [phone]  Occupational Therapy Treatment  Patient Details  Name: Alyssa Durham MRN: [id number] Date of Birth: 25-Apr-1951 Referring Provider: Dr. Sanjuana Kava   Encounter Date: 11/04/2017  OT End of Session - 11/04/17 1637    Visit Number  3    Number of Visits  8    Date for OT Re-Evaluation  11/20/17    Authorization Type  [employer]    Authorization Time Period  Before 10th visit    Authorization - Visit Number  3    Authorization - Number of Visits  10    OT Start Time  1519    OT Stop Time  1604    OT Time Calculation (min)  45 min    Activity Tolerance  Patient tolerated treatment well    Behavior During Therapy  [facility] for tasks assessed/performed       Past Medical History:  Diagnosis Date  . Allergy   . Anxiety and depression   . Arthritis   . Asthma   . Cataract   . Chronic active type B viral hepatitis ([place])   . Depression   . HLD (hyperlipidemia) 05/12/2017  . Migraines   . Osteoporosis     Past Surgical History:  Procedure Laterality Date  . CESAREAN SECTION    . URETHRAL DILATION    . WISDOM TOOTH EXTRACTION      There were no vitals filed for this visit.  Subjective Assessment - 11/04/17 1521    Subjective   S: Dr. Luna Glasgow said he was going to send you something about my neck too.     Currently in Pain?  Yes    Pain Score  3     Pain Location  Shoulder    Pain Orientation  Right    Pain Descriptors / Indicators  Aching;Sore    Pain Type  Acute pain    Pain Radiating Towards  neck    Pain Onset  More than a month ago    Pain Frequency  Constant    Aggravating Factors   movement,use    Pain Relieving Factors  rest, pain medication    Effect of Pain on Daily Activities  mod effect on ADLs    Multiple Pain Sites  No         [facility] OT Assessment - 11/04/17 1520      Assessment   Diagnosis  Right  shoulder pain      Precautions   Precautions  None      AROM   AROM Assessment Site  Cervical    Cervical Flexion  48    Cervical Extension  54    Cervical - Right Side Bend  13    Cervical - Left Side Bend  11    Cervical - Right Rotation  55    Cervical - Left Rotation  40               OT Treatments/Exercises (OP) - 11/04/17 1521      Exercises   Exercises  Shoulder;Neck      Neck Exercises: Stretches   Upper Trapezius Stretch  2 reps;10 seconds    Levator Stretch  2 reps;10 seconds    Lower Cervical/Upper Thoracic Stretch  2 reps;10 seconds      Shoulder Exercises: Supine   Protraction  PROM;5 reps    Horizontal ABduction  PROM;5  reps    External Rotation  PROM;5 reps    Internal Rotation  PROM;5 reps    Flexion  PROM;5 reps    ABduction  PROM;5 reps      Shoulder Exercises: Seated   Protraction  AROM;10 reps    Horizontal ABduction  AROM;10 reps    External Rotation  AROM;10 reps    Internal Rotation  AROM;10 reps    Flexion  AROM;10 reps    Abduction  AROM;10 reps      Modalities   Modalities  Electrical Stimulation      Electrical Stimulation   Electrical Stimulation Location  cervical neck    Electrical Stimulation Action  interferential     Electrical Stimulation Parameters  7.8 CV    Electrical Stimulation Goals  Pain      Manual Therapy   Manual Therapy  Myofascial release    Manual therapy comments  Completed separately from therapeutic exercises     Myofascial Release  Myofascial release to right upper arm, trapezius, and scapularis regions to decrease pain and fascial restrictions and increase joint range of motion.                OT Short Term Goals - 11/04/17 1640      OT SHORT TERM GOAL #1   Title  Pt will be educated on and independent in HEP to improve mobility required for functional reaching with RUE.     Time  4    Period  Weeks    Status  On-going      OT SHORT TERM GOAL #2   Title  Pt will return to highest  level of functioning using RUE as dominant during ADL completion.     Time  4    Period  Weeks    Status  On-going      OT SHORT TERM GOAL #3   Title  Pt will decrease fascial restrictions in RUE from max to minimal amounts or less to improve mobility required for overhead reaching.     Time  4    Period  Weeks    Status  On-going      OT SHORT TERM GOAL #4   Title  Pt will decrease RUE and cervical neck pain to 3/10 or less to improve ability to sleep comfortably.     Time  4    Period  Weeks    Status  On-going      OT SHORT TERM GOAL #5   Title  Pt will improve A/ROM of RUE to WNL to improve ability to complete bathing tasks using RUE as dominant.     Time  4    Period  Weeks    Status  On-going      Additional Short Term Goals   Additional Short Term Goals  Yes      OT SHORT TERM GOAL #6   Title  Pt will improve RUE strength to 5/5 to increase ability to complete furniture refinishing tasks.     Time  4    Period  Weeks    Status  On-going      OT SHORT TERM GOAL #7   Title  Pt will improve cervical neck ROM to [facility] to improve ability to turn head when driving.     Time  4    Period  Weeks    Status  New               Plan -  11/04/17 1637    Clinical Impression Statement  A: Pt reports MD sent referral for neck and left shoulder, there is a new referral for neck pain, nothing for left shoulder. Cervical neck measurements taken today and goals added, certification updated and sent to MD. Pt reports more pain on left side than right today. Added cervical stretches with verbal cuing for form and technique. E-stim applied after session for pain management.     Plan  P: Sent referral request for left shoulder, if approved take LUE measurements. Add manual therapy for cervical neck and continue with shoulder and neck ROM and stretching.     OT Home Exercise Plan  11/2: table slides; 11/8: A/ROM       Patient will benefit from skilled therapeutic intervention in  order to improve the following deficits and impairments:  Impaired flexibility, Decreased strength, Decreased activity tolerance, Pain, Decreased range of motion, Increased fascial restricitons, Impaired UE functional use  Visit Diagnosis: Acute pain of right shoulder  Other symptoms and signs involving the musculoskeletal system  Cervicalgia    Problem List Patient Active Problem List   Diagnosis Date Noted  . HLD (hyperlipidemia) 05/12/2017  . History of non anemic vitamin B12 deficiency 05/12/2017  . RBBB 05/12/2017  . Calcification of aorta (HCC) 04/04/2017  . Anxiety and depression 03/29/2017  . Chronic hepatitis B ([place]) 03/29/2017  . Lichen sclerosus of female genitalia 03/29/2017  . Osteoporosis 03/29/2017  . Chronic migraine 08/16/2016   Guadelupe Sabin, OTR/L  [phone] 11/04/2017, 4:46 PM  [place] [address] [place], [place], [address] Phone: [phone]   Fax:  [phone]  Name: Alyssa Durham MRN: [id number] Date of Birth: Aug 01, 1951

## 2017-11-07 ENCOUNTER — Encounter: Payer: Self-pay | Admitting: Orthopaedic Surgery

## 2017-11-08 ENCOUNTER — Ambulatory Visit (HOSPITAL_COMMUNITY): Payer: PPO | Admitting: Occupational Therapy

## 2017-11-08 ENCOUNTER — Encounter (HOSPITAL_COMMUNITY): Payer: Self-pay | Admitting: Occupational Therapy

## 2017-11-08 ENCOUNTER — Other Ambulatory Visit: Payer: Self-pay

## 2017-11-08 DIAGNOSIS — M542 Cervicalgia: Secondary | ICD-10-CM

## 2017-11-08 DIAGNOSIS — M25511 Pain in right shoulder: Secondary | ICD-10-CM

## 2017-11-08 DIAGNOSIS — R29898 Other symptoms and signs involving the musculoskeletal system: Secondary | ICD-10-CM

## 2017-11-08 NOTE — Therapy (Signed)
[place], [place], [address] Phone: [phone]   Fax:  [phone]  Occupational Therapy Treatment  Patient Details  Name: Alyssa Durham MRN: [id number] Date of Birth: 05/14/1951 Referring Provider: Dr. Sanjuana Kava   Encounter Date: 11/08/2017  OT End of Session - 11/08/17 1659    Visit Number  4    Number of Visits  8    Date for OT Re-Evaluation  11/20/17    Authorization Type  [employer]    Authorization Time Period  Before 10th visit    Authorization - Visit Number  4    Authorization - Number of Visits  10    OT Start Time  8250    OT Stop Time  1705    OT Time Calculation (min)  61 min    Activity Tolerance  Patient tolerated treatment well    Behavior During Therapy  [facility] for tasks assessed/performed       Past Medical History:  Diagnosis Date  . Allergy   . Anxiety and depression   . Arthritis   . Asthma   . Cataract   . Chronic active type B viral hepatitis ([place])   . Depression   . HLD (hyperlipidemia) 05/12/2017  . Migraines   . Osteoporosis     Past Surgical History:  Procedure Laterality Date  . CESAREAN SECTION    . URETHRAL DILATION    . WISDOM TOOTH EXTRACTION      There were no vitals filed for this visit.  Subjective Assessment - 11/08/17 1604    Subjective   S: Last night my pain was horrible in my neck.     Currently in Pain?  Yes    Pain Score  5     Pain Location  Neck    Pain Orientation  Right;Left    Pain Type  Acute pain    Pain Radiating Towards  to trapezius    Pain Onset  More than a month ago    Pain Frequency  Constant    Aggravating Factors   movement, use    Pain Relieving Factors  rest, pain medication    Effect of Pain on Daily Activities  mod effect on ADLs    Multiple Pain Sites  No         [facility] OT Assessment - 11/08/17 1603      Assessment   Diagnosis  Right shoulder pain      Precautions   Precautions  None                OT Treatments/Exercises (OP) - 11/08/17 1607      Exercises   Exercises  Shoulder;Neck      Neck Exercises: Stretches   Upper Trapezius Stretch  2 reps;10 seconds both directions    Levator Stretch  2 reps;10 seconds both directions    Lower Cervical/Upper Thoracic Stretch  2 reps;10 seconds      Neck Exercises: Seated   X to V  10 reps    Shoulder Shrugs  10 reps    Shoulder Rolls  Backwards;10 reps      Neck Exercises: Supine   Cervical Rotation  Both;5 reps P/ROM    Lateral Flexion  Both;5 reps P/ROM      Shoulder Exercises: ROM/Strengthening   Prot/Ret//Elev/Dep  1'      Modalities   Modalities  Energy manager  cervical neck    Electrical Stimulation Action  interferential    Electrical Stimulation Parameters  6.2    Electrical Stimulation Goals  Pain      Manual Therapy   Manual Therapy  Myofascial release    Manual therapy comments  Completed separately from therapeutic exercises     Myofascial Release  Myofascial release to cervical neck and trapezius regions to decrease pain and fascial restrictions and increase joint range of motion.              OT Education - 11/08/17 1637    Education provided  Yes    Education Details  cervical neck stretches    Person(s) Educated  Patient    Methods  Explanation;Demonstration;Handout    Comprehension  Verbalized understanding;Returned demonstration       OT Short Term Goals - 11/04/17 1640      OT SHORT TERM GOAL #1   Title  Pt will be educated on and independent in HEP to improve mobility required for functional reaching with RUE.     Time  4    Period  Weeks    Status  On-going      OT SHORT TERM GOAL #2   Title  Pt will return to highest level of functioning using RUE as dominant during ADL completion.     Time  4    Period  Weeks    Status  On-going      OT SHORT TERM GOAL #3   Title  Pt will decrease  fascial restrictions in RUE from max to minimal amounts or less to improve mobility required for overhead reaching.     Time  4    Period  Weeks    Status  On-going      OT SHORT TERM GOAL #4   Title  Pt will decrease RUE and cervical neck pain to 3/10 or less to improve ability to sleep comfortably.     Time  4    Period  Weeks    Status  On-going      OT SHORT TERM GOAL #5   Title  Pt will improve A/ROM of RUE to WNL to improve ability to complete bathing tasks using RUE as dominant.     Time  4    Period  Weeks    Status  On-going      Additional Short Term Goals   Additional Short Term Goals  Yes      OT SHORT TERM GOAL #6   Title  Pt will improve RUE strength to 5/5 to increase ability to complete furniture refinishing tasks.     Time  4    Period  Weeks    Status  On-going      OT SHORT TERM GOAL #7   Title  Pt will improve cervical neck ROM to [facility] to improve ability to turn head when driving.     Time  4    Period  Weeks    Status  New               Plan - 11/08/17 1656    Clinical Impression Statement  A: Pt reports RUE is feeling good today, no pain, however cervical neck and left trapezius have been very painful over the past 2 days. Focused manual therapy at cervical neck and trapezius regions, continued with cervical neck exercises and stretches today. Pt requiring verbal cuing for form, notable tightness along left cervical and trapezius regions, verbal  cuing for form. Updated HEP for cervical stretches.     Plan  P: Continue to focus manual therapy on cervical neck and trapezius regions, add scapular theraband if pt able to tolerate. Follow up on cervical stretching HEP.     OT Home Exercise Plan  11/2: table slides; 11/8: A/ROM    Consulted and Agree with Plan of Care  Patient       Patient will benefit from skilled therapeutic intervention in order to improve the following deficits and impairments:  Impaired flexibility, Decreased strength, Decreased  activity tolerance, Pain, Decreased range of motion, Increased fascial restricitons, Impaired UE functional use  Visit Diagnosis: Acute pain of right shoulder  Other symptoms and signs involving the musculoskeletal system  Cervicalgia    Problem List Patient Active Problem List   Diagnosis Date Noted  . HLD (hyperlipidemia) 05/12/2017  . History of non anemic vitamin B12 deficiency 05/12/2017  . RBBB 05/12/2017  . Calcification of aorta (HCC) 04/04/2017  . Anxiety and depression 03/29/2017  . Chronic hepatitis B ([place]) 03/29/2017  . Lichen sclerosus of female genitalia 03/29/2017  . Osteoporosis 03/29/2017  . Chronic migraine 08/16/2016   Guadelupe Sabin, OTR/L  [phone] 11/08/2017, 5:25 PM  [facility] [address] [place], [place], [address] Phone: [phone]   Fax:  [phone]  Name: Alyssa Durham MRN: [id number] Date of Birth: 12/08/1951

## 2017-11-08 NOTE — Patient Instructions (Signed)
Flexibility: Neck Stretch   Grasp left arm above wrist and pull down across body while gently tilting head same direction. Hold for 3 seconds. Repeat 10 times.   Levator Scapula Stretch   Place left hand on same side shoulder blade. With other hand, gently stretch head down and away. Hold 3 seconds. Repeat 10 times.    Flexibility: Neck Retraction   Pull head straight back, keeping eyes and jaw level. *Give yourself a double chin.* Hold 3 seconds. Repeat 10 times.   Lower Cervical / Upper Thoracic Stretch   Clasp hands together in front with arms extended. Gently pull shoulder blades apart and bend head forward. Hold 3 seconds. Repeat 10 times.   Scalene Stretch, Sitting  Repeat __10_ times per session. Do _1-2__ sessions per day.  Copyright  VHI. All rights reserved.  Scalene Stretch, Sitting   Sit, one hand tucked under hip on side to be stretched, other hand over top of head. Gently pull head to side and backwards. Hold _10__ seconds. For more stretch, lean body in direction of head pull. Repeat _3__ times per session. Do _2__ sessions per day.

## 2017-11-09 ENCOUNTER — Other Ambulatory Visit: Payer: Self-pay | Admitting: Family Medicine

## 2017-11-14 ENCOUNTER — Encounter (HOSPITAL_COMMUNITY): Payer: Self-pay

## 2017-11-14 ENCOUNTER — Ambulatory Visit (HOSPITAL_COMMUNITY): Payer: PPO

## 2017-11-14 DIAGNOSIS — M542 Cervicalgia: Secondary | ICD-10-CM

## 2017-11-14 DIAGNOSIS — M25511 Pain in right shoulder: Secondary | ICD-10-CM

## 2017-11-14 DIAGNOSIS — R29898 Other symptoms and signs involving the musculoskeletal system: Secondary | ICD-10-CM

## 2017-11-14 NOTE — Therapy (Signed)
[place], [place], [address] Phone: [phone]   Fax:  [phone]  Occupational Therapy Treatment  Patient Details  Name: Alyssa Durham MRN: [id number] Date of Birth: 05-07-1951 Referring Provider: Dr. Sanjuana Kava   Encounter Date: 11/14/2017  OT End of Session - 11/14/17 1727    Visit Number  5    Number of Visits  8    Date for OT Re-Evaluation  11/20/17    Authorization Type  [employer]    Authorization Time Period  Before 10th visit    Authorization - Visit Number  5    Authorization - Number of Visits  10    OT Start Time  1310    OT Stop Time  1345    OT Time Calculation (min)  35 min    Activity Tolerance  Patient tolerated treatment well    Behavior During Therapy  [facility] for tasks assessed/performed       Past Medical History:  Diagnosis Date  . Allergy   . Anxiety and depression   . Arthritis   . Asthma   . Cataract   . Chronic active type B viral hepatitis ([place])   . Depression   . HLD (hyperlipidemia) 05/12/2017  . Migraines   . Osteoporosis     Past Surgical History:  Procedure Laterality Date  . CESAREAN SECTION    . URETHRAL DILATION    . WISDOM TOOTH EXTRACTION      There were no vitals filed for this visit.  Subjective Assessment - 11/14/17 1343    Subjective   S: my neck is what is bothering me the most.     Currently in Pain?  Yes    Pain Score  3     Pain Location  Neck    Pain Orientation  Left    Pain Descriptors / Indicators  Aching;Sore    Pain Type  Acute pain         [facility] OT Assessment - 11/14/17 1344      Assessment   Diagnosis  Right shoulder pain      Precautions   Precautions  None               OT Treatments/Exercises (OP) - 11/14/17 1725      Exercises   Exercises  Shoulder;Neck      Neck Exercises: Stretches   Upper Trapezius Stretch  10 seconds;1 rep both directions      Neck Exercises: Seated   W Back  10 reps     Shoulder Rolls  Backwards;10 reps               OT Short Term Goals - 11/04/17 1640      OT SHORT TERM GOAL #1   Title  Pt will be educated on and independent in HEP to improve mobility required for functional reaching with RUE.     Time  4    Period  Weeks    Status  On-going      OT SHORT TERM GOAL #2   Title  Pt will return to highest level of functioning using RUE as dominant during ADL completion.     Time  4    Period  Weeks    Status  On-going      OT SHORT TERM GOAL #3   Title  Pt will decrease fascial restrictions in RUE from max to minimal amounts or less to improve mobility required  for overhead reaching.     Time  4    Period  Weeks    Status  On-going      OT SHORT TERM GOAL #4   Title  Pt will decrease RUE and cervical neck pain to 3/10 or less to improve ability to sleep comfortably.     Time  4    Period  Weeks    Status  On-going      OT SHORT TERM GOAL #5   Title  Pt will improve A/ROM of RUE to WNL to improve ability to complete bathing tasks using RUE as dominant.     Time  4    Period  Weeks    Status  On-going      Additional Short Term Goals   Additional Short Term Goals  Yes      OT SHORT TERM GOAL #6   Title  Pt will improve RUE strength to 5/5 to increase ability to complete furniture refinishing tasks.     Time  4    Period  Weeks    Status  On-going      OT SHORT TERM GOAL #7   Title  Pt will improve cervical neck ROM to [facility] to improve ability to turn head when driving.     Time  4    Period  Weeks    Status  New               Plan - 11/14/17 1727    Clinical Impression Statement  A: Pt complained mainly of left side cervical region pain. Session focused on manual techniques for bilateral cervical and upper trapezius. Palpated moderate size muscle knot in left upper trapezius and on right lateral aspect of cervical region. During passive cervical ROM, patient was limited with left rotation with hard end feel.  Discussed use of heat with TENS unit at home. Recommended use of heat when patient returns home after session. Pt verbalized understanding.     Plan  P: Add scapular theraband if able to tolerate.        Patient will benefit from skilled therapeutic intervention in order to improve the following deficits and impairments:  Impaired flexibility, Decreased strength, Decreased activity tolerance, Pain, Decreased range of motion, Increased fascial restricitons, Impaired UE functional use  Visit Diagnosis: Cervicalgia  Other symptoms and signs involving the musculoskeletal system  Acute pain of right shoulder    Problem List Patient Active Problem List   Diagnosis Date Noted  . HLD (hyperlipidemia) 05/12/2017  . History of non anemic vitamin B12 deficiency 05/12/2017  . RBBB 05/12/2017  . Calcification of aorta (HCC) 04/04/2017  . Anxiety and depression 03/29/2017  . Chronic hepatitis B ([place]) 03/29/2017  . Lichen sclerosus of female genitalia 03/29/2017  . Osteoporosis 03/29/2017  . Chronic migraine 08/16/2016    Ailene Ravel, OTR/L,CBIS  [phone]  11/14/2017, 5:30 PM  [facility] [address] [place], [place], [address] Phone: [phone]   Fax:  [phone]  Name: Alyssa Durham MRN: [id number] Date of Birth: [birth year]

## 2017-11-14 NOTE — Telephone Encounter (Signed)
Seen 10 16 18 

## 2017-11-15 ENCOUNTER — Encounter: Payer: Self-pay | Admitting: Family Medicine

## 2017-11-15 ENCOUNTER — Telehealth (HOSPITAL_COMMUNITY): Payer: Self-pay | Admitting: Family Medicine

## 2017-11-15 ENCOUNTER — Other Ambulatory Visit: Payer: Self-pay

## 2017-11-15 ENCOUNTER — Ambulatory Visit: Payer: PPO | Admitting: Family Medicine

## 2017-11-15 VITALS — BP 122/74 | HR 92 | Temp 97.4°F | Resp 16 | Ht 62.0 in | Wt 173.0 lb

## 2017-11-15 DIAGNOSIS — F329 Major depressive disorder, single episode, unspecified: Secondary | ICD-10-CM

## 2017-11-15 DIAGNOSIS — B181 Chronic viral hepatitis B without delta-agent: Secondary | ICD-10-CM

## 2017-11-15 DIAGNOSIS — R928 Other abnormal and inconclusive findings on diagnostic imaging of breast: Secondary | ICD-10-CM | POA: Diagnosis not present

## 2017-11-15 DIAGNOSIS — M25511 Pain in right shoulder: Secondary | ICD-10-CM | POA: Diagnosis not present

## 2017-11-15 DIAGNOSIS — Z8619 Personal history of other infectious and parasitic diseases: Secondary | ICD-10-CM

## 2017-11-15 DIAGNOSIS — F419 Anxiety disorder, unspecified: Secondary | ICD-10-CM | POA: Diagnosis not present

## 2017-11-15 DIAGNOSIS — F32A Depression, unspecified: Secondary | ICD-10-CM

## 2017-11-15 HISTORY — DX: Personal history of other infectious and parasitic diseases: Z86.19

## 2017-11-15 NOTE — Progress Notes (Signed)
Chief Complaint  Patient presents with  . Follow-up   Patient is here for routine follow-up. She continues under the care of Dr. Luna Glasgow in physical therapy for shoulder pain, bilateral neck pain, trapezius strain. She does not tolerate the medications very well but she feels that the physical therapy is helpful.  When she last went to Dr. Luna Glasgow, she became emotional and he recommended she come back to see me.  She takes Prozac 10 mg a day.  I told her that with the stress that she has been under with her perceived disability from her injection, her migrating pain to her neck, the death of her cat, and the holidays believe she would be better suited on Prozac 20 mg a day.  She agrees to increase. She has an old history of HPV  infection and has not had a recent Pap smear.  She is recommended to get a Pap smear.  Will make an effort to call her prior provider and get her old Pap reports. She is overdue for a mammogram.  She states she has a history of abnormal mammogram.  The last mammogram I can find was normal.  Will order a standard mammogram for her. She sees gastroenterology for chronic hepatitis B infection.  When she lives in [place] she had blood work to look at her fibrosis score on an occasional basis.  Here, the doctor prefers for her to have a liver biopsy.  She objects to having a liver biopsy.  Told her she can negotiate this with her gastroenterologist, or change to another gastrologist if she is unhappy. Her headaches, recently, are improved  Patient Active Problem List   Diagnosis Date Noted  . History of HPV infection 11/15/2017  . HLD (hyperlipidemia) 05/12/2017  . History of non anemic vitamin B12 deficiency 05/12/2017  . RBBB 05/12/2017  . Calcification of aorta (HCC) 04/04/2017  . Anxiety and depression 03/29/2017  . Chronic hepatitis B ([place]) 03/29/2017  . Lichen sclerosus of female genitalia 03/29/2017  . Osteoporosis 03/29/2017  . Chronic migraine 08/16/2016     Outpatient Encounter Medications as of 11/15/2017  Medication Sig  . BOTOX 200 units SOLR Inject 200 Units as directed every 3 (three) months.  . Clobetasol Prop Emollient Base 0.05 % emollient cream Apply topically 2 (two) times daily.  Marland Kitchen FLUoxetine (PROZAC) 10 MG tablet Take 1 tablet (10 mg total) by mouth daily.  . naproxen (NAPROSYN) 500 MG tablet Take 1 tablet (500 mg total) by mouth 2 (two) times daily with a meal.  . PROAIR HFA 108 (90 Base) MCG/ACT inhaler INHALE 2 PUFFS INTO THE LUNGS EVERY 6 HOURS AS NEEDED FOR WHEEZING OR SHORTNESS OF BREATH  . RELPAX 40 MG tablet TAKE 1 TABLET BY MOUTH AT ONSET OF MIGRAINE. DO NOT TAKE MORE THAN 3 A WEEK  . traMADol (ULTRAM) 50 MG tablet Take 1 tablet (50 mg total) by mouth every 6 (six) hours as needed.   No facility-administered encounter medications on file as of 11/15/2017.     Allergies  Allergen Reactions  . Codeine Nausea And Vomiting    Review of Systems  Constitutional: Positive for activity change and fatigue. Negative for appetite change.       Still feels unable to do any lifting reasons  HENT: Negative for congestion and hearing loss.   Eyes: Negative for photophobia, redness and visual disturbance.  Respiratory: Negative for cough, choking and shortness of breath.   Cardiovascular: Negative for chest pain and palpitations.  Gastrointestinal: Negative for constipation and diarrhea.  Genitourinary: Negative for difficulty urinating and frequency.  Musculoskeletal: Positive for arthralgias, neck pain and neck stiffness.  Neurological: Positive for headaches.  Psychiatric/Behavioral: Positive for sleep disturbance. Negative for dysphoric mood. The patient is nervous/anxious.        Denies depression, easily tearful, appears to be overreacting to the injection complication right arm.  Patient is very sensitive.  She is very focused on her health.  The fact that her arm and neck are continuing to bother her is very upsetting  to her.  Going to increase her Prozac and try to get her more active.  I gently tried to get her to focus on moving forward instead of ruminating about how "violated" she feels.  BP 122/74 (BP Location: Left Arm, Patient Position: Sitting, Cuff Size: Normal)   Pulse 92   Temp (!) 97.4 F (36.3 C) (Temporal)   Resp 16   Ht 5\' 2"  (1.575 m)   Wt 173 lb (78.5 kg)   SpO2 96%   BMI 31.64 kg/m   Physical Exam  Constitutional: She is oriented to person, place, and time. She appears well-developed and well-nourished.  Well groomed.  No obvious distress  HENT:  Head: Normocephalic and atraumatic.  Mouth/Throat: Oropharynx is clear and moist.  Eyes: Conjunctivae are normal. Pupils are equal, round, and reactive to light.  Neck: Normal range of motion. Neck supple.  No muscle tenderness  Cardiovascular: Normal rate, regular rhythm and normal heart sounds.  Pulmonary/Chest: Effort normal and breath sounds normal.  Musculoskeletal:  No tenderness to palpation.  Neck or shoulder muscles.  Appears to have fluid movement.  Neurological: She is alert and oriented to person, place, and time.  Skin: Skin is warm and dry. No erythema.  Psychiatric:  Continues to talk about how her life has been affected by her shot, about her pain is, and is easily tearful    ASSESSMENT/PLAN:  1. Acute pain of right shoulder Persistent complaining of pain, does not demonstrate pain behavior.  Recommend continued physical therapy  2. Anxiety and depression Exacerbated by recent stresses.  Increase Prozac  3. Chronic hepatitis B ([place]) Discussed with patient, she needs to remain under the care of her gastroenterologist  4. Abnormal mammogram By history, mammogram is ordered.  5. History of HPV infection By history, old records requested.  Patient needs to make an appointment to get a new Pap   Patient Instructions  Need mammogram in November ( when you are able) No change in medicine today You may  consider increasing the prozac to 20 mg a day Continue under the care Dr Luna Glasgow for your neck and shoulder  See me every 6 months Need PE next visit  See if we can get most recent PAP reports   Raylene Everts, MD

## 2017-11-15 NOTE — Telephone Encounter (Signed)
11/15/17  pt needed to change appts for this week... mattress being delivered and she needed to be at home when it came

## 2017-11-15 NOTE — Telephone Encounter (Signed)
11/15/17  Pt called and needs to change her 11/28 appt.  I offered Thursday at 9:45/1:45 or Friday at 4:45- I left her a message

## 2017-11-15 NOTE — Patient Instructions (Signed)
Need mammogram in November ( when you are able) No change in medicine today You may consider increasing the prozac to 20 mg a day Continue under the care Dr Luna Glasgow for your neck and shoulder  See me every 6 months Need PE next visit  See if we can get most recent PAP reports

## 2017-11-16 ENCOUNTER — Ambulatory Visit (HOSPITAL_COMMUNITY): Payer: PPO | Admitting: Occupational Therapy

## 2017-11-17 ENCOUNTER — Ambulatory Visit (HOSPITAL_COMMUNITY): Payer: PPO

## 2017-11-17 ENCOUNTER — Encounter (HOSPITAL_COMMUNITY): Payer: Self-pay

## 2017-11-17 DIAGNOSIS — R29898 Other symptoms and signs involving the musculoskeletal system: Secondary | ICD-10-CM

## 2017-11-17 DIAGNOSIS — M25511 Pain in right shoulder: Secondary | ICD-10-CM

## 2017-11-17 DIAGNOSIS — M542 Cervicalgia: Secondary | ICD-10-CM

## 2017-11-17 NOTE — Therapy (Signed)
[place], [place], [address] Phone: [phone]   Fax:  [phone]  Occupational Therapy Treatment  Patient Details  Name: Alyssa Durham MRN: [id number] Date of Birth: 1951-09-08 Referring Provider: Dr. Sanjuana Kava   Encounter Date: 11/17/2017  OT End of Session - 11/17/17 1458    Visit Number  6    Number of Visits  8    Date for OT Re-Evaluation  11/20/17    Authorization Type  [employer]    Authorization Time Period  Before 10th visit    Authorization - Visit Number  6    Authorization - Number of Visits  10    OT Start Time  1353 pt arrived late    OT Stop Time  1430    OT Time Calculation (min)  37 min    Activity Tolerance  Patient tolerated treatment well    Behavior During Therapy  [facility] for tasks assessed/performed       Past Medical History:  Diagnosis Date  . Allergy   . Anxiety and depression   . Arthritis   . Asthma   . Cataract   . Chronic active type B viral hepatitis ([place])   . Depression   . HLD (hyperlipidemia) 05/12/2017  . Migraines   . Osteoporosis     Past Surgical History:  Procedure Laterality Date  . CESAREAN SECTION    . URETHRAL DILATION    . WISDOM TOOTH EXTRACTION      There were no vitals filed for this visit.  Subjective Assessment - 11/17/17 1412    Subjective   S: My neck and shoulder only hurts if I'm trying to reach or move a certain way.    Currently in Pain?  Yes Only when moving. 4/10         [facility] OT Assessment - 11/17/17 1420      Assessment   Diagnosis  Right shoulder pain      Precautions   Precautions  None               OT Treatments/Exercises (OP) - 11/17/17 1421      Exercises   Exercises  Shoulder;Neck      Neck Exercises: Stretches   Other Neck Stretches  Cervical rotation stretch both sides with towel as assistance; 2 sets; 10 seconds.       Neck Exercises: Seated   Other Seated Exercise  Cevical  lateral flexion and rotation 10X      Shoulder Exercises: Supine   Protraction  PROM;5 reps    Horizontal ABduction  PROM;5 reps    External Rotation  PROM;5 reps    Internal Rotation  PROM;5 reps    Flexion  PROM;5 reps    ABduction  PROM;5 reps      Shoulder Exercises: Standing   Extension  Theraband;10 reps    Theraband Level (Shoulder Extension)  Level 2 (Red)    Row  Theraband;10 reps    Theraband Level (Shoulder Row)  Level 2 (Red)      Shoulder Exercises: ROM/Strengthening   UBE (Upper Arm Bike)  Level 1 2' reverse      Manual Therapy   Manual Therapy  Myofascial release    Manual therapy comments  Completed separately from therapeutic exercises     Myofascial Release  Myofascial release to cervical neck and trapezius regions to decrease pain and fascial restrictions and increase joint range of motion.  OT Short Term Goals - 11/04/17 1640      OT SHORT TERM GOAL #1   Title  Pt will be educated on and independent in HEP to improve mobility required for functional reaching with RUE.     Time  4    Period  Weeks    Status  On-going      OT SHORT TERM GOAL #2   Title  Pt will return to highest level of functioning using RUE as dominant during ADL completion.     Time  4    Period  Weeks    Status  On-going      OT SHORT TERM GOAL #3   Title  Pt will decrease fascial restrictions in RUE from max to minimal amounts or less to improve mobility required for overhead reaching.     Time  4    Period  Weeks    Status  On-going      OT SHORT TERM GOAL #4   Title  Pt will decrease RUE and cervical neck pain to 3/10 or less to improve ability to sleep comfortably.     Time  4    Period  Weeks    Status  On-going      OT SHORT TERM GOAL #5   Title  Pt will improve A/ROM of RUE to WNL to improve ability to complete bathing tasks using RUE as dominant.     Time  4    Period  Weeks    Status  On-going      Additional Short Term Goals   Additional  Short Term Goals  Yes      OT SHORT TERM GOAL #6   Title  Pt will improve RUE strength to 5/5 to increase ability to complete furniture refinishing tasks.     Time  4    Period  Weeks    Status  On-going      OT SHORT TERM GOAL #7   Title  Pt will improve cervical neck ROM to [facility] to improve ability to turn head when driving.     Time  4    Period  Weeks    Status  New               Plan - 11/17/17 1459    Clinical Impression Statement  A: Added cervical stretches and scapular strengthening to increase ROM and scapular stability. VC for form and technique. Pt complains of pain in supraspinatus when moving and reaching for items at home and when shopping.     Plan  P: Reassessment       Patient will benefit from skilled therapeutic intervention in order to improve the following deficits and impairments:  Impaired flexibility, Decreased strength, Decreased activity tolerance, Pain, Decreased range of motion, Increased fascial restricitons, Impaired UE functional use  Visit Diagnosis: Cervicalgia  Other symptoms and signs involving the musculoskeletal system  Acute pain of right shoulder    Problem List Patient Active Problem List   Diagnosis Date Noted  . History of HPV infection 11/15/2017  . HLD (hyperlipidemia) 05/12/2017  . History of non anemic vitamin B12 deficiency 05/12/2017  . RBBB 05/12/2017  . Calcification of aorta (HCC) 04/04/2017  . Anxiety and depression 03/29/2017  . Chronic hepatitis B ([place]) 03/29/2017  . Lichen sclerosus of female genitalia 03/29/2017  . Osteoporosis 03/29/2017  . Chronic migraine 08/16/2016   Ailene Ravel, OTR/L,CBIS  [phone]  11/17/2017, 3:02 PM  [place]  [facility] [address] [place], [place], [address] Phone: [phone]   Fax:  [phone]  Name: Alyssa Durham MRN: [id number] Date of Birth: Jan 05, 1951

## 2017-11-21 ENCOUNTER — Encounter (HOSPITAL_COMMUNITY): Payer: Self-pay

## 2017-11-21 ENCOUNTER — Ambulatory Visit (HOSPITAL_COMMUNITY): Payer: PPO | Attending: Orthopaedic Surgery

## 2017-11-21 DIAGNOSIS — R29898 Other symptoms and signs involving the musculoskeletal system: Secondary | ICD-10-CM | POA: Insufficient documentation

## 2017-11-21 DIAGNOSIS — M542 Cervicalgia: Secondary | ICD-10-CM | POA: Diagnosis present

## 2017-11-21 DIAGNOSIS — M25511 Pain in right shoulder: Secondary | ICD-10-CM | POA: Insufficient documentation

## 2017-11-21 NOTE — Therapy (Signed)
[place], [place], [address] Phone: [phone]   Fax:  [phone]  Occupational Therapy Treatment And mini reassess Patient Details  Name: Alyssa Durham MRN: [id number] Date of Birth: 08-19-1951 Referring Provider: Dr. Sanjuana Kava   Encounter Date: 11/21/2017  OT End of Session - 11/21/17 1551    Visit Number  7    Number of Visits  16    Date for OT Re-Evaluation  12/21/17    Authorization Type  [employer]    Authorization Time Period  Before 17th visit    Authorization - Visit Number  7    Authorization - Number of Visits  17    OT Start Time  1435 reassessment    OT Stop Time  1515    OT Time Calculation (min)  40 min    Activity Tolerance  Patient tolerated treatment well    Behavior During Therapy  [facility] for tasks assessed/performed       Past Medical History:  Diagnosis Date  . Allergy   . Anxiety and depression   . Arthritis   . Asthma   . Cataract   . Chronic active type B viral hepatitis ([place])   . Depression   . HLD (hyperlipidemia) 05/12/2017  . Migraines   . Osteoporosis     Past Surgical History:  Procedure Laterality Date  . CESAREAN SECTION    . URETHRAL DILATION    . WISDOM TOOTH EXTRACTION      There were no vitals filed for this visit.  Subjective Assessment - 11/21/17 1549    Subjective   S: I can definitely tell there is improvement.     Currently in Pain?  Yes    Pain Score  3     Pain Location  Neck    Pain Orientation  Left    Pain Descriptors / Indicators  Aching;Sore    Pain Type  Acute pain    Pain Radiating Towards  to trapezius    Pain Onset  More than a month ago    Pain Frequency  Constant    Aggravating Factors   movement, use    Pain Relieving Factors  rest, pain medication    Effect of Pain on Daily Activities  mod effect on ADLs    Multiple Pain Sites  No         [facility] OT Assessment - 11/21/17 1437      Assessment   Diagnosis   Right shoulder pain    Onset Date  09/19/17      Precautions   Precautions  None      Prior Function   Level of Independence  Independent      AROM   Overall AROM Comments  Assessed seated, er/IR adducte    AROM Assessment Site  Shoulder;Cervical    Right/Left Shoulder  Right    Right Shoulder Flexion  151 Degrees previous: 135    Right Shoulder ABduction  102 Degrees previous: 90    Right Shoulder Internal Rotation  90 Degrees previuos: same    Right Shoulder External Rotation  90 Degrees previous: 55    Cervical Flexion  60 previuos; 48    Cervical Extension  65 previous: 54    Cervical - Right Side Bend  50 previous: 13    Cervical - Left Side Bend  20 previous: 11    Cervical - Right Rotation  66 previous: 55    Cervical -  Left Rotation  45 previous: 40      PROM   Overall PROM Comments  Assessed supine, er/IR adducted    PROM Assessment Site  Shoulder    Right/Left Shoulder  Right    Right Shoulder Flexion  180 Degrees previous: 154    Right Shoulder ABduction  180 Degrees previous: same    Right Shoulder Internal Rotation  90 Degrees previous: same    Right Shoulder External Rotation  65 Degrees previous: 64      Strength   Overall Strength Comments  Assessed seated, er/IR adducted    Strength Assessment Site  Shoulder    Right/Left Shoulder  Right    Right Shoulder Flexion  3+/5 previous: same    Right Shoulder ABduction  3+/5 previous: 3-/5    Right Shoulder Internal Rotation  5/5 previous: 4/5    Right Shoulder External Rotation  5/5 previous: 4-/5                         OT Short Term Goals - 11/21/17 1506      OT SHORT TERM GOAL #1   Title  Pt will be educated on and independent in HEP to improve mobility required for functional reaching with RUE.     Time  4    Period  Weeks    Status  Achieved      OT SHORT TERM GOAL #2   Title  Pt will return to highest level of functioning using RUE as dominant during ADL completion.     Time  4     Period  Weeks    Status  On-going      OT SHORT TERM GOAL #3   Title  Pt will decrease fascial restrictions in RUE from max to minimal amounts or less to improve mobility required for overhead reaching.     Time  4    Period  Weeks    Status  Achieved      OT SHORT TERM GOAL #4   Title  Pt will decrease RUE and cervical neck pain to 3/10 or less to improve ability to sleep comfortably.     Time  4    Period  Weeks    Status  On-going      OT SHORT TERM GOAL #5   Title  Pt will improve A/ROM of RUE to WNL to improve ability to complete bathing tasks using RUE as dominant.     Time  4    Period  Weeks    Status  Partially Met      OT SHORT TERM GOAL #6   Title  Pt will improve RUE strength to 5/5 to increase ability to complete furniture refinishing tasks.     Time  4    Period  Weeks    Status  Partially Met      OT SHORT TERM GOAL #7   Title  Pt will improve cervical neck ROM to [facility] to improve ability to turn head when driving.     Time  4    Period  Weeks    Status  Partially Met               Plan - 11/21/17 1552    Clinical Impression Statement  A: Reassessment completed this date as patient has a follow up appointment with MD this Wednesday. pt has met 2/7 therapy goals with some partially met as well. Patient continues to  have limitations with ROM of neck and shoulder; mainly abduction with shoulder. She also has pain with movement. Strength for shoulder flexion and abduction are still an issue. patient reports that she notices improvement since starting therapy. She states that when she reaching out away from her body for an items she has increased difficulty and pain.     Plan  P: continue OT services focus on cervical rotation, cervical and right shoulder pain, and strength of RUE. Next session continue with plan of care. Work on increasing abduction by adding wall stretch.        Patient will benefit from skilled therapeutic intervention in order to  improve the following deficits and impairments:  Impaired flexibility, Decreased strength, Decreased activity tolerance, Pain, Decreased range of motion, Increased fascial restrictions, Impaired UE functional use  Visit Diagnosis: Cervicalgia - Plan: Ot plan of care cert/re-cert  Other symptoms and signs involving the musculoskeletal system - Plan: Ot plan of care cert/re-cert  Acute pain of right shoulder - Plan: Ot plan of care cert/re-cert  G-Codes - 68/34/19 1606    Functional Assessment Tool Used (Outpatient only)  clinical judgement    Functional Limitation  Carrying, moving and handling objects    Carrying, Moving and Handling Objects Current Status (Q2229)  At least 40 percent but less than 60 percent impaired, limited or restricted    Carrying, Moving and Handling Objects Goal Status (N9892)  At least 20 percent but less than 40 percent impaired, limited or restricted       Problem List Patient Active Problem List   Diagnosis Date Noted  . History of HPV infection 11/15/2017  . HLD (hyperlipidemia) 05/12/2017  . History of non anemic vitamin B12 deficiency 05/12/2017  . RBBB 05/12/2017  . Calcification of aorta (HCC) 04/04/2017  . Anxiety and depression 03/29/2017  . Chronic hepatitis B ([place]) 03/29/2017  . Lichen sclerosus of female genitalia 03/29/2017  . Osteoporosis 03/29/2017  . Chronic migraine 08/16/2016   Ailene Ravel, OTR/L,CBIS  [phone]  11/21/2017, 4:09 PM  [facility] [address] [place], [place], [address] Phone: [phone]   Fax:  [phone]  Name: Kassondra Laquita Harlan MRN: [id number] Date of Birth: 1951-06-02

## 2017-11-23 ENCOUNTER — Ambulatory Visit (HOSPITAL_COMMUNITY): Payer: PPO | Admitting: Occupational Therapy

## 2017-11-23 ENCOUNTER — Encounter: Payer: Self-pay | Admitting: Orthopaedic Surgery

## 2017-11-23 ENCOUNTER — Encounter: Payer: Self-pay | Admitting: Family Medicine

## 2017-11-23 ENCOUNTER — Other Ambulatory Visit: Payer: Self-pay

## 2017-11-23 ENCOUNTER — Encounter (HOSPITAL_COMMUNITY): Payer: Self-pay | Admitting: Occupational Therapy

## 2017-11-23 ENCOUNTER — Ambulatory Visit (INDEPENDENT_AMBULATORY_CARE_PROVIDER_SITE_OTHER): Payer: PPO | Admitting: Orthopaedic Surgery

## 2017-11-23 VITALS — BP 131/76 | HR 68 | Temp 93.3°F | Ht 62.0 in | Wt 173.0 lb

## 2017-11-23 DIAGNOSIS — R29898 Other symptoms and signs involving the musculoskeletal system: Secondary | ICD-10-CM

## 2017-11-23 DIAGNOSIS — M542 Cervicalgia: Secondary | ICD-10-CM | POA: Diagnosis not present

## 2017-11-23 DIAGNOSIS — M25511 Pain in right shoulder: Secondary | ICD-10-CM | POA: Diagnosis not present

## 2017-11-23 NOTE — Therapy (Signed)
[place], [place], [address] Phone: [phone]   Fax:  [phone]  Occupational Therapy Treatment  Patient Details  Name: Alyssa Durham MRN: [id number] Date of Birth: 02/16/1951 Referring Provider: Dr. Sanjuana Kava   Encounter Date: 11/23/2017  OT End of Session - 11/23/17 1520    Visit Number  8    Number of Visits  16    Date for OT Re-Evaluation  12/21/17    Authorization Type  [employer]    Authorization Time Period  Before 17th visit    Authorization - Visit Number  8    Authorization - Number of Visits  17    OT Start Time  1436    OT Stop Time  1516    OT Time Calculation (min)  40 min    Activity Tolerance  Patient tolerated treatment well    Behavior During Therapy  [facility] for tasks assessed/performed       Past Medical History:  Diagnosis Date  . Allergy   . Anxiety and depression   . Arthritis   . Asthma   . Cataract   . Chronic active type B viral hepatitis ([place])   . Depression   . HLD (hyperlipidemia) 05/12/2017  . Migraines   . Osteoporosis     Past Surgical History:  Procedure Laterality Date  . CESAREAN SECTION    . URETHRAL DILATION    . WISDOM TOOTH EXTRACTION      There were no vitals filed for this visit.  Subjective Assessment - 11/23/17 1438    Subjective   S: I put a heavy pizza in the back seat yesterday and that made it sore.     Currently in Pain?  Yes    Pain Score  4     Pain Location  Shoulder    Pain Orientation  Right    Pain Descriptors / Indicators  Aching;Sore    Pain Type  Acute pain    Pain Radiating Towards  to neck    Pain Onset  More than a month ago    Pain Frequency  Constant    Aggravating Factors   certain movements, use    Pain Relieving Factors  rest, pain medication    Effect of Pain on Daily Activities  mod effec on ADLs    Multiple Pain Sites  No         [facility] OT Assessment - 11/23/17 1436      Assessment   Diagnosis   Right shoulder pain      Precautions   Precautions  None               OT Treatments/Exercises (OP) - 11/23/17 1440      Exercises   Exercises  Shoulder;Neck      Neck Exercises: Stretches   Other Neck Stretches  Cervical rotation stretch both sides with towel as assistance; 2 sets; 10 seconds.       Shoulder Exercises: Supine   Protraction  PROM;5 reps    Horizontal ABduction  PROM;5 reps    External Rotation  PROM;5 reps    Internal Rotation  PROM;5 reps    Flexion  PROM;5 reps    ABduction  PROM;5 reps      Shoulder Exercises: Standing   Protraction  AROM;12 reps    Horizontal ABduction  AROM;12 reps    External Rotation  AROM;12 reps;Theraband;10 reps    Theraband Level (Shoulder External Rotation)  Level 2 (Red)    Internal Rotation  AROM;12 reps;Theraband;10 reps    Theraband Level (Shoulder Internal Rotation)  Level 2 (Red)    Flexion  AROM;12 reps    ABduction  AROM;12 reps    Extension  Theraband;10 reps    Theraband Level (Shoulder Extension)  Level 2 (Red)    Row  Theraband;10 reps    Theraband Level (Shoulder Row)  Level 2 (Red)    Retraction  Theraband;10 reps    Theraband Level (Shoulder Retraction)  Level 2 (Red)      Shoulder Exercises: ROM/Strengthening   UBE (Upper Arm Bike)  Level 1 3' reverse    X to V Arms  10X      Manual Therapy   Manual Therapy  Myofascial release    Manual therapy comments  Completed separately from therapeutic exercises     Myofascial Release  Myofascial release to cervical neck and trapezius regions to decrease pain and fascial restrictions and increase joint range of motion.              OT Education - 11/23/17 1503    Education provided  Yes    Education Details  A/ROM exercises    Person(s) Educated  Patient    Methods  Explanation;Demonstration;Handout    Comprehension  Verbalized understanding;Returned demonstration       OT Short Term Goals - 11/21/17 1506      OT SHORT TERM GOAL #1   Title   Pt will be educated on and independent in HEP to improve mobility required for functional reaching with RUE.     Time  4    Period  Weeks    Status  Achieved      OT SHORT TERM GOAL #2   Title  Pt will return to highest level of functioning using RUE as dominant during ADL completion.     Time  4    Period  Weeks    Status  On-going      OT SHORT TERM GOAL #3   Title  Pt will decrease fascial restrictions in RUE from max to minimal amounts or less to improve mobility required for overhead reaching.     Time  4    Period  Weeks    Status  Achieved      OT SHORT TERM GOAL #4   Title  Pt will decrease RUE and cervical neck pain to 3/10 or less to improve ability to sleep comfortably.     Time  4    Period  Weeks    Status  On-going      OT SHORT TERM GOAL #5   Title  Pt will improve A/ROM of RUE to WNL to improve ability to complete bathing tasks using RUE as dominant.     Time  4    Period  Weeks    Status  Partially Met      OT SHORT TERM GOAL #6   Title  Pt will improve RUE strength to 5/5 to increase ability to complete furniture refinishing tasks.     Time  4    Period  Weeks    Status  Partially Met      OT SHORT TERM GOAL #7   Title  Pt will improve cervical neck ROM to [facility] to improve ability to turn head when driving.     Time  4    Period  Weeks    Status  Partially Met  Plan - 11/23/17 1520    Clinical Impression Statement  A: Pt achieving ROM WNL during passive stretching as well as A/ROM today, reports soreness from lifting a heavy box and placing in back seat of car yesterday. No neck pain today. Added x to v arms, retraction/er/IR with red theraband, verbal cuing for form and technique. Pt reports she was able to decorate for Christmas without difficulty.     Plan  P: Attempt 1# weight with A/ROM, follow up on HEP. Add w arms     OT Home Exercise Plan  11/2: table slides; 11/8: A/ROM    Consulted and Agree with Plan of Care  Patient        Patient will benefit from skilled therapeutic intervention in order to improve the following deficits and impairments:  Impaired flexibility, Decreased strength, Decreased activity tolerance, Pain, Decreased range of motion, Increased fascial restrictions, Impaired UE functional use  Visit Diagnosis: Cervicalgia  Other symptoms and signs involving the musculoskeletal system  Acute pain of right shoulder    Problem List Patient Active Problem List   Diagnosis Date Noted  . History of HPV infection 11/15/2017  . HLD (hyperlipidemia) 05/12/2017  . History of non anemic vitamin B12 deficiency 05/12/2017  . RBBB 05/12/2017  . Calcification of aorta (HCC) 04/04/2017  . Anxiety and depression 03/29/2017  . Chronic hepatitis B ([place]) 03/29/2017  . Lichen sclerosus of female genitalia 03/29/2017  . Osteoporosis 03/29/2017  . Chronic migraine 08/16/2016   Guadelupe Sabin, OTR/L  [phone] 11/23/2017, 3:23 PM  [facility] [address] [place], [place], [address] Phone: [phone]   Fax:  [phone]  Name: Kendalynn Elma Shands MRN: [id number] Date of Birth: January 25, 1951

## 2017-11-23 NOTE — Patient Instructions (Signed)

## 2017-11-23 NOTE — Progress Notes (Signed)
Patient Alyssa Durham, female DOB:12/28/1950, 66 y.o. WJX:[id number]  Chief Complaint  Patient presents with  . Follow-up    Rt shoulder and neck pain    HPI  Alyssa Durham is a 66 y.o. female who has right shoulder pain after injection of flu vaccine earlier this fall.  She has been going to PT/OT.  She also has neck pain.  She is improving.  She has less pain.  She has some episodes of pain but it is really more tenderness.  She has several days with no pain.  She says PT helps and she is doing her exercises. HPI  Body mass index is 31.64 kg/m.  ROS  Review of Systems  HENT: Negative for congestion.   Respiratory: Positive for shortness of breath. Negative for cough.   Cardiovascular: Negative for chest pain and leg swelling.  Endocrine: Negative for cold intolerance.  Musculoskeletal: Positive for arthralgias and neck pain.  Allergic/Immunologic: Positive for environmental allergies.  Neurological: Positive for headaches.  Psychiatric/Behavioral: The patient is nervous/anxious.   All other systems reviewed and are negative.   Past Medical History:  Diagnosis Date  . Allergy   . Anxiety and depression   . Arthritis   . Asthma   . Cataract   . Chronic active type B viral hepatitis ([place])   . Depression   . HLD (hyperlipidemia) 05/12/2017  . Migraines   . Osteoporosis     Past Surgical History:  Procedure Laterality Date  . CESAREAN SECTION    . URETHRAL DILATION    . WISDOM TOOTH EXTRACTION      Family History  Problem Relation Age of Onset  . Uterine cancer Mother   . Cancer Mother        Breast cancer  . Migraines Father   . Early death Father 107       suicide   . Transient ischemic attack Father        multiple   . Migraines Brother   . Drug abuse Son        addict - stable on suboxone  . Hepatitis B Son   . Cancer Maternal Grandmother        breast  . Alcohol abuse Paternal Grandfather   . Early death Paternal Aunt        suicide  .  Mental illness Paternal Aunt     Social History Social History   Tobacco Use  . Smoking status: Former Smoker    Packs/day: 2.00    Years: 5.00    Pack years: 10.00    Types: Cigarettes    Start date: 12/21/1967    Last attempt to quit: 12/20/1972    Years since quitting: 44.9  . Smokeless tobacco: Never Used  Substance Use Topics  . Alcohol use: No  . Drug use: No    Allergies  Allergen Reactions  . Codeine Nausea And Vomiting    Current Outpatient Medications  Medication Sig Dispense Refill  . BOTOX 200 units SOLR Inject 200 Units as directed every 3 (three) months.    . Clobetasol Prop Emollient Base 0.05 % emollient cream Apply topically 2 (two) times daily.    Marland Kitchen FLUoxetine (PROZAC) 10 MG tablet Take 1 tablet (10 mg total) by mouth daily. 90 tablet 3  . naproxen (NAPROSYN) 500 MG tablet Take 1 tablet (500 mg total) by mouth 2 (two) times daily with a meal. 60 tablet 5  . PROAIR HFA 108 (90 Base) MCG/ACT inhaler INHALE 2  PUFFS INTO THE LUNGS EVERY 6 HOURS AS NEEDED FOR WHEEZING OR SHORTNESS OF BREATH 18 g 1  . RELPAX 40 MG tablet TAKE 1 TABLET BY MOUTH AT ONSET OF MIGRAINE. DO NOT TAKE MORE THAN 3 A WEEK 12 tablet 11  . traMADol (ULTRAM) 50 MG tablet Take 1 tablet (50 mg total) by mouth every 6 (six) hours as needed. 60 tablet 1   No current facility-administered medications for this visit.      Physical Exam  Blood pressure 131/76, pulse 68, temperature (!) 93.3 F (34.1 C), height 5\' 2"  (1.575 m), weight 173 lb (78.5 kg).  Constitutional: overall normal hygiene, normal nutrition, well developed, normal grooming, normal body habitus. Assistive device:none  Musculoskeletal: gait and station Limp none, muscle tone and strength are normal, no tremors or atrophy is present.  .  Neurological: coordination overall normal.  Deep tendon reflex/nerve stretch intact.  Sensation normal.  Cranial nerves II-XII intact.   Skin:   Normal overall no scars, lesions, ulcers or  rashes. No psoriasis.  Psychiatric: Alert and oriented x 3.  Recent memory intact, remote memory unclear.  Normal mood and affect. Well groomed.  Good eye contact.  Cardiovascular: overall no swelling, no varicosities, no edema bilaterally, normal temperatures of the legs and arms, no clubbing, cyanosis and good capillary refill.  Lymphatic: palpation is normal.  All other systems reviewed and are negative   Examination of right Upper Extremity is done.  Inspection:   Overall:  Elbow non-tender without crepitus or defects, forearm non-tender without crepitus or defects, wrist non-tender without crepitus or defects, hand non-tender.    Shoulder: with glenohumeral joint tenderness, without effusion.   Upper arm: without swelling and tenderness   Range of motion:   Overall:  Full range of motion of the elbow, full range of motion of wrist and full range of motion in fingers.   Shoulder:  right  170 degrees forward flexion; 160 degrees abduction; 35 degrees internal rotation, 35 degrees external rotation, 15 degrees extension, 40 degrees adduction.   Stability:   Overall:  Shoulder, elbow and wrist stable   Strength and Tone:   Overall full shoulder muscles strength, full upper arm strength and normal upper arm bulk and tone.  The patient has been educated about the nature of the problem(s) and counseled on treatment options.  The patient appeared to understand what I have discussed and is in agreement with it.  Encounter Diagnoses  Name Primary?  . Pain in joint of right shoulder Yes  . Neck pain on right side     PLAN Call if any problems.  Precautions discussed.  Continue current medications.   Return to clinic 1 month   Continue PT.  Electronically Signed Sanjuana Kava, MD 12/5/20182:06 PM

## 2017-11-28 ENCOUNTER — Ambulatory Visit (HOSPITAL_COMMUNITY): Payer: PPO

## 2017-11-29 ENCOUNTER — Telehealth (HOSPITAL_COMMUNITY): Payer: Self-pay | Admitting: Family Medicine

## 2017-11-29 NOTE — Telephone Encounter (Signed)
11/29/17  I left a message to see if patient wanted to come for a 2nd time this week since she missed the 12/10 date.

## 2017-11-30 ENCOUNTER — Other Ambulatory Visit: Payer: Self-pay

## 2017-11-30 ENCOUNTER — Ambulatory Visit (HOSPITAL_COMMUNITY): Payer: PPO | Admitting: Occupational Therapy

## 2017-11-30 ENCOUNTER — Encounter (HOSPITAL_COMMUNITY): Payer: Self-pay | Admitting: Occupational Therapy

## 2017-11-30 DIAGNOSIS — M25511 Pain in right shoulder: Secondary | ICD-10-CM

## 2017-11-30 DIAGNOSIS — R29898 Other symptoms and signs involving the musculoskeletal system: Secondary | ICD-10-CM

## 2017-11-30 DIAGNOSIS — M542 Cervicalgia: Secondary | ICD-10-CM | POA: Diagnosis not present

## 2017-11-30 NOTE — Patient Instructions (Signed)

## 2017-11-30 NOTE — Therapy (Signed)
[facility] [place], [place], [address] Phone: [phone]   Fax:  [phone]  Occupational Therapy Treatment  Patient Details  Name: Alyssa Durham MRN: [id number] Date of Birth: [birth year] Referring Provider (Historical): Dr. Sanjuana Kava   Encounter Date: 11/30/2017  OT End of Session - 11/30/17 1609    Visit Number  9    Number of Visits  16    Date for OT Re-Evaluation  12/21/17    Authorization Type  [employer]    Authorization Time Period  Before 17th visit    Authorization - Visit Number  9    Authorization - Number of Visits  36    OT Start Time  1438 pt arrived late    OT Stop Time  1516    OT Time Calculation (min)  38 min    Activity Tolerance  Patient tolerated treatment well    Behavior During Therapy  [facility] for tasks assessed/performed       Past Medical History:  Diagnosis Date  . Allergy   . Anxiety and depression   . Arthritis   . Asthma   . Cataract   . Chronic active type B viral hepatitis ([place])   . Depression   . HLD (hyperlipidemia) 05/12/2017  . Migraines   . Osteoporosis     Past Surgical History:  Procedure Laterality Date  . CESAREAN SECTION    . URETHRAL DILATION    . WISDOM TOOTH EXTRACTION      There were no vitals filed for this visit.  Subjective Assessment - 11/30/17 1440    Subjective   S: It's doing about the same, my neck might be a little more uncomfortable.     Currently in Pain?  Yes    Pain Score  3     Pain Location  Neck    Pain Orientation  Left    Pain Descriptors / Indicators  Aching;Sore    Pain Type  Acute pain    Pain Radiating Towards  none    Pain Onset  More than a month ago    Pain Frequency  Constant    Aggravating Factors   certain movements    Pain Relieving Factors  rest, pain medication, heat    Effect of Pain on Daily Activities  mod effect on ADLs    Multiple Pain Sites  No         [facility] OT Assessment - 11/30/17 1440      Assessment   Medical Diagnosis  right shoulder pain      Precautions   Precautions  None               OT Treatments/Exercises (OP) - 11/30/17 1442      Exercises   Exercises  Shoulder;Neck      Neck Exercises: Stretches   Levator Stretch  2 reps;10 seconds both directions    Other Neck Stretches  Cervical rotation stretch both sides with towel as assistance; 3 sets; 10 seconds.       Neck Exercises: Seated   X to V  10 reps    W Back  10 reps      Neck Exercises: Supine   Cervical Rotation  Both;5 reps P/ROM    Lateral Flexion  Both;5 reps P/ROM      Shoulder Exercises: Supine   Protraction  PROM;5 reps    Horizontal ABduction  PROM;5 reps    External Rotation  PROM;5 reps  Internal Rotation  PROM;5 reps    Flexion  PROM;5 reps    ABduction  PROM;5 reps      Shoulder Exercises: Standing   Extension  Theraband;10 reps    Theraband Level (Shoulder Extension)  Level 2 (Red)    Row  Theraband;10 reps    Theraband Level (Shoulder Row)  Level 2 (Red)    Retraction  Theraband;10 reps    Theraband Level (Shoulder Retraction)  Level 2 (Red)      Shoulder Exercises: ROM/Strengthening   Proximal Shoulder Strengthening, Seated  10X each no rest breaks      Manual Therapy   Manual Therapy  Myofascial release    Manual therapy comments  Completed separately from therapeutic exercises     Myofascial Release  Myofascial release to cervical neck and trapezius regions to decrease pain and fascial restrictions and increase joint range of motion.              OT Education - 11/30/17 1608    Education provided  Yes    Education Details  scapular theraband    Person(s) Educated  Patient    Methods  Explanation;Demonstration;Handout    Comprehension  Verbalized understanding;Returned demonstration       OT Short Term Goals - 11/21/17 1506      OT SHORT TERM GOAL #1   Title  Pt will be educated on and independent in HEP to improve mobility required for  functional reaching with RUE.     Time  4    Period  Weeks    Status  Achieved      OT SHORT TERM GOAL #2   Title  Pt will return to highest level of functioning using RUE as dominant during ADL completion.     Time  4    Period  Weeks    Status  On-going      OT SHORT TERM GOAL #3   Title  Pt will decrease fascial restrictions in RUE from max to minimal amounts or less to improve mobility required for overhead reaching.     Time  4    Period  Weeks    Status  Achieved      OT SHORT TERM GOAL #4   Title  Pt will decrease RUE and cervical neck pain to 3/10 or less to improve ability to sleep comfortably.     Time  4    Period  Weeks    Status  On-going      OT SHORT TERM GOAL #5   Title  Pt will improve A/ROM of RUE to WNL to improve ability to complete bathing tasks using RUE as dominant.     Time  4    Period  Weeks    Status  Partially Met      OT SHORT TERM GOAL #6   Title  Pt will improve RUE strength to 5/5 to increase ability to complete furniture refinishing tasks.     Time  4    Period  Weeks    Status  Partially Met      OT SHORT TERM GOAL #7   Title  Pt will improve cervical neck ROM to [facility] to improve ability to turn head when driving.     Time  4    Period  Weeks    Status  Partially Met               Plan - 11/30/17 1609    Clinical Impression  Statement  A: Pt reports cervical neck pain off and on today, no pain in RUE, therefor session focusing on cervical neck ROM and stretching. Continued with ROM exercises, resumed levator stretch which pt reports felt good. Updated HEP for scapular theraband.     Plan  P: Attempt 1# weight with A/ROM if pt able to tolerate, follow up on scapular theraband HEP    OT Home Exercise Plan  11/2: table slides; 11/8: A/ROM; 12/12: scapular theraband       Patient will benefit from skilled therapeutic intervention in order to improve the following deficits and impairments:  Impaired flexibility, Decreased strength,  Decreased activity tolerance, Pain, Decreased range of motion, Increased fascial restrictions, Impaired UE functional use  Visit Diagnosis: Cervicalgia  Other symptoms and signs involving the musculoskeletal system  Acute pain of right shoulder    Problem List Patient Active Problem List   Diagnosis Date Noted  . History of HPV infection 11/15/2017  . HLD (hyperlipidemia) 05/12/2017  . History of non anemic vitamin B12 deficiency 05/12/2017  . RBBB 05/12/2017  . Calcification of aorta (HCC) 04/04/2017  . Anxiety and depression 03/29/2017  . Chronic hepatitis B ([place]) 03/29/2017  . Lichen sclerosus of female genitalia 03/29/2017  . Osteoporosis 03/29/2017  . Chronic migraine 08/16/2016   Guadelupe Sabin, OTR/L  [phone] 11/30/2017, 4:11 PM  [facility] [address] [place], [place], [address] Phone: [phone]   Fax:  [phone]  Name: Alyssa Durham MRN: [id number] Date of Birth: 1951/07/23

## 2017-12-02 ENCOUNTER — Ambulatory Visit (HOSPITAL_COMMUNITY): Payer: PPO | Admitting: Occupational Therapy

## 2017-12-02 ENCOUNTER — Telehealth (HOSPITAL_COMMUNITY): Payer: Self-pay | Admitting: Occupational Therapy

## 2017-12-02 NOTE — Telephone Encounter (Signed)
Patient is suffering from a headache and will see [place] on next week.

## 2017-12-05 ENCOUNTER — Ambulatory Visit (HOSPITAL_COMMUNITY): Payer: PPO

## 2017-12-05 ENCOUNTER — Encounter (HOSPITAL_COMMUNITY): Payer: Self-pay

## 2017-12-05 DIAGNOSIS — R29898 Other symptoms and signs involving the musculoskeletal system: Secondary | ICD-10-CM

## 2017-12-05 DIAGNOSIS — M542 Cervicalgia: Secondary | ICD-10-CM | POA: Diagnosis not present

## 2017-12-05 DIAGNOSIS — M25511 Pain in right shoulder: Secondary | ICD-10-CM

## 2017-12-05 NOTE — Therapy (Signed)
[facility] [place], [place], [address] Phone: [phone]   Fax:  [phone]  Occupational Therapy Treatment  Patient Details  Name: Alyssa Durham MRN: [id number] Date of Birth: [birth year] Referring Provider (Historical): Dr. Sanjuana Kava   Encounter Date: 12/05/2017  OT End of Session - 12/05/17 1619    Visit Number  10    Number of Visits  16    Date for OT Re-Evaluation  12/21/17    Authorization Type  [employer]    Authorization Time Period  Before 17th visit    Authorization - Visit Number  10    Authorization - Number of Visits  17    OT Start Time  8280    OT Stop Time  1611    OT Time Calculation (min)  41 min    Activity Tolerance  Patient tolerated treatment well    Behavior During Therapy  [facility] for tasks assessed/performed       Past Medical History:  Diagnosis Date  . Allergy   . Anxiety and depression   . Arthritis   . Asthma   . Cataract   . Chronic active type B viral hepatitis ([place])   . Depression   . HLD (hyperlipidemia) 05/12/2017  . Migraines   . Osteoporosis     Past Surgical History:  Procedure Laterality Date  . CESAREAN SECTION    . URETHRAL DILATION    . WISDOM TOOTH EXTRACTION      There were no vitals filed for this visit.  Subjective Assessment - 12/05/17 1556    Subjective   S: My arms were killing me for days after last session.     Currently in Pain?  Yes    Pain Score  2     Pain Location  Shoulder    Pain Orientation  Left    Pain Descriptors / Indicators  Aching;Sore    Pain Type  Acute pain         [facility] OT Assessment - 12/05/17 1557      Assessment   Medical Diagnosis  right shoulder pain      Precautions   Precautions  None               OT Treatments/Exercises (OP) - 12/05/17 1557      Exercises   Exercises  Shoulder;Neck      Neck Exercises: Stretches   Upper Trapezius Stretch  2 reps;10 seconds    Levator Stretch  2  reps;10 seconds both directions    Lower Cervical/Upper Thoracic Stretch  2 reps;10 seconds both directions      Shoulder Exercises: Supine   Protraction  PROM;5 reps    Horizontal ABduction  PROM;5 reps    External Rotation  PROM;5 reps    Internal Rotation  PROM;5 reps    Flexion  PROM;5 reps    ABduction  PROM;5 reps      Shoulder Exercises: Seated   Protraction  Strengthening;10 reps    Protraction Weight (lbs)  1    Horizontal ABduction  Strengthening;10 reps    Horizontal ABduction Weight (lbs)  1    External Rotation  Strengthening;10 reps    External Rotation Weight (lbs)  1    Internal Rotation  Strengthening;10 reps    Internal Rotation Weight (lbs)  1    Flexion  Strengthening;10 reps    Flexion Weight (lbs)  1    Abduction  Strengthening;10 reps  ABduction Weight (lbs)  1      Shoulder Exercises: ROM/Strengthening   UBE (Upper Arm Bike)  Level 1 3' reverse    "W" Arms  10X    X to V Arms  10X      Manual Therapy   Manual Therapy  Myofascial release    Manual therapy comments  Completed separately from therapeutic exercises     Myofascial Release  Myofascial release to cervical neck and trapezius regions to decrease pain and fascial restrictions and increase joint range of motion.                OT Short Term Goals - 12/05/17 1650      OT SHORT TERM GOAL #1   Title  Pt will be educated on and independent in HEP to improve mobility required for functional reaching with RUE.     Time  4    Period  Weeks      OT SHORT TERM GOAL #2   Title  Pt will return to highest level of functioning using RUE as dominant during ADL completion.     Time  4    Period  Weeks    Status  On-going      OT SHORT TERM GOAL #3   Title  Pt will decrease fascial restrictions in RUE from max to minimal amounts or less to improve mobility required for overhead reaching.     Time  4    Period  Weeks      OT SHORT TERM GOAL #4   Title  Pt will decrease RUE and cervical  neck pain to 3/10 or less to improve ability to sleep comfortably.     Time  4    Period  Weeks    Status  On-going      OT SHORT TERM GOAL #5   Title  Pt will improve A/ROM of RUE to WNL to improve ability to complete bathing tasks using RUE as dominant.     Time  4    Period  Weeks    Status  Partially Met      OT SHORT TERM GOAL #6   Title  Pt will improve RUE strength to 5/5 to increase ability to complete furniture refinishing tasks.     Time  4    Period  Weeks    Status  Partially Met      OT SHORT TERM GOAL #7   Title  Pt will improve cervical neck ROM to [facility] to improve ability to turn head when driving.     Time  4    Period  Weeks    Status  Partially Met               Plan - 12/05/17 1647    Clinical Impression Statement  A: Patient reports pain from last session. During session we progressed to 1# handweights seated. patient was able to complete although she reports pain in anterior and medial deltoid region of right arm. VC for form and technique.    Plan  P: Continue with strengthening. Attempt using yellow or red band if that is more comfortable for patient.        Patient will benefit from skilled therapeutic intervention in order to improve the following deficits and impairments:  Impaired flexibility, Decreased strength, Decreased activity tolerance, Pain, Decreased range of motion, Increased fascial restrictions, Impaired UE functional use  Visit Diagnosis: Cervicalgia  Other symptoms and signs involving the musculoskeletal  system  Acute pain of right shoulder    Problem List Patient Active Problem List   Diagnosis Date Noted  . History of HPV infection 11/15/2017  . HLD (hyperlipidemia) 05/12/2017  . History of non anemic vitamin B12 deficiency 05/12/2017  . RBBB 05/12/2017  . Calcification of aorta (HCC) 04/04/2017  . Anxiety and depression 03/29/2017  . Chronic hepatitis B ([place]) 03/29/2017  . Lichen sclerosus of female genitalia  03/29/2017  . Osteoporosis 03/29/2017  . Chronic migraine 08/16/2016   Ailene Ravel, OTR/L,CBIS  [phone]  12/05/2017, 4:51 PM  [facility] [address] [place], [place], [address] Phone: [phone]   Fax:  [phone]  Name: Alyssa Durham MRN: [id number] Date of Birth: Jan 23, 1951

## 2017-12-06 ENCOUNTER — Telehealth (HOSPITAL_COMMUNITY): Payer: Self-pay | Admitting: Family Medicine

## 2017-12-06 NOTE — Telephone Encounter (Signed)
12/06/17  patient left a message to cx - said that she has to much to do to get ready for Christmas

## 2017-12-07 ENCOUNTER — Encounter (HOSPITAL_COMMUNITY): Payer: PPO | Admitting: Occupational Therapy

## 2017-12-09 ENCOUNTER — Ambulatory Visit: Payer: PPO | Admitting: Neurology

## 2017-12-15 ENCOUNTER — Telehealth (HOSPITAL_COMMUNITY): Payer: Self-pay | Admitting: Occupational Therapy

## 2017-12-15 ENCOUNTER — Ambulatory Visit (HOSPITAL_COMMUNITY): Payer: PPO | Admitting: Occupational Therapy

## 2017-12-15 NOTE — Telephone Encounter (Signed)
Pt still has family in for Christmas and will not be here today

## 2017-12-16 ENCOUNTER — Ambulatory Visit (HOSPITAL_COMMUNITY): Payer: PPO | Admitting: Occupational Therapy

## 2017-12-16 ENCOUNTER — Other Ambulatory Visit: Payer: Self-pay

## 2017-12-16 ENCOUNTER — Encounter (HOSPITAL_COMMUNITY): Payer: Self-pay | Admitting: Occupational Therapy

## 2017-12-16 DIAGNOSIS — M542 Cervicalgia: Secondary | ICD-10-CM

## 2017-12-16 DIAGNOSIS — R29898 Other symptoms and signs involving the musculoskeletal system: Secondary | ICD-10-CM

## 2017-12-16 DIAGNOSIS — M25511 Pain in right shoulder: Secondary | ICD-10-CM

## 2017-12-16 NOTE — Therapy (Signed)
[place], [place], [address] Phone: [phone]   Fax:  [phone]  Occupational Therapy Treatment  Patient Details  Name: Alyssa Durham MRN: [id number] Date of Birth: [birth year] Referring Provider (Historical): Dr. Sanjuana Kava   Encounter Date: 12/16/2017  OT End of Session - 12/16/17 1511    Visit Number  11    Number of Visits  16    Date for OT Re-Evaluation  12/21/17    Authorization Type  [employer]    Authorization Time Period  Before 17th visit    Authorization - Visit Number  11    Authorization - Number of Visits  17    OT Start Time  1427    OT Stop Time  1510    OT Time Calculation (min)  43 min    Activity Tolerance  Patient tolerated treatment well    Behavior During Therapy  [facility] for tasks assessed/performed       Past Medical History:  Diagnosis Date  . Allergy   . Anxiety and depression   . Arthritis   . Asthma   . Cataract   . Chronic active type B viral hepatitis ([place])   . Depression   . HLD (hyperlipidemia) 05/12/2017  . Migraines   . Osteoporosis     Past Surgical History:  Procedure Laterality Date  . CESAREAN SECTION    . URETHRAL DILATION    . WISDOM TOOTH EXTRACTION      There were no vitals filed for this visit.  Subjective Assessment - 12/16/17 1426    Subjective   S: My arms were sore for 2 days after vacuuming.     Currently in Pain?  Yes    Pain Score  2     Pain Location  Shoulder    Pain Orientation  Left    Pain Descriptors / Indicators  Aching;Sore    Pain Type  Acute pain    Pain Radiating Towards  none    Pain Onset  More than a month ago    Pain Frequency  Constant    Aggravating Factors   certain movements, household chores    Pain Relieving Factors  rest, pain medication, heat    Effect of Pain on Daily Activities  mod effect on ADLs    Multiple Pain Sites  No         [facility] OT Assessment - 12/16/17 1425      Assessment   Medical Diagnosis  right shoulder pain      Precautions   Precautions  None               OT Treatments/Exercises (OP) - 12/16/17 1432      Exercises   Exercises  Shoulder;Neck      Shoulder Exercises: Supine   Protraction  PROM;5 reps    Horizontal ABduction  PROM;5 reps    External Rotation  PROM;5 reps    Internal Rotation  PROM;5 reps    Flexion  PROM;5 reps    ABduction  PROM;5 reps      Shoulder Exercises: Seated   Protraction  Strengthening;10 reps    Protraction Weight (lbs)  1    Horizontal ABduction  Strengthening;10 reps    Horizontal ABduction Weight (lbs)  1    External Rotation  Strengthening;10 reps    External Rotation Weight (lbs)  1    Internal Rotation  Strengthening;10 reps    Internal Rotation Weight (lbs)  1    Flexion  Strengthening;10 reps    Flexion Weight (lbs)  1    Abduction  Strengthening;10 reps    ABduction Weight (lbs)  1      Shoulder Exercises: Standing   Extension  Theraband;10 reps    Theraband Level (Shoulder Extension)  Level 2 (Red)    Row  Theraband;10 reps    Theraband Level (Shoulder Row)  Level 2 (Red)    Retraction  Theraband;10 reps    Theraband Level (Shoulder Retraction)  Level 2 (Red)      Shoulder Exercises: ROM/Strengthening   Over Head Lace  1'    X to V Arms  10X    Proximal Shoulder Strengthening, Seated  10X each no rest breaks      Manual Therapy   Manual Therapy  Myofascial release    Manual therapy comments  Completed separately from therapeutic exercises     Myofascial Release  Myofascial release to cervical neck and trapezius regions to decrease pain and fascial restrictions and increase joint range of motion.                OT Short Term Goals - 12/05/17 1650      OT SHORT TERM GOAL #1   Title  Pt will be educated on and independent in HEP to improve mobility required for functional reaching with RUE.     Time  4    Period  Weeks      OT SHORT TERM GOAL #2   Title  Pt will return  to highest level of functioning using RUE as dominant during ADL completion.     Time  4    Period  Weeks    Status  On-going      OT SHORT TERM GOAL #3   Title  Pt will decrease fascial restrictions in RUE from max to minimal amounts or less to improve mobility required for overhead reaching.     Time  4    Period  Weeks      OT SHORT TERM GOAL #4   Title  Pt will decrease RUE and cervical neck pain to 3/10 or less to improve ability to sleep comfortably.     Time  4    Period  Weeks    Status  On-going      OT SHORT TERM GOAL #5   Title  Pt will improve A/ROM of RUE to WNL to improve ability to complete bathing tasks using RUE as dominant.     Time  4    Period  Weeks    Status  Partially Met      OT SHORT TERM GOAL #6   Title  Pt will improve RUE strength to 5/5 to increase ability to complete furniture refinishing tasks.     Time  4    Period  Weeks    Status  Partially Met      OT SHORT TERM GOAL #7   Title  Pt will improve cervical neck ROM to [facility] to improve ability to turn head when driving.     Time  4    Period  Weeks    Status  Partially Met               Plan - 12/16/17 1452    Clinical Impression Statement  A: Pt reporting soreness in BUE, right more than left, after household chores this week. Pt reports her neck is doing much better, right shoulder is  the problem at this time. Continued with strengthening this session with 1# weight, added overhead lacing and continued with red theraband, verbal cuing for form and technique. Min/mod fatigue reported at end of session, no increase in pain. Encouraged pt to resume TENS unit at home and resume HEP.     Plan  P: Follow up on HEP and TENS unit use. Continue with strengthening and attempt sidelying exercises if pt able to complete with good form and minimal pain    Consulted and Agree with Plan of Care  Patient       Patient will benefit from skilled therapeutic intervention in order to improve the following  deficits and impairments:  Impaired flexibility, Decreased strength, Decreased activity tolerance, Pain, Decreased range of motion, Increased fascial restrictions, Impaired UE functional use  Visit Diagnosis: Cervicalgia  Other symptoms and signs involving the musculoskeletal system  Acute pain of right shoulder    Problem List Patient Active Problem List   Diagnosis Date Noted  . History of HPV infection 11/15/2017  . HLD (hyperlipidemia) 05/12/2017  . History of non anemic vitamin B12 deficiency 05/12/2017  . RBBB 05/12/2017  . Calcification of aorta (HCC) 04/04/2017  . Anxiety and depression 03/29/2017  . Chronic hepatitis B ([place]) 03/29/2017  . Lichen sclerosus of female genitalia 03/29/2017  . Osteoporosis 03/29/2017  . Chronic migraine 08/16/2016   Guadelupe Sabin, OTR/L  [phone] 12/16/2017, 3:13 PM  [place] [address] [place], [place], [address] Phone: [phone]   Fax:  [phone]  Name: Alyssa Durham MRN: [id number] Date of Birth: [birth year]

## 2017-12-19 ENCOUNTER — Encounter (HOSPITAL_COMMUNITY): Payer: Self-pay | Admitting: Specialist

## 2017-12-19 ENCOUNTER — Ambulatory Visit (HOSPITAL_COMMUNITY): Payer: PPO | Admitting: Specialist

## 2017-12-19 DIAGNOSIS — M542 Cervicalgia: Secondary | ICD-10-CM | POA: Diagnosis not present

## 2017-12-19 DIAGNOSIS — M25511 Pain in right shoulder: Secondary | ICD-10-CM

## 2017-12-19 DIAGNOSIS — R29898 Other symptoms and signs involving the musculoskeletal system: Secondary | ICD-10-CM

## 2017-12-19 NOTE — Progress Notes (Signed)
Initiated PA for Botox on cover my meds key: [id number]

## 2017-12-19 NOTE — Therapy (Signed)
[place], [place], [address] Phone: [phone]   Fax:  [phone]  Occupational Therapy Treatment  Patient Details  Name: Alyssa Durham MRN: [id number] Date of Birth: 30-Sep-1951 Referring Provider (Historical): Dr. Sanjuana Kava   Encounter Date: 12/19/2017  OT End of Session - 12/19/17 1445    Visit Number  12    Number of Visits  16    Date for OT Re-Evaluation  12/21/17    Authorization Type  [employer]    Authorization Time Period  Before 17th visit    Authorization - Visit Number  12    Authorization - Number of Visits  17    OT Start Time  1351    OT Stop Time  1430    OT Time Calculation (min)  39 min    Activity Tolerance  Patient tolerated treatment well    Behavior During Therapy  [facility] for tasks assessed/performed       Past Medical History:  Diagnosis Date  . Allergy   . Anxiety and depression   . Arthritis   . Asthma   . Cataract   . Chronic active type B viral hepatitis ([place])   . Depression   . HLD (hyperlipidemia) 05/12/2017  . Migraines   . Osteoporosis     Past Surgical History:  Procedure Laterality Date  . CESAREAN SECTION    . URETHRAL DILATION    . WISDOM TOOTH EXTRACTION      There were no vitals filed for this visit.  Subjective Assessment - 12/19/17 1444    Subjective   S:  I wonder if some of my soreness is just from being weak and building up my strength?    Currently in Pain?  Yes    Pain Score  4     Pain Location  Shoulder    Pain Orientation  Right    Pain Descriptors / Indicators  Aching    Pain Type  Acute pain    Pain Onset  More than a month ago         [facility] OT Assessment - 12/19/17 0001      Assessment   Medical Diagnosis  right shoulder pain               OT Treatments/Exercises (OP) - 12/19/17 0001      Exercises   Exercises  Shoulder;Neck      Shoulder Exercises: Supine   Protraction  PROM;5 reps    Horizontal  ABduction  PROM;5 reps    External Rotation  PROM;5 reps    Internal Rotation  PROM;5 reps    Flexion  PROM;5 reps    ABduction  PROM;5 reps      Shoulder Exercises: Sidelying   External Rotation  Strengthening;10 reps    External Rotation Weight (lbs)  1    Internal Rotation  Strengthening;10 reps    Internal Rotation Weight (lbs)  1    Flexion  Strengthening;10 reps    Flexion Weight (lbs)  1    ABduction  Strengthening;10 reps    ABduction Weight (lbs)  1      Shoulder Exercises: Standing   Protraction  Strengthening;10 reps    Protraction Weight (lbs)  1    Horizontal ABduction  Strengthening;10 reps    Horizontal ABduction Weight (lbs)  1    External Rotation  Strengthening;10 reps    External Rotation Weight (lbs)  1    Internal Rotation  Strengthening;10 reps    Internal Rotation Weight (lbs)  1    Flexion  Strengthening;10 reps    Shoulder Flexion Weight (lbs)  1    ABduction  Strengthening;10 reps    Shoulder ABduction Weight (lbs)  1      Shoulder Exercises: ROM/Strengthening   UBE (Upper Arm Bike)  level 1 3 minutes reverse only    "W" Arms  10X    X to V Arms  10X    Proximal Shoulder Strengthening, Seated  10X each no rest breaks    Ball on Wall  1' with arm flexed to 90 and 1' with arm abducted to 90      Manual Therapy   Manual Therapy  Myofascial release    Manual therapy comments  Completed separately from therapeutic exercises     Myofascial Release  Myofascial release to cervical neck and trapezius regions to decrease pain and fascial restrictions and increase joint range of motion.                OT Short Term Goals - 12/05/17 1650      OT SHORT TERM GOAL #1   Title  Pt will be educated on and independent in HEP to improve mobility required for functional reaching with RUE.     Time  4    Period  Weeks      OT SHORT TERM GOAL #2   Title  Pt will return to highest level of functioning using RUE as dominant during ADL completion.     Time   4    Period  Weeks    Status  On-going      OT SHORT TERM GOAL #3   Title  Pt will decrease fascial restrictions in RUE from max to minimal amounts or less to improve mobility required for overhead reaching.     Time  4    Period  Weeks      OT SHORT TERM GOAL #4   Title  Pt will decrease RUE and cervical neck pain to 3/10 or less to improve ability to sleep comfortably.     Time  4    Period  Weeks    Status  On-going      OT SHORT TERM GOAL #5   Title  Pt will improve A/ROM of RUE to WNL to improve ability to complete bathing tasks using RUE as dominant.     Time  4    Period  Weeks    Status  Partially Met      OT SHORT TERM GOAL #6   Title  Pt will improve RUE strength to 5/5 to increase ability to complete furniture refinishing tasks.     Time  4    Period  Weeks    Status  Partially Met      OT SHORT TERM GOAL #7   Title  Pt will improve cervical neck ROM to [facility] to improve ability to turn head when driving.     Time  4    Period  Weeks    Status  Partially Met               Plan - 12/19/17 1445    Clinical Impression Statement  A:  Discussed pain level with patient, soreness more so than sharp pan or dull ache.  Soreness most likely due to exercise and normal process of building muscle mass.  Added 1# to seated and sidelying this date.  Able to  complete ball on wall and ube for scapular stability     Plan  P:  Increase repetitions with strengthening to 15 reps and add scapular theraband.         Patient will benefit from skilled therapeutic intervention in order to improve the following deficits and impairments:  Impaired flexibility, Decreased strength, Decreased activity tolerance, Pain, Decreased range of motion, Increased fascial restrictions, Impaired UE functional use  Visit Diagnosis: Cervicalgia  Other symptoms and signs involving the musculoskeletal system  Acute pain of right shoulder    Problem List Patient Active Problem List    Diagnosis Date Noted  . History of HPV infection 11/15/2017  . HLD (hyperlipidemia) 05/12/2017  . History of non anemic vitamin B12 deficiency 05/12/2017  . RBBB 05/12/2017  . Calcification of aorta (HCC) 04/04/2017  . Anxiety and depression 03/29/2017  . Chronic hepatitis B ([place]) 03/29/2017  . Lichen sclerosus of female genitalia 03/29/2017  . Osteoporosis 03/29/2017  . Chronic migraine 08/16/2016    Vangie Bicker, [place], OTR/L [phone]  12/19/2017, 2:48 PM  [facility] [address] [place], [place], [address] Phone: [phone]   Fax:  [phone]  Name: Alyssa Durham MRN: [id number] Date of Birth: Jul 26, 1951

## 2017-12-21 ENCOUNTER — Ambulatory Visit (HOSPITAL_COMMUNITY): Payer: PPO | Attending: Orthopaedic Surgery

## 2017-12-21 ENCOUNTER — Telehealth: Payer: Self-pay | Admitting: Neurology

## 2017-12-21 ENCOUNTER — Other Ambulatory Visit: Payer: Self-pay | Admitting: *Deleted

## 2017-12-21 ENCOUNTER — Encounter (HOSPITAL_COMMUNITY): Payer: Self-pay

## 2017-12-21 DIAGNOSIS — R29898 Other symptoms and signs involving the musculoskeletal system: Secondary | ICD-10-CM

## 2017-12-21 DIAGNOSIS — M542 Cervicalgia: Secondary | ICD-10-CM

## 2017-12-21 DIAGNOSIS — M25511 Pain in right shoulder: Secondary | ICD-10-CM

## 2017-12-21 NOTE — Therapy (Signed)
[place], [place], [address] Phone: [phone]   Fax:  [phone]  Occupational Therapy Treatment  Patient Details  Name: Alyssa Durham MRN: [id number] Date of Birth: [birth year] Referring Provider (Historical): Dr. Sanjuana Kava   Encounter Date: 12/21/2017  OT End of Session - 12/21/17 1512    Visit Number  13    Number of Visits  21    Date for OT Re-Evaluation  01/20/18    Authorization Type  [employer]    OT Start Time  1435 reassessment    OT Stop Time  1515    OT Time Calculation (min)  40 min    Activity Tolerance  Patient tolerated treatment well    Behavior During Therapy  [facility] for tasks assessed/performed       Past Medical History:  Diagnosis Date  . Allergy   . Anxiety and depression   . Arthritis   . Asthma   . Cataract   . Chronic active type B viral hepatitis ([place])   . Depression   . HLD (hyperlipidemia) 05/12/2017  . Migraines   . Osteoporosis     Past Surgical History:  Procedure Laterality Date  . CESAREAN SECTION    . URETHRAL DILATION    . WISDOM TOOTH EXTRACTION      There were no vitals filed for this visit.  Subjective Assessment - 12/21/17 1509    Subjective   S: My shoulder feels heavy still. Not as much as it used to.    Currently in Pain?  Yes    Pain Score  4     Pain Orientation  Right    Pain Descriptors / Indicators  Aching    Pain Type  Acute pain    Pain Radiating Towards  None    Pain Onset  More than a month ago    Pain Frequency  Intermittent    Aggravating Factors   Certain movements, household chores    Pain Relieving Factors  rest, pain medication, heat    Effect of Pain on Daily Activities  min-mod effect    Multiple Pain Sites  No         [facility] OT Assessment - 12/21/17 1440      Assessment   Medical Diagnosis  right shoulder pain    Referring Provider  Dr. Sanjuana Kava    Onset Date/Surgical Date  09/19/17      Precautions    Precautions  None      Prior Function   Level of Independence  Independent      AROM   Overall AROM Comments  Assessed seated, er/IR adducte    AROM Assessment Site  Shoulder;Cervical    Right/Left Shoulder  Right    Right Shoulder Flexion  180 Degrees previous: 151    Right Shoulder ABduction  140 Degrees previous: 102    Right Shoulder Internal Rotation  90 Degrees previous: same    Right Shoulder External Rotation  90 Degrees previous: 90    Cervical - Left Side Bend  35 previous: 20    Cervical - Right Rotation  75 previous: 66    Cervical - Left Rotation  66 previous: 45      Strength   Overall Strength Comments  Assessed seated, er/IR adducted    Strength Assessment Site  Shoulder    Right/Left Shoulder  Right    Right Shoulder Flexion  4-/5 previous: 3+/5    Right Shoulder  ABduction  4-/5 previous: 3+/5               OT Treatments/Exercises (OP) - 12/21/17 1511      Exercises   Exercises  Shoulder;Neck      Shoulder Exercises: Supine   Protraction  PROM;5 reps    Horizontal ABduction  PROM;5 reps    External Rotation  PROM;5 reps    Internal Rotation  PROM;5 reps    Flexion  PROM;5 reps    ABduction  PROM;5 reps      Shoulder Exercises: Seated   Protraction  Strengthening;15 reps    Protraction Weight (lbs)  1    Horizontal ABduction  Strengthening;15 reps    Horizontal ABduction Weight (lbs)  1    External Rotation  Strengthening;15 reps    External Rotation Weight (lbs)  1    Internal Rotation  Strengthening;15 reps    Internal Rotation Weight (lbs)  1    Flexion  Strengthening;15 reps    Flexion Weight (lbs)  1    Abduction  Strengthening;15 reps    ABduction Weight (lbs)  1      Shoulder Exercises: ROM/Strengthening   X to V Arms  10X with 1#    Proximal Shoulder Strengthening, Seated  15X with 1# no rest break      Manual Therapy   Manual Therapy  Myofascial release    Manual therapy comments  Completed separately from therapeutic  exercises     Myofascial Release  Myofascial release to cervical neck and trapezius regions to decrease pain and fascial restrictions and increase joint range of motion.              OT Education - 12/21/17 1631    Education provided  Yes    Education Details  Reviewed goals and progress in therapy.    Person(s) Educated  Patient    Methods  Explanation    Comprehension  Verbalized understanding       OT Short Term Goals - 12/21/17 1503      OT SHORT TERM GOAL #1   Title  Pt will be educated on and independent in HEP to improve mobility required for functional reaching with RUE.     Time  4    Period  Weeks      OT SHORT TERM GOAL #2   Title  Pt will return to highest level of functioning using RUE as dominant during ADL completion.     Time  4    Period  Weeks    Status  Achieved      OT SHORT TERM GOAL #3   Title  Pt will decrease fascial restrictions in RUE from max to minimal amounts or less to improve mobility required for overhead reaching.     Time  4    Period  Weeks      OT SHORT TERM GOAL #4   Title  Pt will decrease RUE and cervical neck pain to 3/10 or less to improve ability to sleep comfortably.     Time  4    Period  Weeks    Status  Achieved      OT SHORT TERM GOAL #5   Title  Pt will improve A/ROM of RUE to WNL to improve ability to complete bathing tasks using RUE as dominant.     Time  4    Period  Weeks    Status  Achieved      OT SHORT TERM GOAL #  6   Title  Pt will improve RUE strength to 5/5 to increase ability to complete furniture refinishing tasks.     Time  4    Period  Weeks    Status  Partially Met      OT SHORT TERM GOAL #7   Title  Pt will improve cervical neck ROM to [facility] to improve ability to turn head when driving.     Time  4    Period  Weeks    Status  Achieved               Plan - 12/21/17 1513    Clinical Impression Statement  A: reassessment completed this date. patient has met all goals except 1 goal for  RUE strength. patient feels that her strength is not where it should be and wishes to continue working on strength and stability in therapy. Pt reports that she has pain when she is completing activities that are weighted outside her center of gravity    Plan  P: Continue therapy 2 times a week for 4 weeks to focus on strengthening and stability of shoulder in order for her to be able to complete tasks that require more shoulder stability with decreased pain.    Consulted and Agree with Plan of Care  Patient       Patient will benefit from skilled therapeutic intervention in order to improve the following deficits and impairments:  Impaired flexibility, Decreased strength, Decreased activity tolerance, Pain, Decreased range of motion, Increased fascial restrictions, Impaired UE functional use  Visit Diagnosis: Cervicalgia  Other symptoms and signs involving the musculoskeletal system  Acute pain of right shoulder    Problem List Patient Active Problem List   Diagnosis Date Noted  . History of HPV infection 11/15/2017  . HLD (hyperlipidemia) 05/12/2017  . History of non anemic vitamin B12 deficiency 05/12/2017  . RBBB 05/12/2017  . Calcification of aorta (HCC) 04/04/2017  . Anxiety and depression 03/29/2017  . Chronic hepatitis B ([place]) 03/29/2017  . Lichen sclerosus of female genitalia 03/29/2017  . Osteoporosis 03/29/2017  . Chronic migraine 08/16/2016   Ailene Ravel, OTR/L,CBIS  [phone]  12/21/2017, 4:34 PM  [facility] [address] [place], [place], [address] Phone: [phone]   Fax:  [phone]  Name: Alyssa Durham MRN: [id number] Date of Birth: 1951-03-06

## 2017-12-22 ENCOUNTER — Ambulatory Visit: Payer: PPO | Admitting: Orthopaedic Surgery

## 2017-12-22 NOTE — Telephone Encounter (Signed)
[employer] called and said that the [employer] is needing a Prior Auth. Please Call [phone]. Thanks

## 2017-12-23 ENCOUNTER — Ambulatory Visit: Payer: PPO | Admitting: Neurology

## 2017-12-27 ENCOUNTER — Ambulatory Visit (INDEPENDENT_AMBULATORY_CARE_PROVIDER_SITE_OTHER): Payer: PPO | Admitting: Orthopaedic Surgery

## 2017-12-27 ENCOUNTER — Telehealth (HOSPITAL_COMMUNITY): Payer: Self-pay | Admitting: Family Medicine

## 2017-12-27 ENCOUNTER — Encounter: Payer: Self-pay | Admitting: Orthopaedic Surgery

## 2017-12-27 VITALS — BP 143/82 | HR 78 | Ht 62.0 in | Wt 175.0 lb

## 2017-12-27 DIAGNOSIS — M542 Cervicalgia: Secondary | ICD-10-CM | POA: Diagnosis not present

## 2017-12-27 DIAGNOSIS — M25511 Pain in right shoulder: Secondary | ICD-10-CM

## 2017-12-27 NOTE — Progress Notes (Signed)
Patient [name], female DOB:1951/09/26, 67 y.o. BZJ:[id number]  Chief Complaint  Patient presents with  . Shoulder Pain    right   . Neck Pain    HPI  Alyssa Durham is a 67 y.o. female who has pain of the right shoulder, chronically.  She is better and has been going to OT but still hurts.  Pain is less but it still hurts.  She is doing her exercises at home.  She is tired of hurting. HPI  Body mass index is 32.01 kg/m.  ROS  Review of Systems  HENT: Negative for congestion.   Respiratory: Positive for shortness of breath. Negative for cough.   Cardiovascular: Negative for chest pain and leg swelling.  Endocrine: Negative for cold intolerance.  Musculoskeletal: Positive for arthralgias and neck pain.  Allergic/Immunologic: Positive for environmental allergies.  Neurological: Positive for headaches.  Psychiatric/Behavioral: The patient is nervous/anxious.   All other systems reviewed and are negative.   Past Medical History:  Diagnosis Date  . Allergy   . Anxiety and depression   . Arthritis   . Asthma   . Cataract   . Chronic active type B viral hepatitis ([place])   . Depression   . HLD (hyperlipidemia) 05/12/2017  . Migraines   . Osteoporosis     Past Surgical History:  Procedure Laterality Date  . CESAREAN SECTION    . URETHRAL DILATION    . WISDOM TOOTH EXTRACTION      Family History  Problem Relation Age of Onset  . Uterine cancer Mother   . Cancer Mother        Breast cancer  . Migraines Father   . Early death Father 62       suicide   . Transient ischemic attack Father        multiple   . Migraines Brother   . Drug abuse Son        addict - stable on suboxone  . Hepatitis B Son   . Cancer Maternal Grandmother        breast  . Alcohol abuse Paternal Grandfather   . Early death Paternal Aunt        suicide  . Mental illness Paternal Aunt     Social History Social History   Tobacco Use  . Smoking status: Former Smoker   Packs/day: 2.00    Years: 5.00    Pack years: 10.00    Types: Cigarettes    Start date: 12/21/1967    Last attempt to quit: 12/20/1972    Years since quitting: 45.0  . Smokeless tobacco: Never Used  Substance Use Topics  . Alcohol use: No  . Drug use: No    Allergies  Allergen Reactions  . Codeine Nausea And Vomiting    Current Outpatient Medications  Medication Sig Dispense Refill  . BOTOX 200 units SOLR Inject 200 Units as directed every 3 (three) months.    . Clobetasol Prop Emollient Base 0.05 % emollient cream Apply topically 2 (two) times daily.    Marland Kitchen FLUoxetine (PROZAC) 10 MG tablet Take 1 tablet (10 mg total) by mouth daily. 90 tablet 3  . naproxen (NAPROSYN) 500 MG tablet Take 1 tablet (500 mg total) by mouth 2 (two) times daily with a meal. 60 tablet 5  . PROAIR HFA 108 (90 Base) MCG/ACT inhaler INHALE 2 PUFFS INTO THE LUNGS EVERY 6 HOURS AS NEEDED FOR WHEEZING OR SHORTNESS OF BREATH 18 g 1  . RELPAX 40 MG tablet  TAKE 1 TABLET BY MOUTH AT ONSET OF MIGRAINE. DO NOT TAKE MORE THAN 3 A WEEK 12 tablet 11  . traMADol (ULTRAM) 50 MG tablet Take 1 tablet (50 mg total) by mouth every 6 (six) hours as needed. 60 tablet 1   No current facility-administered medications for this visit.      Physical Exam  Blood pressure (!) 143/82, pulse 78, height 5\' 2"  (1.575 m), weight 175 lb (79.4 kg).  Constitutional: overall normal hygiene, normal nutrition, well developed, normal grooming, normal body habitus. Assistive device:none  Musculoskeletal: gait and station Limp none, muscle tone and strength are normal, no tremors or atrophy is present.  .  Neurological: coordination overall normal.  Deep tendon reflex/nerve stretch intact.  Sensation normal.  Cranial nerves II-XII intact.   Skin:   Normal overall no scars, lesions, ulcers or rashes. No psoriasis.  Psychiatric: Alert and oriented x 3.  Recent memory intact, remote memory unclear.  Normal mood and affect. Well groomed.  Good eye  contact.  Cardiovascular: overall no swelling, no varicosities, no edema bilaterally, normal temperatures of the legs and arms, no clubbing, cyanosis and good capillary refill.  Lymphatic: palpation is normal.  All other systems reviewed and are negative   The right shoulder has full motion but is tender in the extremes.  NV intact.  Neck has full ROM.  The patient has been educated about the nature of the problem(s) and counseled on treatment options.  The patient appeared to understand what I have discussed and is in agreement with it.  Encounter Diagnoses  Name Primary?  . Pain in joint of right shoulder Yes  . Neck pain on right side    I have reviewed the OT notes.  PLAN Call if any problems.  Precautions discussed.  Continue current medications.   Return to clinic 1 month   Continue OT.  Electronically Signed Sanjuana Kava, MD 1/8/20192:39 PM

## 2017-12-27 NOTE — Telephone Encounter (Signed)
12/27/17 pt left a message saying that Alyssa Durham called her to see if she could come in on Wed. At 4.  Pt said that she couldn't come in

## 2017-12-30 ENCOUNTER — Encounter (HOSPITAL_COMMUNITY): Payer: Self-pay

## 2017-12-30 ENCOUNTER — Ambulatory Visit (HOSPITAL_COMMUNITY): Payer: PPO

## 2017-12-30 DIAGNOSIS — R29898 Other symptoms and signs involving the musculoskeletal system: Secondary | ICD-10-CM

## 2017-12-30 DIAGNOSIS — M542 Cervicalgia: Secondary | ICD-10-CM

## 2017-12-30 DIAGNOSIS — M25511 Pain in right shoulder: Secondary | ICD-10-CM

## 2017-12-30 NOTE — Patient Instructions (Signed)
ELASTIC BAND SHOULDER EXTERNAL ROTATION - ER  While holding an elastic band at your side with your elbow bent, start with your hand near your stomach and then pull the band away. Keep your elbow at your side the entire time.  12-15 times.

## 2017-12-30 NOTE — Therapy (Signed)
[facility] [place], [place], [address] Phone: [phone]   Fax:  [phone]  Occupational Therapy Treatment  Patient Details  Name: Alyssa Durham MRN: [id number] Date of Birth: 1951/12/09 Referring Provider: Dr. Sanjuana Kava   Encounter Date: 12/30/2017  OT End of Session - 12/30/17 1510    Visit Number  14    Number of Visits  21    Date for OT Re-Evaluation  01/20/18    Authorization Type  [employer]    OT Start Time  1445    OT Stop Time  1530    OT Time Calculation (min)  45 min    Activity Tolerance  Patient tolerated treatment well    Behavior During Therapy  [facility] for tasks assessed/performed       Past Medical History:  Diagnosis Date  . Allergy   . Anxiety and depression   . Arthritis   . Asthma   . Cataract   . Chronic active type B viral hepatitis ([place])   . Depression   . HLD (hyperlipidemia) 05/12/2017  . Migraines   . Osteoporosis     Past Surgical History:  Procedure Laterality Date  . CESAREAN SECTION    . URETHRAL DILATION    . WISDOM TOOTH EXTRACTION      There were no vitals filed for this visit.  Subjective Assessment - 12/30/17 1538    Currently in Pain?  Yes    Pain Score  4     Pain Location  Shoulder    Pain Orientation  Right    Pain Descriptors / Indicators  Aching    Pain Type  Acute pain    Pain Radiating Towards  N/A    Pain Onset  More than a month ago    Pain Frequency  Intermittent    Aggravating Factors   certain movements, household cleaners    Pain Relieving Factors  rest, pain medication, heat    Effect of Pain on Daily Activities  min-mod effect    Multiple Pain Sites  No         [facility] OT Assessment - 12/30/17 1507      Assessment   Medical Diagnosis  right shoulder pain      Precautions   Precautions  None               OT Treatments/Exercises (OP) - 12/30/17 1507      Exercises   Exercises  Shoulder;Neck      Shoulder  Exercises: Seated   Protraction  Strengthening;15 reps    Protraction Weight (lbs)  1    Horizontal ABduction  Strengthening;15 reps    Horizontal ABduction Weight (lbs)  1    External Rotation  Strengthening;15 reps    Internal Rotation  Strengthening;15 reps    Internal Rotation Weight (lbs)  1    Flexion  Strengthening;15 reps    Flexion Weight (lbs)  1    Abduction  Strengthening;15 reps    ABduction Weight (lbs)  1      Shoulder Exercises: Standing   External Rotation  Theraband;12 reps use towel roll      Shoulder Exercises: ROM/Strengthening   Over Head Lace  2'    "W" Arms  10X with 1#    X to V Arms  10X with 1#    Proximal Shoulder Strengthening, Supine  10X with 2# with no rest breaks    Proximal Shoulder Strengthening, Seated  15X  with 1# no rest break    Other ROM/Strengthening Exercises  Small green therapy ball used to complete shoulder flexion 10X and circle clockwise and counter clockwise 5X each way      Manual Therapy   Manual Therapy  Myofascial release    Manual therapy comments  Completed separately from therapeutic exercises     Myofascial Release  Myofascial release to cervical neck and trapezius regions to decrease pain and fascial restrictions and increase joint range of motion.              OT Education - 12/30/17 1538    Education provided  Yes    Education Details  external rotation with red band    Person(s) Educated  Patient    Methods  Explanation;Demonstration;Handout;Verbal cues    Comprehension  Verbalized understanding;Returned demonstration       OT Short Term Goals - 12/30/17 1545      OT SHORT TERM GOAL #1   Title  Pt will be educated on and independent in HEP to improve mobility required for functional reaching with RUE.     Time  4    Period  Weeks      OT SHORT TERM GOAL #2   Title  Pt will return to highest level of functioning using RUE as dominant during ADL completion.     Time  4    Period  Weeks      OT SHORT  TERM GOAL #3   Title  Pt will decrease fascial restrictions in RUE from max to minimal amounts or less to improve mobility required for overhead reaching.     Time  4    Period  Weeks      OT SHORT TERM GOAL #4   Title  Pt will decrease RUE and cervical neck pain to 3/10 or less to improve ability to sleep comfortably.     Time  4    Period  Weeks      OT SHORT TERM GOAL #5   Title  Pt will improve A/ROM of RUE to WNL to improve ability to complete bathing tasks using RUE as dominant.     Time  4    Period  Weeks      OT SHORT TERM GOAL #6   Title  Pt will improve RUE strength to 5/5 to increase ability to complete furniture refinishing tasks.     Time  4    Period  Weeks    Status  Partially Met      OT SHORT TERM GOAL #7   Title  Pt will improve cervical neck ROM to [facility] to improve ability to turn head when driving.     Time  4    Period  Weeks               Plan - 12/30/17 1543    Clinical Impression Statement  A: Session focused on shoulder strength and stability while progressing to 2# weights spine. Patient did require light guidance during supine abduction for proper form and to modify movement by bringing her shoulders slightly forward.     Plan  P: Attempt 10 ball circles clockwise and counter clockwise. Continue with working on strength and stability.     Consulted and Agree with Plan of Care  Patient       Patient will benefit from skilled therapeutic intervention in order to improve the following deficits and impairments:  Impaired flexibility, Decreased strength, Decreased activity tolerance, Pain,  Decreased range of motion, Increased fascial restrictions, Impaired UE functional use  Visit Diagnosis: Acute pain of right shoulder  Other symptoms and signs involving the musculoskeletal system  Cervicalgia    Problem List Patient Active Problem List   Diagnosis Date Noted  . History of HPV infection 11/15/2017  . HLD (hyperlipidemia) 05/12/2017  .  History of non anemic vitamin B12 deficiency 05/12/2017  . RBBB 05/12/2017  . Calcification of aorta (HCC) 04/04/2017  . Anxiety and depression 03/29/2017  . Chronic hepatitis B ([place]) 03/29/2017  . Lichen sclerosus of female genitalia 03/29/2017  . Osteoporosis 03/29/2017  . Chronic migraine 08/16/2016   Ailene Ravel, OTR/L,CBIS  [phone]  12/30/2017, 3:46 PM  [facility] [address] [place], [place], [address] Phone: [phone]   Fax:  [phone]  Name: Alyssa Durham MRN: [id number] Date of Birth: Sep 05, 1951

## 2018-01-03 ENCOUNTER — Encounter (HOSPITAL_COMMUNITY): Payer: Self-pay

## 2018-01-03 ENCOUNTER — Telehealth: Payer: Self-pay | Admitting: Neurology

## 2018-01-03 ENCOUNTER — Ambulatory Visit (HOSPITAL_COMMUNITY): Payer: PPO

## 2018-01-03 DIAGNOSIS — M542 Cervicalgia: Secondary | ICD-10-CM

## 2018-01-03 DIAGNOSIS — M25511 Pain in right shoulder: Secondary | ICD-10-CM

## 2018-01-03 DIAGNOSIS — R29898 Other symptoms and signs involving the musculoskeletal system: Secondary | ICD-10-CM

## 2018-01-03 NOTE — Patient Instructions (Signed)
EXTERNAL ROTATION STRETCH  Place arm along edge of door as pictured.  Keep elbow and shoulder at 90 degree angles.  Gently lean the upper torso forward until a stretch is felt in the shoulder. Hold _15-30__ seconds; relax; repeat     Wall Sleeper Stretch  Stand at wall with your elbow and shoulder touching the wall.  Maintain a 90deg bend in your elbow and your body shoulder be at a 45deg angle from the wall. Press down at your wrist to feel a stretch in your shoulder. Do not allow your shoulder to come off the wall. Hold for 15-30 seconds.

## 2018-01-03 NOTE — Telephone Encounter (Signed)
Pt left a message about scheduling her botox and she said she has been having a lot of headaches

## 2018-01-03 NOTE — Therapy (Signed)
[facility] [place], [place], [address] Phone: [phone]   Fax:  [phone]  Occupational Therapy Treatment  Patient Details  Name: Alyssa Durham MRN: [id number] Date of Birth: 1951/12/20 Referring Provider: Dr. Sanjuana Kava   Encounter Date: 01/03/2018  OT End of Session - 01/03/18 1502    Visit Number  15    Number of Visits  21    Date for OT Re-Evaluation  01/20/18    Authorization Type  [employer]    OT Start Time  1347    OT Stop Time  1430    OT Time Calculation (min)  43 min    Activity Tolerance  Patient tolerated treatment well    Behavior During Therapy  [facility] for tasks assessed/performed       Past Medical History:  Diagnosis Date  . Allergy   . Anxiety and depression   . Arthritis   . Asthma   . Cataract   . Chronic active type B viral hepatitis ([place])   . Depression   . HLD (hyperlipidemia) 05/12/2017  . Migraines   . Osteoporosis     Past Surgical History:  Procedure Laterality Date  . CESAREAN SECTION    . URETHRAL DILATION    . WISDOM TOOTH EXTRACTION      There were no vitals filed for this visit.  Subjective Assessment - 01/03/18 1411    Subjective   S: It still feels like usual.    Currently in Pain?  No/denies only with movement         [facility] OT Assessment - 01/03/18 1411      Assessment   Medical Diagnosis  right shoulder pain      Precautions   Precautions  None               OT Treatments/Exercises (OP) - 01/03/18 1423      Exercises   Exercises  Shoulder      Shoulder Exercises: Standing   External Rotation  Theraband;12 reps towel    Theraband Level (Shoulder External Rotation)  Level 2 (Red)      Shoulder Exercises: Therapy Ball   Right/Left  5 reps      Shoulder Exercises: Stretch   External Rotation Stretch  2 reps 15" using wall    Other Shoulder Stretches  Standing sleeper stretch with wall; 15" 2 times      Manual Therapy   Manual Therapy  Myofascial release    Manual therapy comments  Completed separately from therapeutic exercises     Myofascial Release  Myofascial release to cervical neck and trapezius regions to decrease pain and fascial restrictions and increase joint range of motion.                OT Short Term Goals - 12/30/17 1545      OT SHORT TERM GOAL #1   Title  Pt will be educated on and independent in HEP to improve mobility required for functional reaching with RUE.     Time  4    Period  Weeks      OT SHORT TERM GOAL #2   Title  Pt will return to highest level of functioning using RUE as dominant during ADL completion.     Time  4    Period  Weeks      OT SHORT TERM GOAL #3   Title  Pt will decrease fascial restrictions in RUE from max to minimal  amounts or less to improve mobility required for overhead reaching.     Time  4    Period  Weeks      OT SHORT TERM GOAL #4   Title  Pt will decrease RUE and cervical neck pain to 3/10 or less to improve ability to sleep comfortably.     Time  4    Period  Weeks      OT SHORT TERM GOAL #5   Title  Pt will improve A/ROM of RUE to WNL to improve ability to complete bathing tasks using RUE as dominant.     Time  4    Period  Weeks      OT SHORT TERM GOAL #6   Title  Pt will improve RUE strength to 5/5 to increase ability to complete furniture refinishing tasks.     Time  4    Period  Weeks    Status  Partially Met      OT SHORT TERM GOAL #7   Title  Pt will improve cervical neck ROM to [facility] to improve ability to turn head when driving.     Time  4    Period  Weeks               Plan - 01/03/18 1502    Clinical Impression Statement  A: Continued to work on shoulder strength and stability. Progressed to 2# supine with patient requiring a little more guidance for abduction (for form and technique). Added external and internal rotation stretch and therapy ball circles.     Plan  P: Continue with shoulder scapular  stability. Follow up on new shoulder stretches. Add small weighted ball circles around head and neck.    Consulted and Agree with Plan of Care  Patient       Patient will benefit from skilled therapeutic intervention in order to improve the following deficits and impairments:  Impaired flexibility, Decreased strength, Decreased activity tolerance, Pain, Decreased range of motion, Increased fascial restrictions, Impaired UE functional use  Visit Diagnosis: Acute pain of right shoulder  Other symptoms and signs involving the musculoskeletal system  Cervicalgia    Problem List Patient Active Problem List   Diagnosis Date Noted  . History of HPV infection 11/15/2017  . HLD (hyperlipidemia) 05/12/2017  . History of non anemic vitamin B12 deficiency 05/12/2017  . RBBB 05/12/2017  . Calcification of aorta (HCC) 04/04/2017  . Anxiety and depression 03/29/2017  . Chronic hepatitis B ([place]) 03/29/2017  . Lichen sclerosus of female genitalia 03/29/2017  . Osteoporosis 03/29/2017  . Chronic migraine 08/16/2016   Ailene Ravel, OTR/L,CBIS  [phone]  01/03/2018, 3:04 PM  [facility] [address] [place], [place], [address] Phone: [phone]   Fax:  [phone]  Name: Alyssa Durham MRN: [id number] Date of Birth: [birth year]

## 2018-01-04 ENCOUNTER — Telehealth: Payer: Self-pay | Admitting: Neurology

## 2018-01-04 NOTE — Telephone Encounter (Signed)
Is her botox approved and received?

## 2018-01-04 NOTE — Telephone Encounter (Signed)
Do you have a certain day to put her on?

## 2018-01-04 NOTE — Telephone Encounter (Signed)
Left message for patient to call me back. 

## 2018-01-04 NOTE — Telephone Encounter (Signed)
Pt returned your call and said [facility] approved the botox

## 2018-01-05 ENCOUNTER — Encounter: Payer: Self-pay | Admitting: Neurology

## 2018-01-05 ENCOUNTER — Telehealth (HOSPITAL_COMMUNITY): Payer: Self-pay

## 2018-01-05 ENCOUNTER — Ambulatory Visit (HOSPITAL_COMMUNITY): Payer: PPO

## 2018-01-05 NOTE — Telephone Encounter (Signed)
See notes

## 2018-01-05 NOTE — Telephone Encounter (Signed)
She has another migran and will not be here today

## 2018-01-10 ENCOUNTER — Encounter (HOSPITAL_COMMUNITY): Payer: Self-pay

## 2018-01-10 ENCOUNTER — Ambulatory Visit (HOSPITAL_COMMUNITY): Payer: PPO

## 2018-01-10 DIAGNOSIS — M25511 Pain in right shoulder: Secondary | ICD-10-CM

## 2018-01-10 DIAGNOSIS — M542 Cervicalgia: Secondary | ICD-10-CM

## 2018-01-10 DIAGNOSIS — R29898 Other symptoms and signs involving the musculoskeletal system: Secondary | ICD-10-CM

## 2018-01-10 NOTE — Therapy (Signed)
[place], [place], [address] Phone: [phone]   Fax:  [phone]  Occupational Therapy Treatment  Patient Details  Name: Alyssa Durham MRN: [id number] Date of Birth: 1951-09-25 Referring Provider: Dr. Sanjuana Kava   Encounter Date: 01/10/2018  OT End of Session - 01/10/18 1455    Visit Number  16    Number of Visits  21    Date for OT Re-Evaluation  01/20/18    Authorization Type  [employer]    OT Start Time  1350    OT Stop Time  1430    OT Time Calculation (min)  40 min    Activity Tolerance  Patient tolerated treatment well    Behavior During Therapy  [facility] for tasks assessed/performed       Past Medical History:  Diagnosis Date  . Allergy   . Anxiety and depression   . Arthritis   . Asthma   . Cataract   . Chronic active type B viral hepatitis ([place])   . Depression   . HLD (hyperlipidemia) 05/12/2017  . Migraines   . Osteoporosis     Past Surgical History:  Procedure Laterality Date  . CESAREAN SECTION    . URETHRAL DILATION    . WISDOM TOOTH EXTRACTION      There were no vitals filed for this visit.      [facility] OT Assessment - 01/10/18 1405      Assessment   Medical Diagnosis  right shoulder pain      Precautions   Precautions  None               OT Treatments/Exercises (OP) - 01/10/18 1405      Exercises   Exercises  Shoulder      Shoulder Exercises: Supine   Protraction  PROM;5 reps    Horizontal ABduction  PROM;5 reps    External Rotation  PROM;5 reps slightly abducted    Internal Rotation  PROM;5 reps slightly abducted    Flexion  PROM;5 reps    ABduction  PROM;5 reps      Shoulder Exercises: Standing   Protraction  Strengthening;10 reps    Protraction Weight (lbs)  1    Horizontal ABduction  Strengthening;10 reps    Horizontal ABduction Weight (lbs)  2    External Rotation  Strengthening;10 reps slightly abducted    External Rotation  Weight (lbs)  2    Internal Rotation  Strengthening;10 reps abducted slightly    Internal Rotation Weight (lbs)  2    Flexion  Strengthening;10 reps    Shoulder Flexion Weight (lbs)  2    ABduction  Strengthening;10 reps    Shoulder ABduction Weight (lbs)  2      Shoulder Exercises: ROM/Strengthening   UBE (Upper Arm Bike)  level 1 3 minutes reverse only    X to V Arms  10X with 2#    Proximal Shoulder Strengthening, Seated  12X with 2# no rest breaks    Ball on Wall  1' flexion 1' abduction green ball      Manual Therapy   Manual Therapy  Joint mobilization    Manual therapy comments  Completed separately from therapeutic exercises     Joint Mobilization  Joint mobilization and stretch completed to left upper arm to increase ROM and mobilize soft tissue and joint.             OT Education - 01/10/18 1418  Education provided  Yes    Education Details  Pt was told to stop stretches that were previously given. Patient was given print out of internal and external rotation motion with elbows slightly abducted.     Person(s) Educated  Patient    Methods  Explanation;Demonstration;Handout;Verbal cues    Comprehension  Verbalized understanding       OT Short Term Goals - 12/30/17 1545      OT SHORT TERM GOAL #1   Title  Pt will be educated on and independent in HEP to improve mobility required for functional reaching with RUE.     Time  4    Period  Weeks      OT SHORT TERM GOAL #2   Title  Pt will return to highest level of functioning using RUE as dominant during ADL completion.     Time  4    Period  Weeks      OT SHORT TERM GOAL #3   Title  Pt will decrease fascial restrictions in RUE from max to minimal amounts or less to improve mobility required for overhead reaching.     Time  4    Period  Weeks      OT SHORT TERM GOAL #4   Title  Pt will decrease RUE and cervical neck pain to 3/10 or less to improve ability to sleep comfortably.     Time  4    Period  Weeks       OT SHORT TERM GOAL #5   Title  Pt will improve A/ROM of RUE to WNL to improve ability to complete bathing tasks using RUE as dominant.     Time  4    Period  Weeks      OT SHORT TERM GOAL #6   Title  Pt will improve RUE strength to 5/5 to increase ability to complete furniture refinishing tasks.     Time  4    Period  Weeks    Status  Partially Met      OT SHORT TERM GOAL #7   Title  Pt will improve cervical neck ROM to [facility] to improve ability to turn head when driving.     Time  4    Period  Weeks               Plan - 01/10/18 1456    Clinical Impression Statement  A: Session focused on joint mobilization in the right UE to decrease pain level during functional use. Pt was given education to stop previously given stretches as her ROM is full and she is experiencing pain from the stretches. VC for form and technique.    Plan  P: Continue to focus on shoulder scapular stability. Add small weighted ball circles around head and neck.    Consulted and Agree with Plan of Care  Patient       Patient will benefit from skilled therapeutic intervention in order to improve the following deficits and impairments:     Visit Diagnosis: Acute pain of right shoulder  Other symptoms and signs involving the musculoskeletal system  Cervicalgia    Problem List Patient Active Problem List   Diagnosis Date Noted  . History of HPV infection 11/15/2017  . HLD (hyperlipidemia) 05/12/2017  . History of non anemic vitamin B12 deficiency 05/12/2017  . RBBB 05/12/2017  . Calcification of aorta (HCC) 04/04/2017  . Anxiety and depression 03/29/2017  . Chronic hepatitis B ([place]) 03/29/2017  . Lichen sclerosus  of female genitalia 03/29/2017  . Osteoporosis 03/29/2017  . Chronic migraine 08/16/2016   Ailene Ravel, OTR/L,CBIS  [phone]  01/10/2018, 3:02 PM  [facility] [address] [place], [place], [address] Phone: [phone]    Fax:  [phone]  Name: Alyssa Durham MRN: [id number] Date of Birth: Aug 06, 1951

## 2018-01-10 NOTE — Patient Instructions (Signed)
    With you elbow slightly away from your body, bring your hand towards your hip then lift it back as if you were reaching for the seat belt.   *Think superhero hands on the hips then reach back for the seat belt.

## 2018-01-12 ENCOUNTER — Ambulatory Visit (HOSPITAL_COMMUNITY): Payer: PPO

## 2018-01-12 ENCOUNTER — Telehealth (HOSPITAL_COMMUNITY): Payer: Self-pay | Admitting: Family Medicine

## 2018-01-12 NOTE — Telephone Encounter (Signed)
01/12/18  pt left a message to cx said something came up that was important

## 2018-01-17 ENCOUNTER — Ambulatory Visit (HOSPITAL_COMMUNITY): Payer: PPO | Admitting: Occupational Therapy

## 2018-01-17 ENCOUNTER — Encounter (HOSPITAL_COMMUNITY): Payer: Self-pay | Admitting: Occupational Therapy

## 2018-01-17 DIAGNOSIS — M25511 Pain in right shoulder: Secondary | ICD-10-CM

## 2018-01-17 DIAGNOSIS — R29898 Other symptoms and signs involving the musculoskeletal system: Secondary | ICD-10-CM

## 2018-01-17 NOTE — Therapy (Signed)
[place], [place], [address] Phone: [phone]   Fax:  [phone]  Occupational Therapy Treatment  Patient Details  Name: Alyssa Durham MRN: [id number] Date of Birth: [birth year] Referring Provider: Dr. Sanjuana Kava   Encounter Date: 01/17/2018  OT End of Session - 01/17/18 1434    Visit Number  17    Number of Visits  21    Date for OT Re-Evaluation  01/20/18    Authorization Type  [employer]    OT Start Time  1352    OT Stop Time  1430    OT Time Calculation (min)  38 min    Activity Tolerance  Patient tolerated treatment well    Behavior During Therapy  [facility] for tasks assessed/performed       Past Medical History:  Diagnosis Date  . Allergy   . Anxiety and depression   . Arthritis   . Asthma   . Cataract   . Chronic active type B viral hepatitis ([place])   . Depression   . HLD (hyperlipidemia) 05/12/2017  . Migraines   . Osteoporosis     Past Surgical History:  Procedure Laterality Date  . CESAREAN SECTION    . URETHRAL DILATION    . WISDOM TOOTH EXTRACTION      There were no vitals filed for this visit.  Subjective Assessment - 01/17/18 1354    Subjective   S: I didn't do any of my exercises because I've been hurting until today.     Currently in Pain?  No/denies only with movement         [facility] OT Assessment - 01/17/18 1354      Assessment   Medical Diagnosis  right shoulder pain      Precautions   Precautions  None               OT Treatments/Exercises (OP) - 01/17/18 1356      Exercises   Exercises  Shoulder      Shoulder Exercises: Supine   Protraction  PROM;5 reps    Horizontal ABduction  PROM;5 reps    External Rotation  PROM;5 reps    Internal Rotation  PROM;5 reps    Flexion  PROM;5 reps    ABduction  PROM;5 reps      Shoulder Exercises: Sidelying   External Rotation  Strengthening;12 reps    External Rotation Weight (lbs)  1    Internal  Rotation  Strengthening;12 reps    Internal Rotation Weight (lbs)  1    Flexion  Strengthening;12 reps    Flexion Weight (lbs)  1    ABduction  Strengthening;12 reps    ABduction Weight (lbs)  1    Other Sidelying Exercises  protraction, 12X, 1#    Other Sidelying Exercises  horizontal abduction, 12X, 1#      Shoulder Exercises: Standing   Protraction  Theraband;10 reps    Theraband Level (Shoulder Protraction)  Level 2 (Red)    Horizontal ABduction  Theraband;10 reps    Theraband Level (Shoulder Horizontal ABduction)  Level 2 (Red)    External Rotation  Theraband;10 reps    Theraband Level (Shoulder External Rotation)  Level 2 (Red)    Internal Rotation  Theraband;10 reps    Theraband Level (Shoulder Internal Rotation)  Level 2 (Red)    Flexion  Theraband;10 reps    Theraband Level (Shoulder Flexion)  Level 2 (Red)      Shoulder Exercises:  ROM/Strengthening   Wall Pushups  10 reps    Ball on Wall  1' flexion 1' abduction green ball    Other ROM/Strengthening Exercises  small green weighted ball circles around head, 5X each direction      Manual Therapy   Manual Therapy  Myofascial release    Manual therapy comments  Completed separately from therapeutic exercises     Myofascial Release  Myofascial release to cervical neck and trapezius regions to decrease pain and fascial restrictions and increase joint range of motion.                OT Short Term Goals - 12/30/17 1545      OT SHORT TERM GOAL #1   Title  Pt will be educated on and independent in HEP to improve mobility required for functional reaching with RUE.     Time  4    Period  Weeks      OT SHORT TERM GOAL #2   Title  Pt will return to highest level of functioning using RUE as dominant during ADL completion.     Time  4    Period  Weeks      OT SHORT TERM GOAL #3   Title  Pt will decrease fascial restrictions in RUE from max to minimal amounts or less to improve mobility required for overhead reaching.      Time  4    Period  Weeks      OT SHORT TERM GOAL #4   Title  Pt will decrease RUE and cervical neck pain to 3/10 or less to improve ability to sleep comfortably.     Time  4    Period  Weeks      OT SHORT TERM GOAL #5   Title  Pt will improve A/ROM of RUE to WNL to improve ability to complete bathing tasks using RUE as dominant.     Time  4    Period  Weeks      OT SHORT TERM GOAL #6   Title  Pt will improve RUE strength to 5/5 to increase ability to complete furniture refinishing tasks.     Time  4    Period  Weeks    Status  Partially Met      OT SHORT TERM GOAL #7   Title  Pt will improve cervical neck ROM to [facility] to improve ability to turn head when driving.     Time  4    Period  Weeks               Plan - 01/17/18 1434    Clinical Impression Statement  A: Pt reports neck pain after last session. Session focusing on scapular strengthening and stability, added standing red theraband strengthening, weighted ball circles at head height, and wall push-ups. Pt reports moderate fatigue at end of theraband exercises. Educated pt on using heat or TENS unit at home for relaxation and minimizing soreness. Verbal cuing during session for form and technique.     Plan  P: Reassessment and discharge    Consulted and Agree with Plan of Care  Patient       Patient will benefit from skilled therapeutic intervention in order to improve the following deficits and impairments:  Impaired flexibility, Decreased strength, Decreased activity tolerance, Pain, Decreased range of motion, Increased fascial restrictions, Impaired UE functional use  Visit Diagnosis: Acute pain of right shoulder  Other symptoms and signs involving the musculoskeletal system  Problem List Patient Active Problem List   Diagnosis Date Noted  . History of HPV infection 11/15/2017  . HLD (hyperlipidemia) 05/12/2017  . History of non anemic vitamin B12 deficiency 05/12/2017  . RBBB 05/12/2017  .  Calcification of aorta (HCC) 04/04/2017  . Anxiety and depression 03/29/2017  . Chronic hepatitis B ([place]) 03/29/2017  . Lichen sclerosus of female genitalia 03/29/2017  . Osteoporosis 03/29/2017  . Chronic migraine 08/16/2016   Guadelupe Sabin, OTR/L  [phone] 01/17/2018, 2:44 PM  [facility] [address] [place], [place], [address] Phone: [phone]   Fax:  [phone]  Name: Alyssa Durham MRN: [id number] Date of Birth: 29-May-1951

## 2018-01-19 ENCOUNTER — Telehealth (HOSPITAL_COMMUNITY): Payer: Self-pay | Admitting: Family Medicine

## 2018-01-19 ENCOUNTER — Ambulatory Visit (HOSPITAL_COMMUNITY): Payer: PPO

## 2018-01-19 NOTE — Telephone Encounter (Signed)
01/19/18  pt left a message to cx because she has a migraine

## 2018-01-20 ENCOUNTER — Ambulatory Visit (INDEPENDENT_AMBULATORY_CARE_PROVIDER_SITE_OTHER): Payer: PPO | Admitting: Neurology

## 2018-01-20 DIAGNOSIS — G43011 Migraine without aura, intractable, with status migrainosus: Secondary | ICD-10-CM

## 2018-01-20 MED ORDER — ONABOTULINUMTOXINA 100 UNITS IJ SOLR
100.0000 [IU] | Freq: Once | INTRAMUSCULAR | Status: AC
  Administered 2018-01-20: 100 [IU] via INTRAMUSCULAR

## 2018-01-20 NOTE — Procedures (Signed)
[facility]   Procedure Note Botox  Attending: Dr. Narda Amber  Preoperative Diagnosis(es): Chronic migraine  Consent obtained from: The patient Benefits discussed included, but were not limited to decreased muscle tightness, increased joint range of motion, and decreased pain.  Risk discussed included, but were not limited pain and discomfort, bleeding, bruising, excessive weakness, venous thrombosis, muscle atrophy and dysphagia.  Anticipated outcomes of the procedure as well as he risks and benefits of the alternatives to the procedure, and the roles and tasks of the personnel to be involved, were discussed with the patient, and the patient consents to the procedure and agrees to proceed. A copy of the patient medication guide was given to the patient which explains the blackbox warning.  Patients identity and treatment sites confirmed Yes.    Details of Procedure: Skin was cleaned with alcohol. Prior to injection, the needle plunger was aspirated to make sure the needle was not within a blood vessel.  There was no blood retrieved on aspiration.    Following is a summary of the muscles injected  And the amount of Botulinum toxin used:   Agent and Dilution 200 units of botulinum Type A (Onobotulinum Toxin type A) was reconstituted with 4 ml of preservative free normal saline.  Time of reconstitution: At the time of the office visit (<30 minutes prior to injection)   Injections   145 total units of Botox was injected with a 30 gauge needle.  Injection Sites: L occipitalis: 15 units- 3 sites  R occiptalis: 15 units- 3 sites  L upper trapezius: 15 units- 3 sites R upper trapezius: 15 units- 3 sits          L paraspinal (___mid__level): 10 units- 2 sites R paraspinal (__mid___level): 10 units- 2 sites   Face L frontalis(2 injection sites):10 units   R frontalis(2 injection sites):10 units         L corrugator: 5 units   R corrugator: 5 units           Procerus: 5  units   L temporalis: 15 units R temporalis: 15 units    Total injected (Units): 145  Total wasted (Units): 55   Patient tolerated procedure well without complications.   Reinjection is anticipated in 3 months.

## 2018-01-24 ENCOUNTER — Ambulatory Visit: Payer: PPO | Admitting: Orthopaedic Surgery

## 2018-02-08 ENCOUNTER — Ambulatory Visit: Payer: PPO | Admitting: Neurology

## 2018-02-08 ENCOUNTER — Encounter (INDEPENDENT_AMBULATORY_CARE_PROVIDER_SITE_OTHER): Payer: PPO | Admitting: Internal Medicine

## 2018-02-08 ENCOUNTER — Other Ambulatory Visit: Payer: Self-pay

## 2018-02-08 ENCOUNTER — Encounter: Payer: Self-pay | Admitting: Neurology

## 2018-02-08 VITALS — BP 134/86 | HR 84 | Ht 62.0 in | Wt 175.0 lb

## 2018-02-08 DIAGNOSIS — G43011 Migraine without aura, intractable, with status migrainosus: Secondary | ICD-10-CM

## 2018-02-08 DIAGNOSIS — G43709 Chronic migraine without aura, not intractable, without status migrainosus: Secondary | ICD-10-CM

## 2018-02-08 DIAGNOSIS — IMO0002 Reserved for concepts with insufficient information to code with codable children: Secondary | ICD-10-CM

## 2018-02-08 MED ORDER — PREDNISONE 20 MG PO TABS: ORAL_TABLET | ORAL | 0 refills | Status: DC

## 2018-02-08 MED ORDER — ERENUMAB-AOOE 70 MG/ML ~~LOC~~ SOAJ: 1.0000 [IU] | SUBCUTANEOUS | 11 refills | Status: DC

## 2018-02-08 MED ORDER — RELPAX 40 MG PO TABS: ORAL_TABLET | ORAL | 11 refills | Status: DC

## 2018-02-08 NOTE — Patient Instructions (Signed)
1. Take Prednisone 20mg : 3 tabs on day 1, 2-1/2 tabs on day 2, 2 tabs on day 3, 1-1/2 tabs on day 4, 1 tab on day 5, 1/2 tab on days 6 and 7, then stop. #11 tabs no refills  2. It is very important to cut down on the Relpax use, otherwise whatever we do will not help with the headaches  3. We will try to get Aimovig approved through your insurance  4. Continue Botox every 3 months  5. Follow-up in 4 months, call for any changes

## 2018-02-08 NOTE — Progress Notes (Signed)
NEUROLOGY FOLLOW UP OFFICE NOTE  Alyssa Durham [id number]  DOB: [birth year]  HISTORY OF PRESENT ILLNESS: I had the pleasure of seeing Alyssa Durham in follow-up in the neurology clinic on 02/08/2018.  The patient was last seen 3 months ago for chronic migraines and memory loss. She received Botox 3 weeks ago with Dr. Posey Pronto. She is unhappy today reporting an increase in migraines. In the past she would have a good response after Botox, but after last session she has not noticed any improvement. She has had 6 migraines in the past 7 days, and has been taking Relpax 2-3 times a day. She reports that she was switched to generic eletriptan and has noticed that when in the past she would take 1/2 tablet of Relpax, then another 1/2 tablet with improvement in migraine, now she has to take 1 tablet of eletriptan, then 1 or 2 more later in the day. It makes her drowsy. She was so medicated yesterday that she slept and today feels a little better. Majority of her migraines are upon awakening. She wonders about sleep apnea, she does not know if she snores, but states she feels refreshed in the morning and does not have daytime drowsiness unless she takes the Relpax. She gets 8-9 hours of sleep. She is very sensitive to medications and has been hesitant to try another preventative medication. She denies any diplopia, focal numbness/tingling/weakness. She fell 2 weeks ago on her porch but did not hit her head. She hurt her left hip and elbow.   HPI 08/16/2016: This is a 67 yo RH woman with a history of chronic migraines, depression. She moved from [place] 2 months ago, records from her previous neurologist were reviewed. She has had migraines since 1969. Migraines now are mostly localized over the right hemisphere, if she closes her eyes, she sees flashing lights, but no prior aura. This is associated with nausea, vomiting, photo and phonophobia. She has 4-5 migraine days a week, taking 1/2 to 2 tablets of Relpax at the  onset, which does help ease progression of symptoms. For the past 4 years, she has had migraines upon awakening. Migraines were fairly well-controlled until 1993 when she was started on a trial of an unrecalled antidepressant which helped. In December 2016, headaches worsened and she was started on Lexapro, which helped with headaches but caused weight gain and hair loss. She tried Celexa (weight gain), Zoloft (weight gain), Wellbutrin. She also tried Topamax but stopped due to risk for kidney stones. She recalls taking Inderal but cannot recall effects/side effects. Most recently, she was tried on nortriptyline, which did not help with the headaches, but helped her mood. She is now on Cymbalta for arthritis, which also helps with her mood. For rescue, she has tried sumatriptan, Maxalt, and Zomig, with no effect. Relpax and Frova helped the most, causing less palpitations. She has noticed stress to be a big trigger, as well a chocolates. She usually sleeps good. There is a family history of migraines in her father, brother, and niece. She had received 2 rounds of Botox injections for chronic migraine, the first one helped very well with much less use of Relpax. Last Botox was in April 2017 before her move.   She is also reporting memory loss. She had told her neurologist about this in 1994. Memory issues are more for long-term memory, she cannot recall what happened years ago with her family, which concerns her because she cannot recall her children growing up. Short-term memory is  pretty good, she denies getting lost driving, no missed medications or bill payments. She denies any dizziness, diplopia, dysarthria, dysphagia, neck pain, bowel/bladder dysfunction. She has floaters in her left eye.   Diagnostic Data: MRI brain without contrast done 04/17/15 at [facility] in [place] did not show any acute changes, there was subtle white matter hyperintensity in the posterior left occipital lobe.  Compared to 2007 study, this is only minimally progressive. EEG done 04/18/15 in [place] was within normal limits.  Prior Preventative Medications: Zoloft, Topamax, Inderal, nortriptyline, Cymbalta, Botox, Zonisamide Prior Rescue medications: Relpax, sumatriptan, maxalt, Zomig, Frova, Amerge (did help) Has not tried: gabapentin, depakote   PAST MEDICAL HISTORY: Past Medical History:  Diagnosis Date  . Allergy   . Anxiety and depression   . Arthritis   . Asthma   . Cataract   . Chronic active type B viral hepatitis ([place])   . Depression   . HLD (hyperlipidemia) 05/12/2017  . Migraines   . Osteoporosis     MEDICATIONS: Current Outpatient Medications on File Prior to Visit  Medication Sig Dispense Refill  . albuterol (PROVENTIL HFA;VENTOLIN HFA) 108 (90 Base) MCG/ACT inhaler Inhale 2 puffs into the lungs every 6 (six) hours as needed for wheezing or shortness of breath. 18 g 0  . BOTOX 200 units SOLR Inject 200 Units as directed every 3 (three) months.    . Clobetasol Prop Emollient Base 0.05 % emollient cream Apply topically 2 (two) times daily.    Marland Kitchen FLUoxetine (PROZAC) 10 MG tablet Take 1 tablet (10 mg total) by mouth daily. 90 tablet 3  . naproxen (NAPROSYN) 500 MG tablet Take 1 tablet (500 mg total) by mouth 2 (two) times daily with a meal. 60 tablet 5  . RELPAX 40 MG tablet TAKE 1 TABLET BY MOUTH AT ONSET OF MIGRAINE. DO NOT TAKE MORE THAN 3 A WEEK 12 tablet 11  . traMADol (ULTRAM) 50 MG tablet Take 1 tablet (50 mg total) by mouth every 6 (six) hours as needed. 60 tablet 1  . zonisamide (ZONEGRAN) 100 MG capsule Take 100 mg by mouth at bedtime. (Patient not taking)  6   No current facility-administered medications on file prior to visit.     ALLERGIES: Allergies  Allergen Reactions  . Codeine Nausea And Vomiting    FAMILY HISTORY: Family History  Problem Relation Age of Onset  . Uterine cancer Mother   . Cancer Mother        Breast cancer  . Migraines Father   .  Early death Father 19       suicide   . Transient ischemic attack Father        multiple   . Migraines Brother   . Drug abuse Son        addict - stable on suboxone  . Hepatitis B Son   . Cancer Maternal Grandmother        breast  . Alcohol abuse Paternal Grandfather   . Early death Paternal Aunt        suicide  . Mental illness Paternal Aunt     SOCIAL HISTORY: Social History   Socioeconomic History  . Marital status: Divorced    Spouse name: Not on file  . Number of children: 3  . Years of education: 76  . Highest education level: Not on file  Social Needs  . Financial resource strain: Not on file  . Food insecurity - worry: Not on file  . Food insecurity - inability:  Not on file  . Transportation needs - medical: Not on file  . Transportation needs - non-medical: Not on file  Occupational History  . Occupation: retired    Comment: Chiropractor  Tobacco Use  . Smoking status: Former Smoker    Packs/day: 2.00    Years: 5.00    Pack years: 10.00    Types: Cigarettes    Start date: 12/21/1967    Last attempt to quit: 12/20/1972    Years since quitting: 45.1  . Smokeless tobacco: Never Used  Substance and Sexual Activity  . Alcohol use: No  . Drug use: No  . Sexual activity: Not Currently  Other Topics Concern  . Not on file  Social History Narrative   Retired Web designer   Three children  [place] [employer] - 2    [place]: Constitutional: No fevers, chills, or sweats, no generalized fatigue, change in appetite Eyes: No visual changes, double vision, eye pain Ear, nose and throat: No hearing loss, ear pain, nasal congestion, sore throat Cardiovascular: No chest pain, palpitations Respiratory:  No shortness of breath at rest or with exertion, wheezes GastrointestinaI: No nausea, vomiting, diarrhea, abdominal pain, fecal incontinence Genitourinary:  No dysuria, urinary retention or frequency Musculoskeletal:   No neck pain, back pain Integumentary: No rash, pruritus, skin lesions Neurological: as above Psychiatric: No depression, insomnia, anxiety Endocrine: No palpitations, fatigue, diaphoresis, mood swings, change in appetite, change in weight, increased thirst Hematologic/Lymphatic:  No anemia, purpura, petechiae. Allergic/Immunologic: no itchy/runny eyes, nasal congestion, recent allergic reactions, rashes  PHYSICAL EXAM: Vitals:   02/08/18 1537  BP: 134/86  Pulse: 84  SpO2: 97%   General: No acute distress Head:  Normocephalic/atraumatic Neck: supple, no paraspinal tenderness, full range of motion Heart:  Regular rate and rhythm Lungs:  Clear to auscultation bilaterally Back: No paraspinal tenderness Skin/Extremities: No rash, no edema Neurological Exam: alert and oriented to person, place, and time. No aphasia or dysarthria. Fund of knowledge is appropriate.  Recent and remote memory are intact.  Attention and concentration are normal.    Able to name objects and repeat phrases. Cranial nerves: Pupils equal, round, reactive to light. Extraocular movements intact with no nystagmus. Visual fields full. Facial sensation intact. No facial asymmetry. Tongue, uvula, palate midline.  Motor: Bulk and tone normal, muscle strength 5/5 throughout with no pronator drift.  Sensation to light touch intact.  No extinction to double simultaneous stimulation.  Deep tendon reflexes brisk  2+ throughout, toes downgoing.  Finger to nose testing intact.  Gait narrow-based and steady, able to tandem walk adequately.  Romberg negative.  IMPRESSION: This is a 67 yo RH woman with chronic migraines. She is reporting an increase in migraines, and has been taking Relpax (eletriptan) 2-3 times a day. I discussed with her that migraines are likely increased to due medication overuse of triptan. She has not noticed much improvement with last Botox, but was advised to continue next session. She is hesitant to start another  preventative medication, but with increase in migraines, at this point would recommend another medication. She is very sensitive to medications, and may tolerate Aimovig better, which has a much shorter list of side effects. She was advised to reduce intake of Relpax to 2-3 times a week, and was given a prednisone taper today to hopefully break this current headache cycle. She will follow-up in 3-4 months.   Thank you for allowing me to participate in her care.  Please do  not hesitate to call for any questions or concerns.  The duration of this appointment visit was 25 minutes of face-to-face time with the patient.  Greater than 50% of this time was spent in counseling, explanation of diagnosis, planning of further management, and coordination of care.   Ellouise Newer, M.D.  CC: Dr. Blanchie Serve

## 2018-02-09 ENCOUNTER — Ambulatory Visit (INDEPENDENT_AMBULATORY_CARE_PROVIDER_SITE_OTHER): Payer: PPO | Admitting: Orthopaedic Surgery

## 2018-02-09 ENCOUNTER — Encounter: Payer: Self-pay | Admitting: Orthopaedic Surgery

## 2018-02-09 VITALS — BP 141/86 | HR 105 | Ht 62.0 in | Wt 176.0 lb

## 2018-02-09 DIAGNOSIS — M25511 Pain in right shoulder: Secondary | ICD-10-CM | POA: Diagnosis not present

## 2018-02-09 NOTE — Progress Notes (Signed)
Patient Alyssa Durham, female DOB:[birth year], 67 y.o. QAS:[id number]  Chief Complaint  Patient presents with  . Shoulder Pain    right     HPI  Alyssa Durham is a 67 y.o. female who has continued pain of the right shoulder.  She has been to OT for over 15 visits.  She has pain of the right shoulder with extension and with abduction of the right shoulder.  She made some soup today and had pain using a scoop to get it out of the pot.  She is tired of the shoulder hurting.  She has no paresthesias or redness.  I will get a MRI of the shoulder as I am concerned about rotator cuff now. HPI  Body mass index is 32.19 kg/m.  ROS  Review of Systems  HENT: Negative for congestion.   Respiratory: Positive for shortness of breath. Negative for cough.   Cardiovascular: Negative for chest pain and leg swelling.  Endocrine: Negative for cold intolerance.  Musculoskeletal: Positive for arthralgias and neck pain.  Allergic/Immunologic: Positive for environmental allergies.  Neurological: Positive for headaches.  Psychiatric/Behavioral: The patient is nervous/anxious.   All other systems reviewed and are negative.   Past Medical History:  Diagnosis Date  . Allergy   . Anxiety and depression   . Arthritis   . Asthma   . Cataract   . Chronic active type B viral hepatitis ([place])   . Depression   . HLD (hyperlipidemia) 05/12/2017  . Migraines   . Osteoporosis     Past Surgical History:  Procedure Laterality Date  . CESAREAN SECTION    . URETHRAL DILATION    . WISDOM TOOTH EXTRACTION      Family History  Problem Relation Age of Onset  . Uterine cancer Mother   . Cancer Mother        Breast cancer  . Migraines Father   . Early death Father 59       suicide   . Transient ischemic attack Father        multiple   . Migraines Brother   . Drug abuse Son        addict - stable on suboxone  . Hepatitis B Son   . Cancer Maternal Grandmother        breast  . Alcohol abuse  Paternal Grandfather   . Early death Paternal Aunt        suicide  . Mental illness Paternal Aunt     Social History Social History   Tobacco Use  . Smoking status: Former Smoker    Packs/day: 2.00    Years: 5.00    Pack years: 10.00    Types: Cigarettes    Start date: 12/21/1967    Last attempt to quit: 12/20/1972    Years since quitting: 45.1  . Smokeless tobacco: Never Used  Substance Use Topics  . Alcohol use: No  . Drug use: No    Allergies  Allergen Reactions  . Codeine Nausea And Vomiting    Current Outpatient Medications  Medication Sig Dispense Refill  . BOTOX 200 units SOLR Inject 200 Units as directed every 3 (three) months.    . Clobetasol Prop Emollient Base 0.05 % emollient cream Apply topically 2 (two) times daily.    Eduard Roux (AIMOVIG) 70 MG/ML SOAJ Inject 1 Units into the skin every 30 (thirty) days. 1 pen 11  . naproxen (NAPROSYN) 500 MG tablet Take 1 tablet (500 mg total) by mouth 2 (two) times  daily with a meal. 60 tablet 5  . PROAIR HFA 108 (90 Base) MCG/ACT inhaler INHALE 2 PUFFS INTO THE LUNGS EVERY 6 HOURS AS NEEDED FOR WHEEZING OR SHORTNESS OF BREATH 18 g 1  . RELPAX 40 MG tablet TAKE 1 TABLET BY MOUTH AT ONSET OF MIGRAINE. DO NOT TAKE MORE THAN 3 A WEEK 12 tablet 11  . traMADol (ULTRAM) 50 MG tablet Take 1 tablet (50 mg total) by mouth every 6 (six) hours as needed. 60 tablet 1  . predniSONE (DELTASONE) 20 MG tablet Prednisone 20mg : Take 3 tabs on day 1, 2-1/2 tabs on day 2, 2 tabs on day 3, 1-1/2 tabs on day 4, 1 tab on day 5, 1/2 tab on days 6 and 7, then stop. (Patient not taking: Reported on 02/09/2018) 11 tablet 0   No current facility-administered medications for this visit.      Physical Exam  Blood pressure (!) 141/86, pulse (!) 105, height 5\' 2"  (1.575 m), weight 176 lb (79.8 kg).  Constitutional: overall normal hygiene, normal nutrition, well developed, normal grooming, normal body habitus. Assistive  device:none  Musculoskeletal: gait and station Limp none, muscle tone and strength are normal, no tremors or atrophy is present.  .  Neurological: coordination overall normal.  Deep tendon reflex/nerve stretch intact.  Sensation normal.  Cranial nerves II-XII intact.   Skin:   Normal overall no scars, lesions, ulcers or rashes. No psoriasis.  Psychiatric: Alert and oriented x 3.  Recent memory intact, remote memory unclear.  Normal mood and affect. Well groomed.  Good eye contact.  Cardiovascular: overall no swelling, no varicosities, no edema bilaterally, normal temperatures of the legs and arms, no clubbing, cyanosis and good capillary refill.  Lymphatic: palpation is normal.  Examination of right Upper Extremity is done.  Inspection:   Overall:  Elbow non-tender without crepitus or defects, forearm non-tender without crepitus or defects, wrist non-tender without crepitus or defects, hand non-tender.    Shoulder: with glenohumeral joint tenderness, without effusion.   Upper arm: without swelling and tenderness   Range of motion:   Overall:  Full range of motion of the elbow, full range of motion of wrist and full range of motion in fingers.   Shoulder:  right  150 degrees forward flexion; 135 degrees abduction; 30 degrees internal rotation, 30 degrees external rotation, 10 degrees extension, 40 degrees adduction.   Stability:   Overall:  Shoulder, elbow and wrist stable   Strength and Tone:   Overall full shoulder muscles strength, full upper arm strength and normal upper arm bulk and tone.  All other systems reviewed and are negative   The patient has been educated about the nature of the problem(s) and counseled on treatment options.  The patient appeared to understand what I have discussed and is in agreement with it.  Encounter Diagnosis  Name Primary?  . Pain in joint of right shoulder Yes    PLAN Call if any problems.  Precautions discussed.  Continue current  medications.   Return to clinic MRI of the right shoulder   Electronically Signed Sanjuana Kava, MD 2/21/20193:32 PM

## 2018-02-19 ENCOUNTER — Inpatient Hospital Stay: Admission: RE | Admit: 2018-02-19 | Payer: PPO | Source: Ambulatory Visit

## 2018-02-20 ENCOUNTER — Telehealth: Payer: Self-pay | Admitting: Neurology

## 2018-02-20 ENCOUNTER — Encounter: Payer: Self-pay | Admitting: Neurology

## 2018-02-20 NOTE — Telephone Encounter (Signed)
Patient sent mychart message. Replied and let her know she could make a nurse appt for this.

## 2018-02-20 NOTE — Telephone Encounter (Signed)
Patient called wanting to see if she could come by the office one day to have some one show her how to do the Aimovig Injection? Please Call. Thanks

## 2018-02-22 ENCOUNTER — Ambulatory Visit: Payer: PPO

## 2018-02-27 ENCOUNTER — Encounter: Payer: Self-pay | Admitting: Family Medicine

## 2018-02-28 ENCOUNTER — Other Ambulatory Visit: Payer: PPO

## 2018-03-02 ENCOUNTER — Telehealth: Payer: Self-pay | Admitting: Orthopedic Surgery

## 2018-03-02 NOTE — Telephone Encounter (Signed)
Patient called, relays had cancelled her MRI at [facility] due to a migraine. Has left a detailed voice message, asking if still possible to have it scheduled at [facility]. Aware she will need to cancel her follow up visit for tomorrow, Friday, 03/03/18. Please advise.

## 2018-03-03 ENCOUNTER — Encounter: Payer: Self-pay | Admitting: Orthopaedic Surgery

## 2018-03-03 ENCOUNTER — Ambulatory Visit: Payer: PPO | Admitting: Orthopedic Surgery

## 2018-03-03 NOTE — Telephone Encounter (Signed)
She needed MRI at [facility] due to claustrophobia, they have open scan  Alyssa Durham does not have open scan, she will be in scanner head first.    If she is okay with this, I can make appointment for her at [facility] her to discuss left message for her to call me back

## 2018-03-03 NOTE — Telephone Encounter (Signed)
I called her again she has had time to think about it and she does need the open scanner, she will call [facility] to reschedule

## 2018-03-16 ENCOUNTER — Ambulatory Visit
Admission: RE | Admit: 2018-03-16 | Discharge: 2018-03-16 | Disposition: A | Discharge status: Home / Self Care | Payer: Self-pay | Source: Ambulatory Visit | Attending: Orthopaedic Surgery | Admitting: Orthopaedic Surgery

## 2018-03-16 DIAGNOSIS — M25511 Pain in right shoulder: Secondary | ICD-10-CM

## 2018-03-21 ENCOUNTER — Ambulatory Visit: Payer: Self-pay | Admitting: Orthopedic Surgery

## 2018-03-30 ENCOUNTER — Ambulatory Visit (INDEPENDENT_AMBULATORY_CARE_PROVIDER_SITE_OTHER): Payer: Self-pay | Admitting: Orthopedic Surgery

## 2018-03-30 VITALS — BP 138/90 | HR 93 | Ht 62.0 in | Wt 176.0 lb

## 2018-03-30 DIAGNOSIS — M25511 Pain in right shoulder: Secondary | ICD-10-CM

## 2018-03-30 DIAGNOSIS — M19011 Primary osteoarthritis, right shoulder: Secondary | ICD-10-CM

## 2018-03-30 DIAGNOSIS — M75111 Incomplete rotator cuff tear or rupture of right shoulder, not specified as traumatic: Secondary | ICD-10-CM

## 2018-03-30 NOTE — Progress Notes (Signed)
FOLLOW UP VISIT : MRI RESULTS   Chief Complaint  Patient presents with  . Follow-up    Recheck on right shoulder, review MRI.     HPI: The patient is here TO DISCUSS THE RESULTS OF MRI  67 year old female presents back for discussion of MRI results.  In review patient had a shot in her right shoulder some type of vaccination started having pain developed adhesive capsulitis severe shoulder pain and eventually had physical therapy which improved her overall range of motion although it did not return to normal  But the patient became severely upset during our discussion because she says since that injection she her life has changed she is not able to do the things that she wants to do still having shoulder pain.   BP 138/90   Pulse 93   Ht 5\' 2"  (1.575 m)   Wt 176 lb (79.8 kg)   BMI 32.19 kg/m    Medical decision-making section   DATA  MRI REPORT:   IMPRESSION: 1. Rotator cuff tendinopathy/tendinosis with a bursal surface tear involving the supraspinatus tendon anteriorly. No full-thickness retracted tear. 2. Degenerated and likely torn superior labrum. 3. Intact long head biceps tendon. 4. Moderate lateral downsloping of the acromion and mild AC joint degenerative changes may contribute to bony impingement. 5. Mild to moderate subacromial/subdeltoid bursitis.     Electronically Signed   By: Marijo Sanes M.D.   On: 03/17/2018 08:06   My independent reading: OF THE MRI does show some acromioclavicular arthritis a lot of synovitis which may explain the effects of the injection she also has a bursal side tear almost full-thickness but not full-thickness  I discussed this with her for over 20 minutes as she became very emotional and cried a couple of times during the visit I do not think I can make her shoulder like it was before the injection even if we repaired the cuff debrided it did an acromioplasty and distal clavicle excision.  Perhaps the shoulder could be better  than it is now but no guarantees  She will decide if she wants to have the surgery  If she is going to have the surgery we should do a preop history and physical as I have never actually examined her shoulder   Encounter Diagnoses  Name Primary?  . Pain in joint of right shoulder Yes  . Arthrosis of right acromioclavicular joint   . Nontraumatic incomplete tear of right rotator cuff       The visit primarily was face-to-face counseling regarding the MRI.  This included more than 50% of the time which was 20 minutes.  I actually took the report and line by line went through it with her and gave her a copy

## 2018-04-25 ENCOUNTER — Ambulatory Visit: Payer: PPO | Admitting: Neurology

## 2018-04-27 ENCOUNTER — Ambulatory Visit (INDEPENDENT_AMBULATORY_CARE_PROVIDER_SITE_OTHER): Payer: PPO | Admitting: Neurology

## 2018-04-27 DIAGNOSIS — G43011 Migraine without aura, intractable, with status migrainosus: Secondary | ICD-10-CM

## 2018-04-27 MED ORDER — ONABOTULINUMTOXINA 100 UNITS IJ SOLR
135.0000 [IU] | Freq: Once | INTRAMUSCULAR | Status: AC
  Administered 2018-04-27: 135 [IU] via INTRAMUSCULAR

## 2018-04-27 MED ORDER — ONABOTULINUMTOXINA 100 UNITS IJ SOLR
200.0000 [IU] | Freq: Once | INTRAMUSCULAR | Status: AC
  Administered 2018-04-27: 200 [IU] via INTRAMUSCULAR

## 2018-04-27 NOTE — Procedures (Signed)
[facility]   Procedure Note Botox  Attending: Dr. Narda Amber  Preoperative Diagnosis(es): Chronic migraine  Consent obtained from: The patient Benefits discussed included, but were not limited to decreased muscle tightness, increased joint range of motion, and decreased pain.  Risk discussed included, but were not limited pain and discomfort, bleeding, bruising, excessive weakness, venous thrombosis, muscle atrophy and dysphagia.  Anticipated outcomes of the procedure as well as he risks and benefits of the alternatives to the procedure, and the roles and tasks of the personnel to be involved, were discussed with the patient, and the patient consents to the procedure and agrees to proceed. A copy of the patient medication guide was given to the patient which explains the blackbox warning.  Patients identity and treatment sites confirmed Yes.    Details of Procedure: Skin was cleaned with alcohol. Prior to injection, the needle plunger was aspirated to make sure the needle was not within a blood vessel.  There was no blood retrieved on aspiration.    Following is a summary of the muscles injected  And the amount of Botulinum toxin used:   Agent and Dilution 200 units of botulinum Type A (Onobotulinum Toxin type A) was reconstituted with 4 ml of preservative free normal saline.  Time of reconstitution: At the time of the office visit (<30 minutes prior to injection)   Injections   135 total units of Botox was injected with a 30 gauge needle.  Injection Sites: L occipitalis: 15 units- 3 sites  R occiptalis: 15 units- 3 sites  L upper trapezius: 15 units- 3 sites R upper trapezius: 15 units- 3 sits          L paraspinal (___mid__level): 10 units- 2 sites R paraspinal (__mid___level): 10 units- 2 sites   Face L frontalis(2 injection sites):10 units   R frontalis(2 injection sites):10 units         L corrugator: 5 units   R corrugator: 5 units           Procerus: 5  units   L temporalis: 10 units R temporalis: 10 units    Total injected (Units): 135  Total wasted (Units): 65   Patient tolerated procedure well without complications.   Reinjection is anticipated in 4 months.

## 2018-04-28 ENCOUNTER — Encounter: Payer: Self-pay | Admitting: Orthopaedic Surgery

## 2018-04-28 DIAGNOSIS — M25511 Pain in right shoulder: Secondary | ICD-10-CM | POA: Diagnosis not present

## 2018-04-28 DIAGNOSIS — L900 Lichen sclerosus et atrophicus: Secondary | ICD-10-CM | POA: Diagnosis not present

## 2018-04-28 DIAGNOSIS — G43009 Migraine without aura, not intractable, without status migrainosus: Secondary | ICD-10-CM | POA: Diagnosis not present

## 2018-05-04 DIAGNOSIS — L820 Inflamed seborrheic keratosis: Secondary | ICD-10-CM | POA: Diagnosis not present

## 2018-05-10 DIAGNOSIS — M25511 Pain in right shoulder: Secondary | ICD-10-CM | POA: Diagnosis not present

## 2018-05-10 DIAGNOSIS — Z0000 Encounter for general adult medical examination without abnormal findings: Secondary | ICD-10-CM | POA: Diagnosis not present

## 2018-05-16 ENCOUNTER — Encounter: Payer: PPO | Admitting: Family Medicine

## 2018-05-17 DIAGNOSIS — F419 Anxiety disorder, unspecified: Secondary | ICD-10-CM | POA: Diagnosis not present

## 2018-05-17 DIAGNOSIS — G43009 Migraine without aura, not intractable, without status migrainosus: Secondary | ICD-10-CM | POA: Diagnosis not present

## 2018-05-17 DIAGNOSIS — M25511 Pain in right shoulder: Secondary | ICD-10-CM | POA: Diagnosis not present

## 2018-05-17 DIAGNOSIS — L900 Lichen sclerosus et atrophicus: Secondary | ICD-10-CM | POA: Diagnosis not present

## 2018-05-17 DIAGNOSIS — D72819 Decreased white blood cell count, unspecified: Secondary | ICD-10-CM | POA: Diagnosis not present

## 2018-05-17 DIAGNOSIS — E782 Mixed hyperlipidemia: Secondary | ICD-10-CM | POA: Diagnosis not present

## 2018-05-17 DIAGNOSIS — Z6832 Body mass index (BMI) 32.0-32.9, adult: Secondary | ICD-10-CM | POA: Diagnosis not present

## 2018-05-24 DIAGNOSIS — D72819 Decreased white blood cell count, unspecified: Secondary | ICD-10-CM | POA: Diagnosis not present

## 2018-05-24 DIAGNOSIS — L900 Lichen sclerosus et atrophicus: Secondary | ICD-10-CM | POA: Diagnosis not present

## 2018-05-24 DIAGNOSIS — F419 Anxiety disorder, unspecified: Secondary | ICD-10-CM | POA: Diagnosis not present

## 2018-05-24 DIAGNOSIS — R300 Dysuria: Secondary | ICD-10-CM | POA: Diagnosis not present

## 2018-05-24 DIAGNOSIS — E782 Mixed hyperlipidemia: Secondary | ICD-10-CM | POA: Diagnosis not present

## 2018-05-24 DIAGNOSIS — Z6832 Body mass index (BMI) 32.0-32.9, adult: Secondary | ICD-10-CM | POA: Diagnosis not present

## 2018-05-24 DIAGNOSIS — M25511 Pain in right shoulder: Secondary | ICD-10-CM | POA: Diagnosis not present

## 2018-05-24 DIAGNOSIS — G43009 Migraine without aura, not intractable, without status migrainosus: Secondary | ICD-10-CM | POA: Diagnosis not present

## 2018-05-29 ENCOUNTER — Other Ambulatory Visit: Payer: Self-pay | Admitting: Neurology

## 2018-05-29 DIAGNOSIS — IMO0002 Reserved for concepts with insufficient information to code with codable children: Secondary | ICD-10-CM

## 2018-05-29 DIAGNOSIS — G43709 Chronic migraine without aura, not intractable, without status migrainosus: Secondary | ICD-10-CM

## 2018-06-01 ENCOUNTER — Ambulatory Visit: Payer: PPO | Admitting: Neurology

## 2018-06-02 ENCOUNTER — Telehealth (HOSPITAL_COMMUNITY): Payer: Self-pay | Admitting: Occupational Therapy

## 2018-06-02 NOTE — Telephone Encounter (Signed)
SENT MEDICAL RECORD REQUEST TO HIM [facility] NUMBER [id number]. NF

## 2018-06-07 DIAGNOSIS — F419 Anxiety disorder, unspecified: Secondary | ICD-10-CM | POA: Diagnosis not present

## 2018-06-07 DIAGNOSIS — R300 Dysuria: Secondary | ICD-10-CM | POA: Diagnosis not present

## 2018-06-07 DIAGNOSIS — G43009 Migraine without aura, not intractable, without status migrainosus: Secondary | ICD-10-CM | POA: Diagnosis not present

## 2018-06-07 DIAGNOSIS — M25511 Pain in right shoulder: Secondary | ICD-10-CM | POA: Diagnosis not present

## 2018-06-07 DIAGNOSIS — D72819 Decreased white blood cell count, unspecified: Secondary | ICD-10-CM | POA: Diagnosis not present

## 2018-06-07 DIAGNOSIS — Z6832 Body mass index (BMI) 32.0-32.9, adult: Secondary | ICD-10-CM | POA: Diagnosis not present

## 2018-06-07 DIAGNOSIS — L900 Lichen sclerosus et atrophicus: Secondary | ICD-10-CM | POA: Diagnosis not present

## 2018-06-07 DIAGNOSIS — E782 Mixed hyperlipidemia: Secondary | ICD-10-CM | POA: Diagnosis not present

## 2018-06-15 DIAGNOSIS — L820 Inflamed seborrheic keratosis: Secondary | ICD-10-CM | POA: Diagnosis not present

## 2018-06-15 DIAGNOSIS — L821 Other seborrheic keratosis: Secondary | ICD-10-CM | POA: Diagnosis not present

## 2018-06-29 DIAGNOSIS — M7541 Impingement syndrome of right shoulder: Secondary | ICD-10-CM | POA: Diagnosis not present

## 2018-06-29 DIAGNOSIS — M25511 Pain in right shoulder: Secondary | ICD-10-CM | POA: Diagnosis not present

## 2018-07-27 DIAGNOSIS — M25511 Pain in right shoulder: Secondary | ICD-10-CM | POA: Diagnosis not present

## 2018-08-01 ENCOUNTER — Ambulatory Visit: Payer: PPO | Admitting: Neurology

## 2018-08-01 DIAGNOSIS — G43011 Migraine without aura, intractable, with status migrainosus: Secondary | ICD-10-CM

## 2018-08-01 MED ORDER — ONABOTULINUMTOXINA 100 UNITS IJ SOLR
200.0000 [IU] | Freq: Once | INTRAMUSCULAR | Status: AC
  Administered 2018-08-01: 200 [IU] via INTRAMUSCULAR

## 2018-08-01 MED ORDER — ONABOTULINUMTOXINA 100 UNITS IJ SOLR: 135.0000 [IU] | Freq: Once | INTRAMUSCULAR | Status: DC

## 2018-08-01 NOTE — Procedures (Signed)
[facility]   Procedure Note Botox  Attending: Dr. Narda Amber  Preoperative Diagnosis(es): Chronic migraine  Consent obtained from: The patient Benefits discussed included, but were not limited to decreased muscle tightness, increased joint range of motion, and decreased pain.  Risk discussed included, but were not limited pain and discomfort, bleeding, bruising, excessive weakness, venous thrombosis, muscle atrophy and dysphagia.  Anticipated outcomes of the procedure as well as he risks and benefits of the alternatives to the procedure, and the roles and tasks of the personnel to be involved, were discussed with the patient, and the patient consents to the procedure and agrees to proceed. A copy of the patient medication guide was given to the patient which explains the blackbox warning.  Patients identity and treatment sites confirmed Yes.    Details of Procedure: Skin was cleaned with alcohol. Prior to injection, the needle plunger was aspirated to make sure the needle was not within a blood vessel.  There was no blood retrieved on aspiration.    Following is a summary of the muscles injected  And the amount of Botulinum toxin used:   Agent and Dilution 200 units of botulinum Type A (Onobotulinum Toxin type A) was reconstituted with 4 ml of preservative free normal saline.  Time of reconstitution: At the time of the office visit (<30 minutes prior to injection)   Injections   135 total units of Botox was injected with a 30 gauge needle.  Injection Sites: L occipitalis: 15 units- 3 sites  R occiptalis: 15 units- 3 sites  L upper trapezius: 15 units- 3 sites R upper trapezius: 15 units- 3 sits          L paraspinal (___mid__level): 10 units- 2 sites R paraspinal (__mid___level): 10 units- 2 sites   Face L frontalis(2 injection sites):10 units   R frontalis(2 injection sites):10 units         L corrugator: 5 units   R corrugator: 5 units           Procerus: 5  units   L temporalis: 10 units R temporalis: 10 units    Total injected (Units): 135  Total wasted (Units): 65   Patient tolerated procedure well without complications.   No plans for reinjection at this time.

## 2018-08-30 ENCOUNTER — Other Ambulatory Visit: Payer: Self-pay | Admitting: Neurology

## 2018-08-30 DIAGNOSIS — G43709 Chronic migraine without aura, not intractable, without status migrainosus: Secondary | ICD-10-CM

## 2018-08-30 DIAGNOSIS — IMO0002 Reserved for concepts with insufficient information to code with codable children: Secondary | ICD-10-CM

## 2018-09-12 ENCOUNTER — Other Ambulatory Visit: Payer: Self-pay

## 2018-09-12 ENCOUNTER — Ambulatory Visit: Payer: PPO | Admitting: Neurology

## 2018-09-12 ENCOUNTER — Encounter: Payer: Self-pay | Admitting: Neurology

## 2018-09-12 VITALS — BP 118/72 | HR 84 | Ht 62.0 in | Wt 182.0 lb

## 2018-09-12 DIAGNOSIS — G43709 Chronic migraine without aura, not intractable, without status migrainosus: Secondary | ICD-10-CM | POA: Diagnosis not present

## 2018-09-12 DIAGNOSIS — IMO0002 Reserved for concepts with insufficient information to code with codable children: Secondary | ICD-10-CM

## 2018-09-12 MED ORDER — NARATRIPTAN HCL 2.5 MG PO TABS: ORAL_TABLET | ORAL | 11 refills | Status: DC

## 2018-09-12 MED ORDER — ERENUMAB-AOOE 140 MG/ML ~~LOC~~ SOAJ: 140.0000 mg | SUBCUTANEOUS | 11 refills | Status: DC

## 2018-09-12 NOTE — Progress Notes (Signed)
NEUROLOGY FOLLOW UP OFFICE NOTE  Alyssa Durham [id number]  DOB: [birth year]  HISTORY OF PRESENT ILLNESS: I had the pleasure of seeing Alyssa Durham in follow-up in the neurology clinic on 09/12/2018.  The patient was last seen 7 months ago for chronic migraines and memory loss. She has continued to receive Botox with Dr. Posey Pronto, last session was a month ago. On her last visit, she was started on Aimovig and reports a significant improvement in migraines from almost daily migraines to one, maybe two a week. She reports it felt like migraines were worse the first month, "then like magic," on the second month she was waking up like a normal person migraine-free, able to do things in the morning. She also noted better response to Relpax. However, over the past 2 months, she has had an increase to 2-3 a week. She had a migraine this morning and took 1/2 tablet Relpax. She would like to try a different rescue medication, Frova helped in the past. She tried leftover generic sumatriptan which did not help. She has a little constipation with Aimovig. She recently started Prozac and feels it is causing weight gain. She denies any dizziness, vision changes, focal numbness/tingling/weakness, no falls.   HPI 08/16/2016: This is a 67 yo RH woman with a history of chronic migraines, depression. She moved from [place] 2 months ago, records from her previous neurologist were reviewed. She has had migraines since 1969. Migraines now are mostly localized over the right hemisphere, if she closes her eyes, she sees flashing lights, but no prior aura. This is associated with nausea, vomiting, photo and phonophobia. She has 4-5 migraine days a week, taking 1/2 to 2 tablets of Relpax at the onset, which does help ease progression of symptoms. For the past 4 years, she has had migraines upon awakening. Migraines were fairly well-controlled until 1993 when she was started on a trial of an unrecalled antidepressant which helped. In  December 2016, headaches worsened and she was started on Lexapro, which helped with headaches but caused weight gain and hair loss. She tried Celexa (weight gain), Zoloft (weight gain), Wellbutrin. She also tried Topamax but stopped due to risk for kidney stones. She recalls taking Inderal but cannot recall effects/side effects. Most recently, she was tried on nortriptyline, which did not help with the headaches, but helped her mood. She is now on Cymbalta for arthritis, which also helps with her mood. For rescue, she has tried sumatriptan, Maxalt, and Zomig, with no effect. Relpax and Frova helped the most, causing less palpitations. She has noticed stress to be a big trigger, as well a chocolates. She usually sleeps good. There is a family history of migraines in her father, brother, and niece. She had received 2 rounds of Botox injections for chronic migraine, the first one helped very well with much less use of Relpax. Last Botox was in April 2017 before her move.   She is also reporting memory loss. She had told her neurologist about this in 1994. Memory issues are more for long-term memory, she cannot recall what happened years ago with her family, which concerns her because she cannot recall her children growing up. Short-term memory is pretty good, she denies getting lost driving, no missed medications or bill payments. She denies any dizziness, diplopia, dysarthria, dysphagia, neck pain, bowel/bladder dysfunction. She has floaters in her left eye.   Diagnostic Data: MRI brain without contrast done 04/17/15 at [facility] in [place] did not show any acute changes,  there was subtle white matter hyperintensity in the posterior left occipital lobe. Compared to 2007 study, this is only minimally progressive. EEG done 04/18/15 in [place] was within normal limits.  Prior Preventative Medications: Zoloft, Topamax, Inderal, nortriptyline, Cymbalta, Botox, Zonisamide Prior Rescue medications:  Relpax, sumatriptan, maxalt, Zomig, Frova, Amerge (did help) Has not tried: gabapentin, depakote   PAST MEDICAL HISTORY: Past Medical History:  Diagnosis Date  . Allergy   . Anxiety and depression   . Arthritis   . Asthma   . Cataract   . Chronic active type B viral hepatitis ([place])   . Depression   . HLD (hyperlipidemia) 05/12/2017  . Migraines   . Osteoporosis     MEDICATIONS:  Outpatient Encounter Medications as of 09/12/2018  Medication Sig  . BOTOX 200 units SOLR Inject 200 Units as directed every 3 (three) months.  . Clobetasol Prop Emollient Base 0.05 % emollient cream Apply topically 2 (two) times daily.  Eduard Roux (AIMOVIG) 70 MG/ML SOAJ Inject 1 Units into the skin every 30 (thirty) days.  Marland Kitchen FLUoxetine (PROZAC) 10 MG tablet Take 10 mg by mouth daily.  Marland Kitchen PROAIR HFA 108 (90 Base) MCG/ACT inhaler INHALE 2 PUFFS INTO THE LUNGS EVERY 6 HOURS AS NEEDED FOR WHEEZING OR SHORTNESS OF BREATH  . RELPAX 40 MG tablet TAKE 1 TABLET BY MOUTH AT ONSET OF MIGRAINE. DO NOT TAKE MORE THAN 3 A WEEK  . [DISCONTINUED] naproxen (NAPROSYN) 500 MG tablet Take 1 tablet (500 mg total) by mouth 2 (two) times daily with a meal.  . [DISCONTINUED] predniSONE (DELTASONE) 20 MG tablet Prednisone 20mg : Take 3 tabs on day 1, 2-1/2 tabs on day 2, 2 tabs on day 3, 1-1/2 tabs on day 4, 1 tab on day 5, 1/2 tab on days 6 and 7, then stop. (Patient not taking: Reported on 02/09/2018)  . [DISCONTINUED] RELPAX 40 MG tablet TAKE 1 TABLET BY MOUTH AT ONSET OF MIGRAINE. DO NOT TAKE MORE THAN 3 EACH WEEK  . [DISCONTINUED] traMADol (ULTRAM) 50 MG tablet Take 1 tablet (50 mg total) by mouth every 6 (six) hours as needed.   No facility-administered encounter medications on file as of 09/12/2018.     ALLERGIES: Allergies  Allergen Reactions  . Codeine Nausea And Vomiting    FAMILY HISTORY: Family History  Problem Relation Age of Onset  . Uterine cancer Mother   . Cancer Mother        Breast cancer  .  Migraines Father   . Early death Father 26       suicide   . Transient ischemic attack Father        multiple   . Migraines Brother   . Drug abuse Son        addict - stable on suboxone  . Hepatitis B Son   . Cancer Maternal Grandmother        breast  . Alcohol abuse Paternal Grandfather   . Early death Paternal Aunt        suicide  . Mental illness Paternal Aunt     SOCIAL HISTORY: Social History   Socioeconomic History  . Marital status: Divorced    Spouse name: Not on file  . Number of children: 3  . Years of education: 67  . Highest education level: Not on file  Occupational History  . Occupation: retired    Comment: Chiropractor  Social Needs  . Financial resource strain: Not on file  . Food insecurity:    Worry:  Not on file    Inability: Not on file  . Transportation needs:    Medical: Not on file    Non-medical: Not on file  Tobacco Use  . Smoking status: Former Smoker    Packs/day: 2.00    Years: 5.00    Pack years: 10.00    Types: Cigarettes    Start date: 12/21/1967    Last attempt to quit: 12/20/1972    Years since quitting: 45.7  . Smokeless tobacco: Never Used  Substance and Sexual Activity  . Alcohol use: No  . Drug use: No  . Sexual activity: Not Currently  Lifestyle  . Physical activity:    Days per week: Not on file    Minutes per session: Not on file  . Stress: Not on file  Relationships  . Social connections:    Talks on phone: Not on file    Gets together: Not on file    Attends religious service: Not on file    Active member of club or organization: Not on file    Attends meetings of clubs or organizations: Not on file    Relationship status: Not on file  . Intimate partner violence:    Fear of current or ex partner: Not on file    Emotionally abused: Not on file    Physically abused: Not on file    Forced sexual activity: Not on file  Other Topics Concern  . Not on file  Social History Narrative   Retired Manufacturing systems engineer   Three children  [place] and [employer] - 2    [place]: Constitutional: No fevers, chills, or sweats, no generalized fatigue, change in appetite Eyes: No visual changes, double vision, eye pain Ear, nose and throat: No hearing loss, ear pain, nasal congestion, sore throat Cardiovascular: No chest pain, palpitations Respiratory:  No shortness of breath at rest or with exertion, wheezes GastrointestinaI: No nausea, vomiting, diarrhea, abdominal pain, fecal incontinence Genitourinary:  No dysuria, urinary retention or frequency Musculoskeletal:  No neck pain, back pain Integumentary: No rash, pruritus, skin lesions Neurological: as above Psychiatric: No depression, insomnia, anxiety Endocrine: No palpitations, fatigue, diaphoresis, mood swings, change in appetite, change in weight, increased thirst Hematologic/Lymphatic:  No anemia, purpura, petechiae. Allergic/Immunologic: no itchy/runny eyes, nasal congestion, recent allergic reactions, rashes  PHYSICAL EXAM: Vitals:   09/12/18 1509  BP: 118/72  Pulse: 84  SpO2: 97%   General: No acute distress Head:  Normocephalic/atraumatic Neck: supple, no paraspinal tenderness, full range of motion Heart:  Regular rate and rhythm Lungs:  Clear to auscultation bilaterally Back: No paraspinal tenderness Skin/Extremities: No rash, no edema Neurological Exam: alert and oriented to person, place, and time. No aphasia or dysarthria. Fund of knowledge is appropriate.  Recent and remote memory are intact.  Attention and concentration are normal.    Able to name objects and repeat phrases. Cranial nerves: Pupils equal, round, reactive to light. Extraocular movements intact with no nystagmus. Visual fields full. Facial sensation intact. No facial asymmetry. Tongue, uvula, palate midline.  Motor: Bulk and tone normal, muscle strength 5/5 throughout with no pronator drift.  Sensation to light touch intact.  No  extinction to double simultaneous stimulation.  Deep tendon reflexes brisk  2+ throughout, toes downgoing.  Finger to nose testing intact.  Gait narrow-based and steady, able to tandem walk adequately.  Romberg negative.  IMPRESSION: This is a 67 yo RH woman with chronic migraines. She had an excellent initial response  to Elrama but recently started back to 2-3 migraines a week. We will try the higher dose of Aimovig. She has tried several rescue medications in the past and felt Amerge helped better, a prescription will be sent today. Continue other medications as prescribed. She will follow-up in 6 months and knows to call for any changes.  Thank you for allowing me to participate in her care.  Please do not hesitate to call for any questions or concerns.  The duration of this appointment visit was 25 minutes of face-to-face time with the patient.  Greater than 50% of this time was spent in counseling, explanation of diagnosis, planning of further management, and coordination of care.   Ellouise Newer, M.D.  CC: Dr. Nevada Crane

## 2018-09-12 NOTE — Patient Instructions (Signed)
1. Let's increase the Aimovig dose to 140mg /mL. With your next month's dose, please use the new prescription  2. A prescription for Amerge 2.5mg  as needed has been sent to your pharmacy. Do not take more than 3 times a week, otherwise headaches may worsen  3. Follow-up in 6 months or so, call for any changes

## 2018-09-18 DIAGNOSIS — Z6833 Body mass index (BMI) 33.0-33.9, adult: Secondary | ICD-10-CM | POA: Diagnosis not present

## 2018-09-18 DIAGNOSIS — G43009 Migraine without aura, not intractable, without status migrainosus: Secondary | ICD-10-CM | POA: Diagnosis not present

## 2018-09-18 DIAGNOSIS — F419 Anxiety disorder, unspecified: Secondary | ICD-10-CM | POA: Diagnosis not present

## 2018-09-18 DIAGNOSIS — Z0000 Encounter for general adult medical examination without abnormal findings: Secondary | ICD-10-CM | POA: Diagnosis not present

## 2018-09-20 ENCOUNTER — Encounter (HOSPITAL_COMMUNITY): Payer: Self-pay | Admitting: Occupational Therapy

## 2018-09-20 NOTE — Therapy (Signed)
[facility] [address], [place], [address] Phone: [phone]   Fax:  [phone]  Patient Details  Name: Alyssa Durham MRN: [id number] Date of Birth: 05/17/1951 Referring Provider:  No ref. provider found  Encounter Date: 09/20/2018  OCCUPATIONAL THERAPY DISCHARGE SUMMARY  Visits from Start of Care: 17  Current functional level related to goals / functional outcomes: Unknown. Pt did not return to therapy after last visit on 01/17/18. Per chart review pt had MRI which show bursa surface tear.    Remaining deficits: unknown   Education / Equipment: HEP for ROM, strengthening Plan: Patient agrees to discharge.  Patient goals were partially met. Patient is being discharged due to not returning since the last visit.  ?????       Guadelupe Sabin, OTR/L  [phone] 09/20/2018, 2:48 PM  [facility] [address] [place], [place], [address] Phone: [phone]   Fax:  [phone]

## 2018-10-03 ENCOUNTER — Telehealth (INDEPENDENT_AMBULATORY_CARE_PROVIDER_SITE_OTHER): Payer: Self-pay | Admitting: Internal Medicine

## 2018-10-03 DIAGNOSIS — L299 Pruritus, unspecified: Secondary | ICD-10-CM | POA: Diagnosis not present

## 2018-10-03 DIAGNOSIS — F419 Anxiety disorder, unspecified: Secondary | ICD-10-CM | POA: Diagnosis not present

## 2018-10-03 DIAGNOSIS — G43009 Migraine without aura, not intractable, without status migrainosus: Secondary | ICD-10-CM | POA: Diagnosis not present

## 2018-10-03 DIAGNOSIS — Z6833 Body mass index (BMI) 33.0-33.9, adult: Secondary | ICD-10-CM | POA: Diagnosis not present

## 2018-10-03 DIAGNOSIS — F339 Major depressive disorder, recurrent, unspecified: Secondary | ICD-10-CM | POA: Diagnosis not present

## 2018-10-03 NOTE — Telephone Encounter (Signed)
Patient called stated she needs to have lab work done  -  Please call her at [phone]

## 2018-10-04 ENCOUNTER — Telehealth (INDEPENDENT_AMBULATORY_CARE_PROVIDER_SITE_OTHER): Payer: Self-pay | Admitting: Internal Medicine

## 2018-10-04 ENCOUNTER — Other Ambulatory Visit (HOSPITAL_COMMUNITY): Payer: Self-pay | Admitting: Internal Medicine

## 2018-10-04 DIAGNOSIS — Z1231 Encounter for screening mammogram for malignant neoplasm of breast: Secondary | ICD-10-CM

## 2018-10-04 DIAGNOSIS — B181 Chronic viral hepatitis B without delta-agent: Secondary | ICD-10-CM

## 2018-10-04 NOTE — Telephone Encounter (Signed)
Message left on answering machine. AFP, Hepatic, Hep B quaint, and [place] RUQ ordered.   MItzi, OV in 3 months

## 2018-10-04 NOTE — Telephone Encounter (Signed)
Labs for Hep B ordered

## 2018-10-04 NOTE — Telephone Encounter (Signed)
[place] sch'd 10/09/18 at 830 (815), npo after midnight, patient aware

## 2018-10-04 NOTE — Telephone Encounter (Signed)
Alyssa Durham, [place] RUQ. Hepatitis B

## 2018-10-09 ENCOUNTER — Ambulatory Visit (HOSPITAL_COMMUNITY): Payer: PPO

## 2018-10-09 ENCOUNTER — Ambulatory Visit (HOSPITAL_COMMUNITY)
Admission: RE | Admit: 2018-10-09 | Discharge: 2018-10-09 | Disposition: A | Discharge status: Home / Self Care | Payer: PPO | Source: Ambulatory Visit | Attending: Internal Medicine | Admitting: Internal Medicine

## 2018-10-09 DIAGNOSIS — B181 Chronic viral hepatitis B without delta-agent: Secondary | ICD-10-CM | POA: Insufficient documentation

## 2018-10-09 DIAGNOSIS — B1910 Unspecified viral hepatitis B without hepatic coma: Secondary | ICD-10-CM | POA: Diagnosis not present

## 2018-10-10 ENCOUNTER — Telehealth (INDEPENDENT_AMBULATORY_CARE_PROVIDER_SITE_OTHER): Payer: Self-pay | Admitting: Internal Medicine

## 2018-10-10 DIAGNOSIS — K769 Liver disease, unspecified: Secondary | ICD-10-CM

## 2018-10-10 DIAGNOSIS — B1910 Unspecified viral hepatitis B without hepatic coma: Secondary | ICD-10-CM

## 2018-10-10 DIAGNOSIS — R935 Abnormal findings on diagnostic imaging of other abdominal regions, including retroperitoneum: Secondary | ICD-10-CM

## 2018-10-10 DIAGNOSIS — B181 Chronic viral hepatitis B without delta-agent: Secondary | ICD-10-CM

## 2018-10-10 NOTE — Telephone Encounter (Signed)
[facility] will contact pt directly to schedule

## 2018-10-10 NOTE — Addendum Note (Signed)
Addended by: Butch Penny on: 10/10/2018 02:12 PM   Modules accepted: Orders

## 2018-10-10 NOTE — Telephone Encounter (Signed)
Alyssa Durham, MRI liver ibuprofen have not talked with patient yet. Am waiting on her to return my call

## 2018-10-10 NOTE — Telephone Encounter (Signed)
AFP ordered

## 2018-10-11 ENCOUNTER — Ambulatory Visit (HOSPITAL_COMMUNITY)
Admission: RE | Admit: 2018-10-11 | Discharge: 2018-10-11 | Disposition: A | Discharge status: Home / Self Care | Payer: PPO | Source: Ambulatory Visit | Attending: Internal Medicine | Admitting: Internal Medicine

## 2018-10-11 DIAGNOSIS — Z1231 Encounter for screening mammogram for malignant neoplasm of breast: Secondary | ICD-10-CM | POA: Insufficient documentation

## 2018-10-11 DIAGNOSIS — B181 Chronic viral hepatitis B without delta-agent: Secondary | ICD-10-CM | POA: Diagnosis not present

## 2018-10-12 ENCOUNTER — Other Ambulatory Visit (HOSPITAL_COMMUNITY): Payer: Self-pay | Admitting: Internal Medicine

## 2018-10-12 DIAGNOSIS — R928 Other abnormal and inconclusive findings on diagnostic imaging of breast: Secondary | ICD-10-CM

## 2018-10-13 LAB — HEPATIC FUNCTION PANEL
AG RATIO: 1.6 (calc) (ref 1.0–2.5)
ALKALINE PHOSPHATASE (APISO): 68 U/L (ref 33–130)
ALT: 22 U/L (ref 6–29)
AST: 27 U/L (ref 10–35)
Albumin: 4.1 g/dL (ref 3.6–5.1)
Bilirubin, Direct: 0.1 mg/dL (ref 0.0–0.2)
Globulin: 2.6 g/dL (calc) (ref 1.9–3.7)
Indirect Bilirubin: 0.4 mg/dL (calc) (ref 0.2–1.2)
TOTAL PROTEIN: 6.7 g/dL (ref 6.1–8.1)
Total Bilirubin: 0.5 mg/dL (ref 0.2–1.2)

## 2018-10-13 LAB — HEPATITIS B DNA, ULTRAQUANTITATIVE, PCR
Hepatitis B DNA (Calc): 2.34 Log IU/mL — ABNORMAL HIGH
Hepatitis B DNA: 219 IU/mL — ABNORMAL HIGH

## 2018-10-13 LAB — AFP TUMOR MARKER: AFP TUMOR MARKER: 5.1 ng/mL

## 2018-10-17 ENCOUNTER — Ambulatory Visit (HOSPITAL_COMMUNITY)
Admission: RE | Admit: 2018-10-17 | Discharge: 2018-10-17 | Disposition: A | Discharge status: Home / Self Care | Payer: PPO | Source: Ambulatory Visit | Attending: Internal Medicine | Admitting: Internal Medicine

## 2018-10-17 ENCOUNTER — Ambulatory Visit (HOSPITAL_COMMUNITY): Payer: PPO

## 2018-10-17 DIAGNOSIS — H25043 Posterior subcapsular polar age-related cataract, bilateral: Secondary | ICD-10-CM | POA: Diagnosis not present

## 2018-10-17 DIAGNOSIS — H18413 Arcus senilis, bilateral: Secondary | ICD-10-CM | POA: Diagnosis not present

## 2018-10-17 DIAGNOSIS — H25013 Cortical age-related cataract, bilateral: Secondary | ICD-10-CM | POA: Diagnosis not present

## 2018-10-17 DIAGNOSIS — H2513 Age-related nuclear cataract, bilateral: Secondary | ICD-10-CM | POA: Diagnosis not present

## 2018-10-17 DIAGNOSIS — R928 Other abnormal and inconclusive findings on diagnostic imaging of breast: Secondary | ICD-10-CM | POA: Diagnosis not present

## 2018-10-17 DIAGNOSIS — H02831 Dermatochalasis of right upper eyelid: Secondary | ICD-10-CM | POA: Diagnosis not present

## 2018-10-17 DIAGNOSIS — H2511 Age-related nuclear cataract, right eye: Secondary | ICD-10-CM | POA: Diagnosis not present

## 2018-10-20 DIAGNOSIS — F419 Anxiety disorder, unspecified: Secondary | ICD-10-CM | POA: Diagnosis not present

## 2018-10-20 DIAGNOSIS — Z23 Encounter for immunization: Secondary | ICD-10-CM | POA: Diagnosis not present

## 2018-10-21 ENCOUNTER — Ambulatory Visit
Admission: RE | Admit: 2018-10-21 | Discharge: 2018-10-21 | Disposition: A | Discharge status: Home / Self Care | Payer: PPO | Source: Ambulatory Visit | Attending: Internal Medicine | Admitting: Internal Medicine

## 2018-10-21 DIAGNOSIS — K769 Liver disease, unspecified: Secondary | ICD-10-CM

## 2018-10-21 DIAGNOSIS — B181 Chronic viral hepatitis B without delta-agent: Secondary | ICD-10-CM

## 2018-10-21 DIAGNOSIS — R935 Abnormal findings on diagnostic imaging of other abdominal regions, including retroperitoneum: Secondary | ICD-10-CM

## 2018-10-21 MED ORDER — GADOBENATE DIMEGLUMINE 529 MG/ML IV SOLN
17.0000 mL | Freq: Once | INTRAVENOUS | Status: AC | PRN
  Administered 2018-10-21: 17 mL via INTRAVENOUS

## 2018-10-24 DIAGNOSIS — M19041 Primary osteoarthritis, right hand: Secondary | ICD-10-CM | POA: Diagnosis not present

## 2018-10-25 ENCOUNTER — Encounter (INDEPENDENT_AMBULATORY_CARE_PROVIDER_SITE_OTHER): Payer: Self-pay

## 2018-10-26 ENCOUNTER — Encounter (INDEPENDENT_AMBULATORY_CARE_PROVIDER_SITE_OTHER): Payer: Self-pay

## 2018-10-30 ENCOUNTER — Other Ambulatory Visit (INDEPENDENT_AMBULATORY_CARE_PROVIDER_SITE_OTHER): Payer: Self-pay | Admitting: *Deleted

## 2018-10-30 DIAGNOSIS — K769 Liver disease, unspecified: Secondary | ICD-10-CM

## 2018-11-01 ENCOUNTER — Other Ambulatory Visit (INDEPENDENT_AMBULATORY_CARE_PROVIDER_SITE_OTHER): Payer: Self-pay | Admitting: *Deleted

## 2018-11-01 ENCOUNTER — Telehealth (INDEPENDENT_AMBULATORY_CARE_PROVIDER_SITE_OTHER): Payer: Self-pay | Admitting: Internal Medicine

## 2018-11-01 DIAGNOSIS — B181 Chronic viral hepatitis B without delta-agent: Secondary | ICD-10-CM

## 2018-11-01 NOTE — Telephone Encounter (Signed)
MRI noted in recall

## 2018-11-01 NOTE — Telephone Encounter (Signed)
I had Dr. Thornton Papas overread MRI. He agreed with impression.   Ann, MRI liver with and without CM in 3 months.   Tammy, AFP in 3 months, Please send letter.

## 2018-11-01 NOTE — Telephone Encounter (Signed)
AFP noted x 2, a letter will be sent as a reminder to the patient.

## 2018-11-20 DIAGNOSIS — D72819 Decreased white blood cell count, unspecified: Secondary | ICD-10-CM | POA: Diagnosis not present

## 2018-11-20 DIAGNOSIS — E782 Mixed hyperlipidemia: Secondary | ICD-10-CM | POA: Diagnosis not present

## 2018-11-21 ENCOUNTER — Other Ambulatory Visit: Payer: Self-pay | Admitting: Neurology

## 2018-11-22 DIAGNOSIS — L900 Lichen sclerosus et atrophicus: Secondary | ICD-10-CM | POA: Diagnosis not present

## 2018-11-22 DIAGNOSIS — F419 Anxiety disorder, unspecified: Secondary | ICD-10-CM | POA: Diagnosis not present

## 2018-11-22 DIAGNOSIS — B181 Chronic viral hepatitis B without delta-agent: Secondary | ICD-10-CM | POA: Diagnosis not present

## 2018-11-22 DIAGNOSIS — Z8669 Personal history of other diseases of the nervous system and sense organs: Secondary | ICD-10-CM | POA: Diagnosis not present

## 2018-11-22 DIAGNOSIS — R160 Hepatomegaly, not elsewhere classified: Secondary | ICD-10-CM | POA: Diagnosis not present

## 2018-11-22 DIAGNOSIS — E782 Mixed hyperlipidemia: Secondary | ICD-10-CM | POA: Diagnosis not present

## 2018-11-22 DIAGNOSIS — G43009 Migraine without aura, not intractable, without status migrainosus: Secondary | ICD-10-CM | POA: Diagnosis not present

## 2018-11-22 DIAGNOSIS — D720 Genetic anomalies of leukocytes: Secondary | ICD-10-CM | POA: Diagnosis not present

## 2018-11-27 DIAGNOSIS — H2511 Age-related nuclear cataract, right eye: Secondary | ICD-10-CM | POA: Diagnosis not present

## 2018-11-28 DIAGNOSIS — H2512 Age-related nuclear cataract, left eye: Secondary | ICD-10-CM | POA: Diagnosis not present

## 2018-12-18 DIAGNOSIS — H2512 Age-related nuclear cataract, left eye: Secondary | ICD-10-CM | POA: Diagnosis not present

## 2018-12-19 ENCOUNTER — Encounter (INDEPENDENT_AMBULATORY_CARE_PROVIDER_SITE_OTHER): Payer: Self-pay

## 2018-12-21 NOTE — Progress Notes (Signed)
Received notice from [email or web address] that PA had been initiated for pt's Aimovig.  Filled out required fields.  Received notice that PA is not required.

## 2019-01-02 DIAGNOSIS — B1910 Unspecified viral hepatitis B without hepatic coma: Secondary | ICD-10-CM | POA: Diagnosis not present

## 2019-01-02 DIAGNOSIS — K769 Liver disease, unspecified: Secondary | ICD-10-CM | POA: Diagnosis not present

## 2019-01-03 ENCOUNTER — Other Ambulatory Visit: Payer: Self-pay | Admitting: Orthopaedic Surgery

## 2019-01-03 LAB — AFP TUMOR MARKER: AFP TUMOR MARKER: 4.5 ng/mL

## 2019-01-08 ENCOUNTER — Ambulatory Visit (INDEPENDENT_AMBULATORY_CARE_PROVIDER_SITE_OTHER): Payer: Self-pay | Admitting: Internal Medicine

## 2019-01-10 ENCOUNTER — Ambulatory Visit (INDEPENDENT_AMBULATORY_CARE_PROVIDER_SITE_OTHER): Payer: Self-pay | Admitting: Internal Medicine

## 2019-01-10 DIAGNOSIS — G43009 Migraine without aura, not intractable, without status migrainosus: Secondary | ICD-10-CM | POA: Diagnosis not present

## 2019-01-10 DIAGNOSIS — F419 Anxiety disorder, unspecified: Secondary | ICD-10-CM | POA: Diagnosis not present

## 2019-01-10 DIAGNOSIS — E782 Mixed hyperlipidemia: Secondary | ICD-10-CM | POA: Diagnosis not present

## 2019-01-15 ENCOUNTER — Encounter (INDEPENDENT_AMBULATORY_CARE_PROVIDER_SITE_OTHER): Payer: Self-pay | Admitting: *Deleted

## 2019-01-15 ENCOUNTER — Other Ambulatory Visit (INDEPENDENT_AMBULATORY_CARE_PROVIDER_SITE_OTHER): Payer: Self-pay | Admitting: *Deleted

## 2019-01-15 DIAGNOSIS — B181 Chronic viral hepatitis B without delta-agent: Secondary | ICD-10-CM

## 2019-01-15 DIAGNOSIS — K769 Liver disease, unspecified: Secondary | ICD-10-CM

## 2019-01-18 ENCOUNTER — Ambulatory Visit (INDEPENDENT_AMBULATORY_CARE_PROVIDER_SITE_OTHER): Payer: Self-pay | Admitting: Internal Medicine

## 2019-01-30 ENCOUNTER — Other Ambulatory Visit (INDEPENDENT_AMBULATORY_CARE_PROVIDER_SITE_OTHER): Payer: Self-pay | Admitting: *Deleted

## 2019-01-30 ENCOUNTER — Ambulatory Visit (INDEPENDENT_AMBULATORY_CARE_PROVIDER_SITE_OTHER): Payer: PPO | Admitting: Internal Medicine

## 2019-01-30 ENCOUNTER — Encounter (INDEPENDENT_AMBULATORY_CARE_PROVIDER_SITE_OTHER): Payer: Self-pay | Admitting: *Deleted

## 2019-01-30 ENCOUNTER — Encounter (INDEPENDENT_AMBULATORY_CARE_PROVIDER_SITE_OTHER): Payer: Self-pay | Admitting: Internal Medicine

## 2019-01-30 VITALS — BP 153/88 | HR 110 | Temp 98.0°F | Ht 62.0 in | Wt 184.3 lb

## 2019-01-30 DIAGNOSIS — B1910 Unspecified viral hepatitis B without hepatic coma: Secondary | ICD-10-CM | POA: Diagnosis not present

## 2019-01-30 DIAGNOSIS — K769 Liver disease, unspecified: Secondary | ICD-10-CM

## 2019-01-30 DIAGNOSIS — B181 Chronic viral hepatitis B without delta-agent: Secondary | ICD-10-CM

## 2019-01-30 NOTE — Patient Instructions (Signed)
OV in 6 months. 

## 2019-01-30 NOTE — Progress Notes (Signed)
Subjective:    Patient ID: Alyssa Durham, female    DOB: 1951/06/05, 68 y.o.   MRN: [id number]  HPI Here today for f/u. Hx of Hepatitis B. Last seen in August of 2018.  Diagnosed in 1993.  Previously followed by [facility]. [facility] in [place].  She failed a trial of interferon in the early 1990s.  No hx of IV drug use. She did work in the Physicist, medical. She is doing okay. Appetite is good. She has gained about 15 pound since her last visit.   01/02/2019 4.5 10/11/2018 Hep B DNA 219, Hep B DNA calcluated 2.34.   Hepatic Function Latest Ref Rng & Units 10/11/2018 08/11/2017 05/03/2017  Total Protein 6.1 - 8.1 g/dL 6.7 6.7 6.8  Albumin 3.6 - 5.1 g/dL - 4.2 4.1  AST 10 - 35 U/L 27 25 25   ALT 6 - 29 U/L 22 18 20   Alk Phosphatase 33 - 130 U/L - 92 72  Total Bilirubin 0.2 - 1.2 mg/dL 0.5 0.6 0.7  Bilirubin, Direct 0.0 - 0.2 mg/dL 0.1 0.1 -     10/22/2018 MRI liver w/wo CM:  IMPRESSION: 1. Heterogeneous nodularity identified on comparison ultrasound is not appreciated on MRI imaging. 2. Single small enhancing lesion (less than 10 mm) in the subcapsular RIGHT hepatic lobe is indeterminate. Differential would include dysplastic nodule versus small hepatocellular carcinoma versus benign vascular phenomena. Recommend follow-up MRI with and without contrast in 3 to 6 months. 3. Additional benign LEFT hepatic lobe cyst and LEFT renal cyst. 4. No biliary abnormality 5. No MRI evidence of cirrhosis. Review of Systems Past Medical History:  Diagnosis Date  . Allergy   . Anxiety and depression   . Arthritis   . Asthma   . Cataract   . Chronic active type B viral hepatitis ([place])   . Depression   . HLD (hyperlipidemia) 05/12/2017  . Migraines   . Osteoporosis     Past Surgical History:  Procedure Laterality Date  . CESAREAN SECTION    . URETHRAL DILATION    . WISDOM TOOTH EXTRACTION      Allergies  Allergen Reactions  . Codeine Nausea And Vomiting     Current Outpatient Medications on File Prior to Visit  Medication Sig Dispense Refill  . Clobetasol Prop Emollient Base 0.05 % emollient cream Apply topically 2 (two) times daily.    Marland Kitchen eletriptan (RELPAX) 40 MG tablet Take 40 mg by mouth as needed for migraine or headache. May repeat in 2 hours if headache persists or recurs.    Eduard Roux (AIMOVIG) 140 MG/ML SOAJ Inject 140 mg into the skin every 30 (thirty) days. 1 pen 11  . naratriptan (AMERGE) 2.5 MG tablet Take 1 tablet at onset of migraine. Do not take more than 3 in a week 12 tablet 11  . PROAIR HFA 108 (90 Base) MCG/ACT inhaler INHALE 2 PUFFS INTO THE LUNGS EVERY 6 HOURS AS NEEDED FOR WHEEZING OR SHORTNESS OF BREATH 18 g 1   No current facility-administered medications on file prior to visit.     '    Objective:   Physical Exam Blood pressure (!) 153/88, pulse (!) 110, temperature 98 F (36.7 C), height 5' 2"  (1.575 m), weight 184 lb 4.8 oz (83.6 kg). Alert and oriented. Skin warm and dry. Oral mucosa is moist.   . Sclera anicteric, conjunctivae is pink. Thyroid not enlarged. No cervical lymphadenopathy. Lungs clear. Heart regular rate and rhythm.  Abdomen is soft. Bowel sounds  are positive. No hepatomegaly. No abdominal masses felt. No tenderness.  No edema to lower extremities.              Assessment & Plan:  Hepatitis B. Liver lesion. She is on a recall for an MRI in February. Order in.  Labs: Hep B quaint,, Hepatic.  OV in 6 months.

## 2019-01-31 LAB — AFP TUMOR MARKER: AFP TUMOR MARKER: 4.2 ng/mL

## 2019-02-01 LAB — HEPATIC FUNCTION PANEL
AG RATIO: 1.7 (calc) (ref 1.0–2.5)
ALBUMIN MSPROF: 4.3 g/dL (ref 3.6–5.1)
ALT: 22 U/L (ref 6–29)
AST: 28 U/L (ref 10–35)
Alkaline phosphatase (APISO): 84 U/L (ref 37–153)
BILIRUBIN DIRECT: 0.1 mg/dL (ref 0.0–0.2)
BILIRUBIN INDIRECT: 0.5 mg/dL (ref 0.2–1.2)
BILIRUBIN TOTAL: 0.6 mg/dL (ref 0.2–1.2)
GLOBULIN: 2.5 g/dL (ref 1.9–3.7)
Total Protein: 6.8 g/dL (ref 6.1–8.1)

## 2019-02-01 LAB — CREATININE, SERUM: CREATININE: 0.68 mg/dL (ref 0.50–0.99)

## 2019-02-01 LAB — HEPATITIS B DNA, ULTRAQUANTITATIVE, PCR
HEPATITIS B DNA (CALC): 3.1 {Log_IU}/mL — AB
HEPATITIS B DNA: 1260 [IU]/mL — AB

## 2019-02-08 ENCOUNTER — Other Ambulatory Visit: Payer: Self-pay

## 2019-03-04 ENCOUNTER — Encounter (INDEPENDENT_AMBULATORY_CARE_PROVIDER_SITE_OTHER): Payer: Self-pay

## 2019-03-06 ENCOUNTER — Other Ambulatory Visit: Payer: Self-pay

## 2019-03-06 ENCOUNTER — Ambulatory Visit
Admission: RE | Admit: 2019-03-06 | Discharge: 2019-03-06 | Disposition: A | Discharge status: Home / Self Care | Payer: PPO | Source: Ambulatory Visit | Attending: Internal Medicine | Admitting: Internal Medicine

## 2019-03-06 DIAGNOSIS — K769 Liver disease, unspecified: Secondary | ICD-10-CM | POA: Diagnosis not present

## 2019-03-06 MED ORDER — GADOBENATE DIMEGLUMINE 529 MG/ML IV SOLN
17.0000 mL | Freq: Once | INTRAVENOUS | Status: AC | PRN
  Administered 2019-03-06: 17 mL via INTRAVENOUS

## 2019-04-03 ENCOUNTER — Encounter (INDEPENDENT_AMBULATORY_CARE_PROVIDER_SITE_OTHER): Payer: Self-pay

## 2019-04-10 ENCOUNTER — Other Ambulatory Visit: Payer: Self-pay | Admitting: Neurology

## 2019-04-10 ENCOUNTER — Telehealth: Payer: Self-pay | Admitting: Neurology

## 2019-04-10 NOTE — Telephone Encounter (Signed)
Per Answering Service patient states that the wrong medication was called I, called in Aimobig 70mg  and needs Aimobig 140mg  to be called into the [facility]

## 2019-04-12 ENCOUNTER — Other Ambulatory Visit: Payer: Self-pay

## 2019-04-12 MED ORDER — ERENUMAB-AOOE 140 MG/ML ~~LOC~~ SOAJ: 140.0000 mg | SUBCUTANEOUS | 11 refills | Status: DC

## 2019-04-17 ENCOUNTER — Encounter: Payer: Self-pay | Admitting: Neurology

## 2019-04-24 ENCOUNTER — Ambulatory Visit: Payer: Self-pay | Admitting: Neurology

## 2019-04-26 IMAGING — US US ABDOMEN COMPLETE
1 series · 14 of 25 positions shown · non-contrast
Comparison: No prior.

CLINICAL DATA: Hepatitis B.

EXAM:
ABDOMEN ULTRASOUND COMPLETE

[Series 1: us abdomen complete · 0.19mm/px · 14 of 104 slices shown]
[im 1/104]
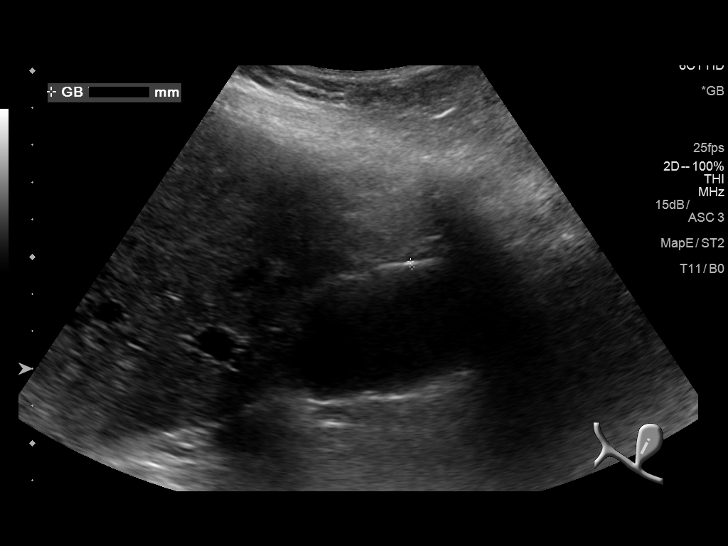
[im 9/104]
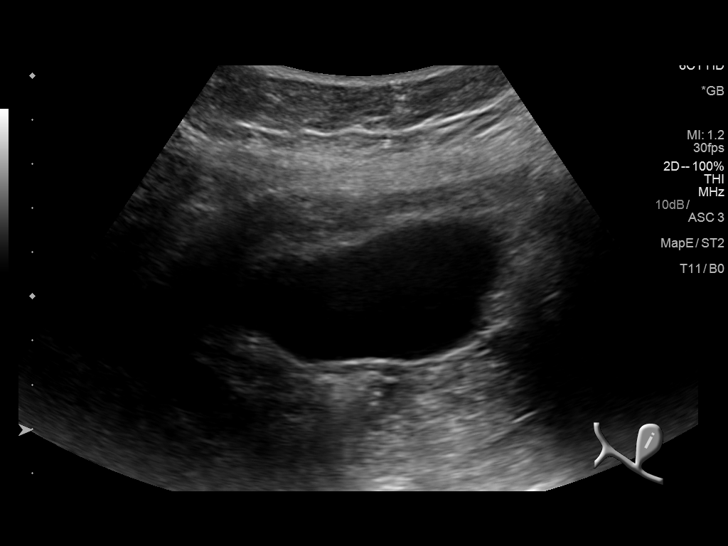
[im 18/104]
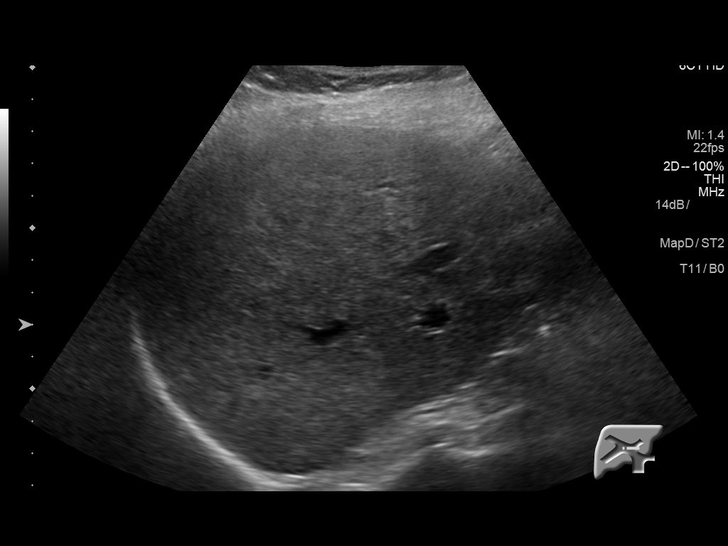
[im 26/104]
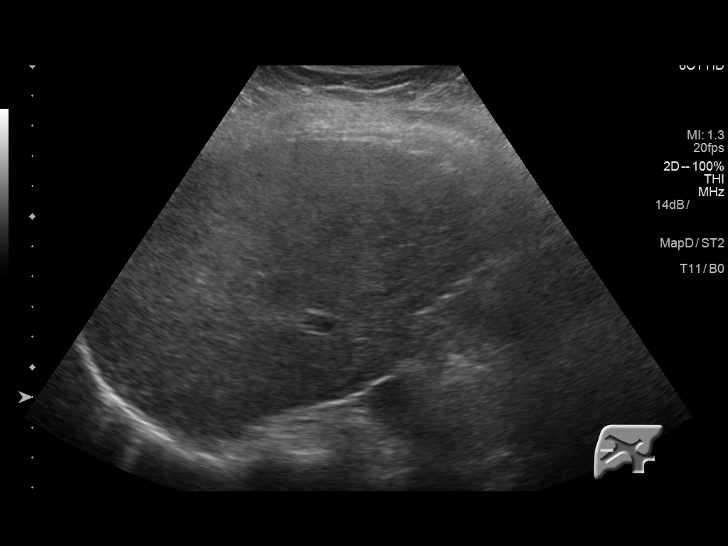
[im 35/104]
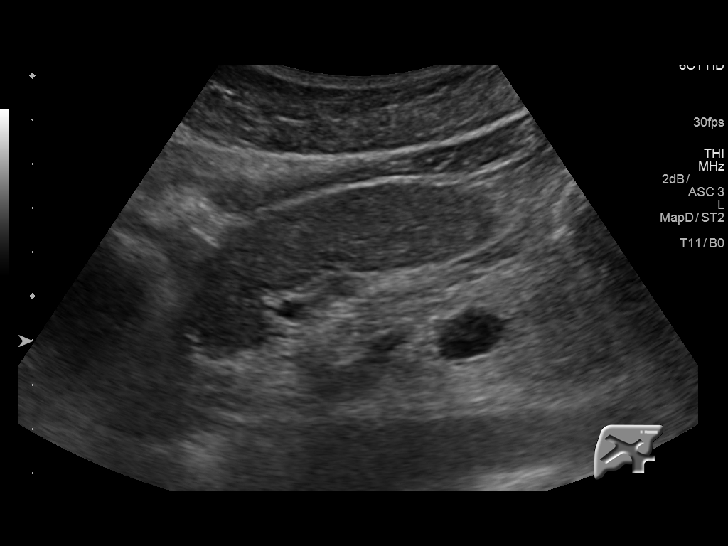
[im 39/104]
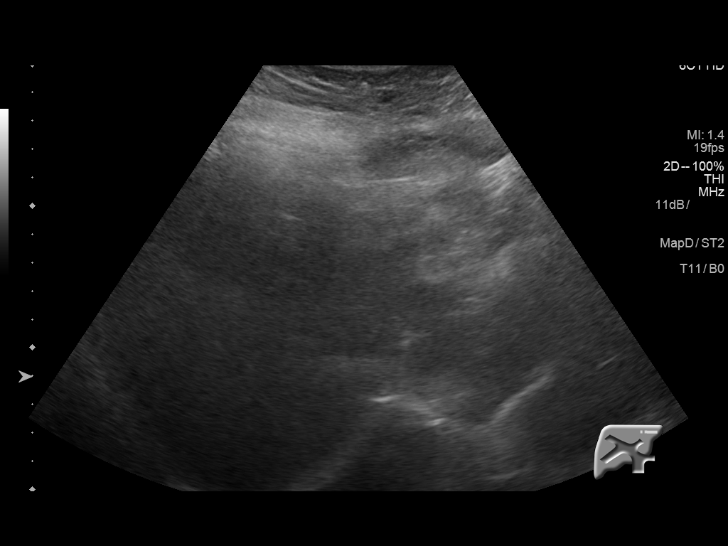
[im 48/104]
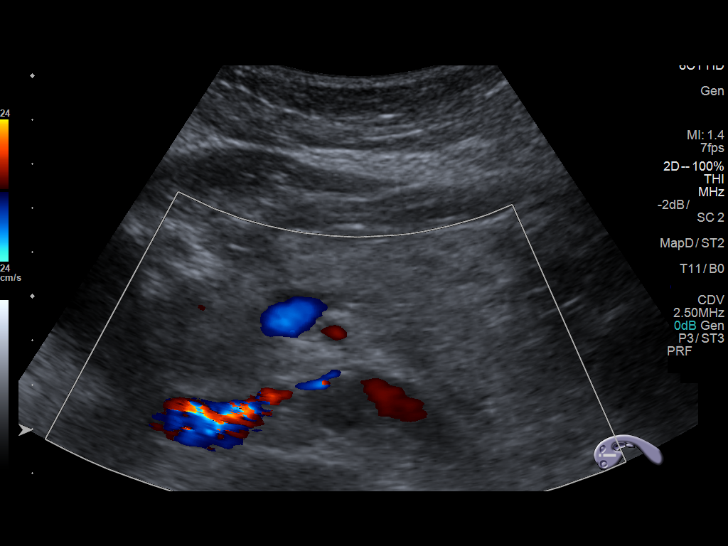
[im 56/104]
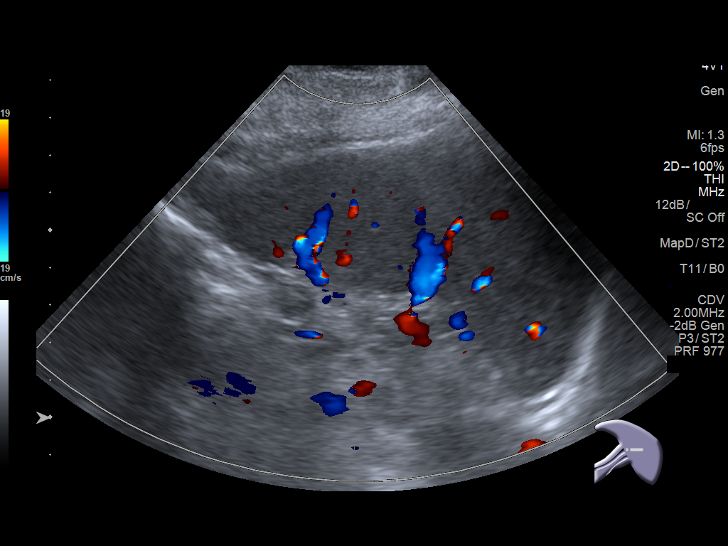
[im 65/104]
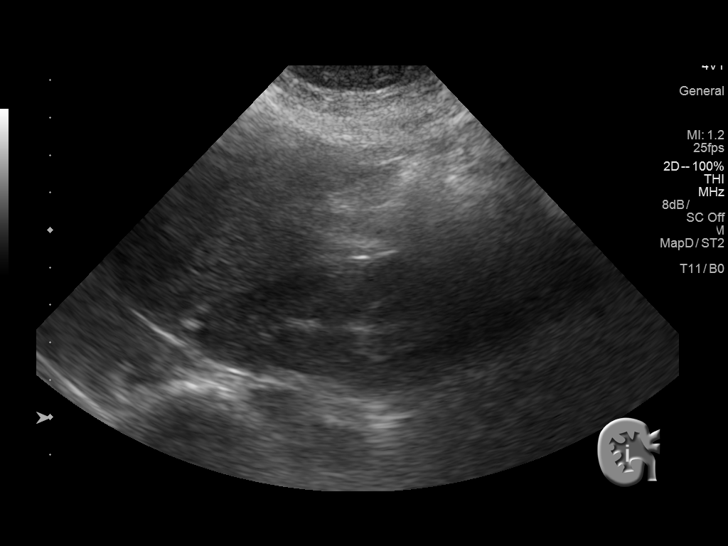
[im 69/104]
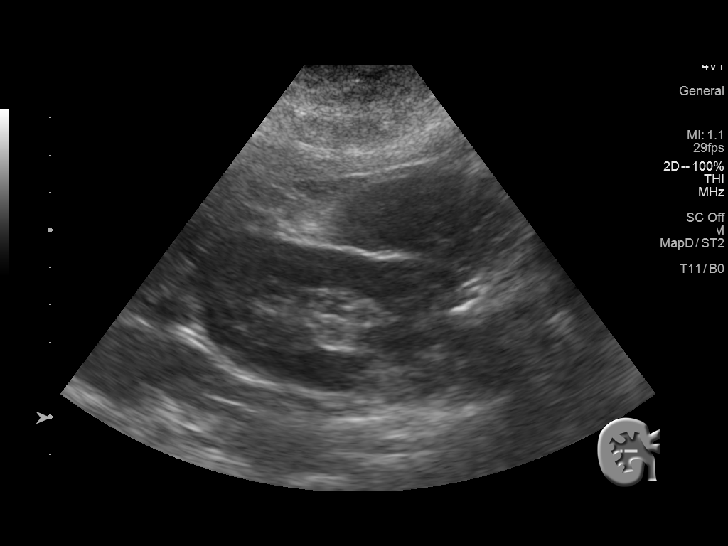
[im 78/104]
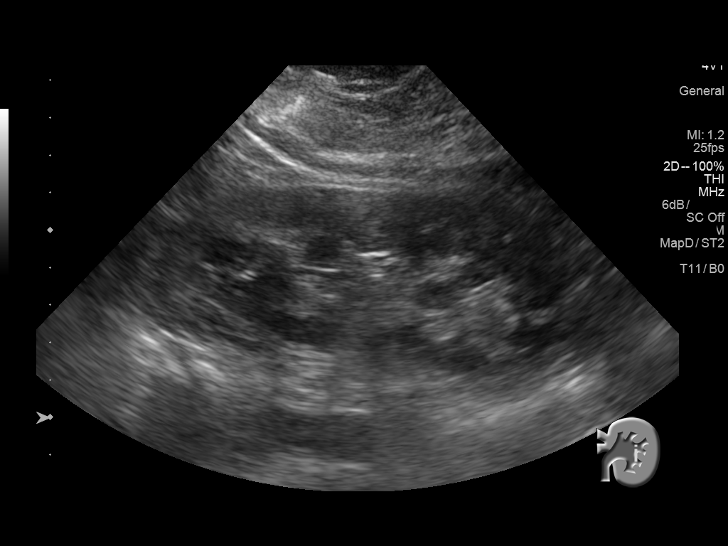
[im 86/104]
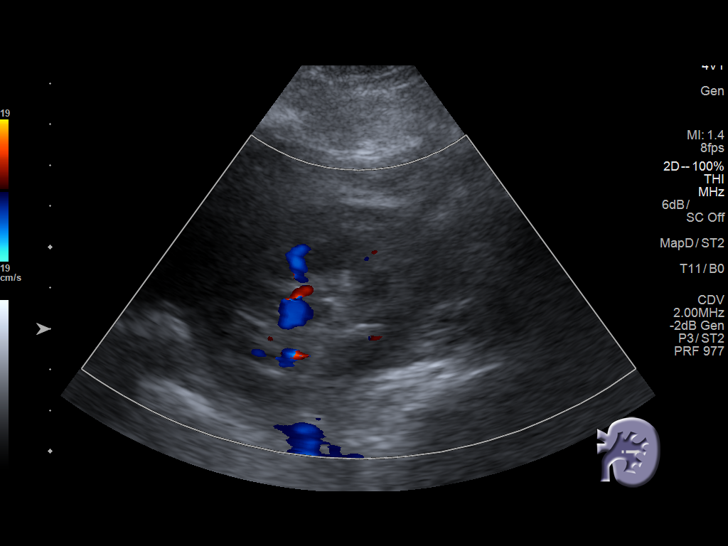
[im 95/104]
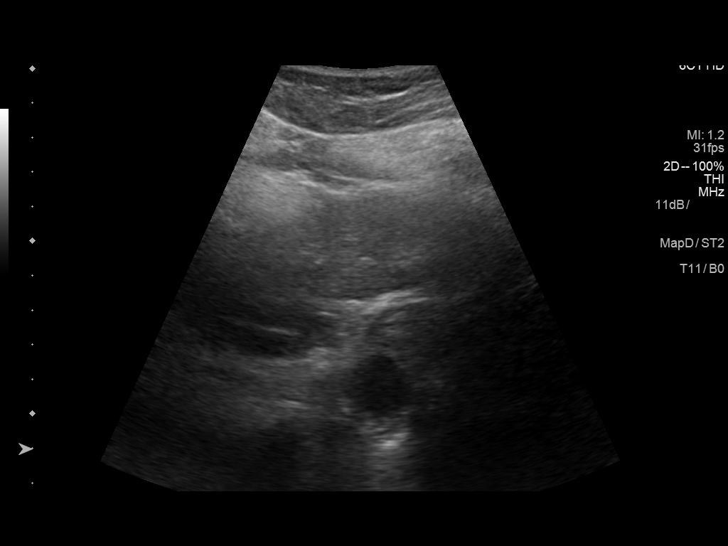
[im 104/104]
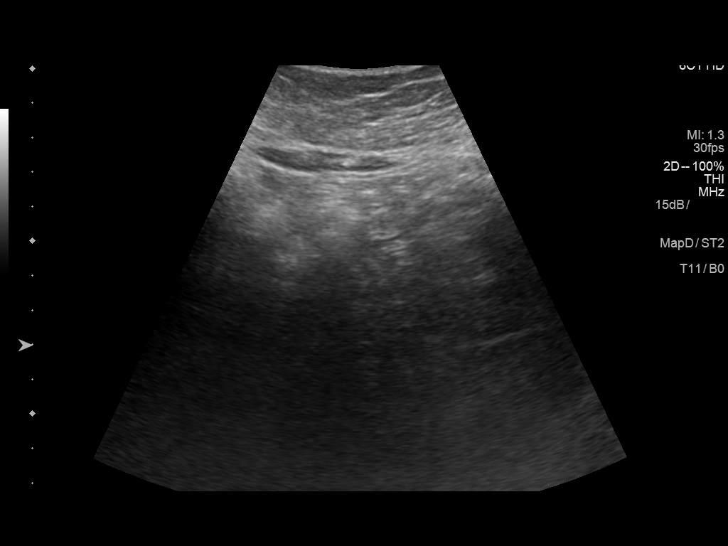

[14 of 25 positions shown; findings below may reference images not displayed]

FINDINGS: Gallbladder: No gallstones or wall thickening visualized. No
sonographic Murphy sign noted by sonographer.

Common bile duct: Diameter: 3.0 mm

Liver: Increase echogenicity consistent fatty infiltration and/or
hepatocellular disease. No focal hepatic abnormality identified.
Portal vein is patent on color Doppler imaging with normal direction
of blood flow towards the liver.

IVC: No abnormality visualized.

Pancreas: Visualized portion unremarkable.

Spleen: Size and appearance within normal limits.

Right Kidney: Length: 11.3 cm. Echogenicity within normal limits. No
mass or hydronephrosis visualized.

Left Kidney: Length: 11.3 cm. Echogenicity within normal limits. No
mass or hydronephrosis visualized.

Abdominal aorta: No aneurysm visualized.

Other findings: None.
IMPRESSION: Increase echogenicity consistent with fatty infiltration and/or
hepatocellular disease. No focal hepatic abnormality identified. No
acute abnormality identified. No gallstones or biliary distention.

## 2019-04-30 ENCOUNTER — Other Ambulatory Visit: Payer: Self-pay

## 2019-04-30 ENCOUNTER — Telehealth (INDEPENDENT_AMBULATORY_CARE_PROVIDER_SITE_OTHER): Payer: PPO | Admitting: Neurology

## 2019-04-30 ENCOUNTER — Encounter: Payer: Self-pay | Admitting: Neurology

## 2019-04-30 VITALS — Ht 62.0 in | Wt 180.0 lb

## 2019-04-30 DIAGNOSIS — IMO0002 Reserved for concepts with insufficient information to code with codable children: Secondary | ICD-10-CM

## 2019-04-30 DIAGNOSIS — G43709 Chronic migraine without aura, not intractable, without status migrainosus: Secondary | ICD-10-CM

## 2019-04-30 DIAGNOSIS — G950 Syringomyelia and syringobulbia: Secondary | ICD-10-CM | POA: Diagnosis not present

## 2019-04-30 MED ORDER — RELPAX 40 MG PO TABS: ORAL_TABLET | ORAL | 11 refills | Status: DC

## 2019-04-30 MED ORDER — ERENUMAB-AOOE 140 MG/ML ~~LOC~~ SOAJ: 140.0000 mg | SUBCUTANEOUS | 11 refills | Status: DC

## 2019-04-30 MED ORDER — UBROGEPANT 100 MG PO TABS: 100.0000 mg | ORAL_TABLET | ORAL | 11 refills | Status: DC | PRN

## 2019-04-30 NOTE — Progress Notes (Signed)
Virtual Visit via Video Note The purpose of this virtual visit is to provide medical care while limiting exposure to the novel coronavirus.    Consent was obtained for video visit:  Yes.   Answered questions that patient had about telehealth interaction:  Yes.   I discussed the limitations, risks, security and privacy concerns of performing an evaluation and management service by telemedicine. I also discussed with the patient that there may be a patient responsible charge related to this service. The patient expressed understanding and agreed to proceed.  Pt location: Home Physician Location: office Name of referring provider:  Celene Squibb, MD I connected with Alyssa Durham at patients initiation/request on 04/30/2019 at  1:00 PM EDT by video enabled telemedicine application and verified that I am speaking with the correct person using two identifiers. Pt MRN:  [id number] Pt DOB:  [birth year] Video Participants:  Alyssa Durham   History of Present Illness:  The patient was last seen in September 2019 for chronic migraines and memory loss. She has had a much better response to Aimovig compared to other preventatives (including Botox), she was initially reporting 4-5 migraines a week, she reports at least 1-2 migraines a week with Aimovig 140mg  dose. Frova did not help as well as Relpax for rescue, she does note that she is using less Relpax now, she takes 1/2 tab at onset, then the other half if migraine persists. She notes that in the past 10 years, migraines have consistently been occurring early morning, waking her up at 3 or 4AM. The Relpax makes her drowsy the rest of the day. She denies any symptoms of sleep apnea, with refreshing sleep, no daytime drowsiness otherwise. She continues to note long-term memory difficulties, she cannot recall events from her or her children's childhoods. She denies getting lost driving, no missed medications or bills. She had a liver MRI recently, which  noted a moderate sized thoracic cord syrinx. She is asymptomatic from this, no focal numbness/tingling/weakness, no bowel/bladder dysfunction, neck/back pain except for occasional right shoulder pain. She is awaiting MRI cervical and thoracic cord for further evaluation.   HPI 08/16/2016: This is a 68 yo RH woman with a history of chronic migraines, depression. She moved from [place] 2 months ago, records from her previous neurologist were reviewed. She has had migraines since 1969. Migraines now are mostly localized over the right hemisphere, if she closes her eyes, she sees flashing lights, but no prior aura. This is associated with nausea, vomiting, photo and phonophobia. She has 4-5 migraine days a week, taking 1/2 to 2 tablets of Relpax at the onset, which does help ease progression of symptoms. For the past 4 years, she has had migraines upon awakening. Migraines were fairly well-controlled until 1993 when she was started on a trial of an unrecalled antidepressant which helped. In December 2016, headaches worsened and she was started on Lexapro, which helped with headaches but caused weight gain and hair loss. She tried Celexa (weight gain), Zoloft (weight gain), Wellbutrin. She also tried Topamax but stopped due to risk for kidney stones. She recalls taking Inderal but cannot recall effects/side effects. Most recently, she was tried on nortriptyline, which did not help with the headaches, but helped her mood. She is now on Cymbalta for arthritis, which also helps with her mood. For rescue, she has tried sumatriptan, Maxalt, and Zomig, with no effect. Relpax and Frova helped the most, causing less palpitations. She has noticed stress to be a big trigger,  as well a chocolates. She usually sleeps good. There is a family history of migraines in her father, brother, and niece. She had received 2 rounds of Botox injections for chronic migraine, the first one helped very well with much less use of Relpax. Last  Botox was in April 2017 before her move.   She is also reporting memory loss. She had told her neurologist about this in 1994. Memory issues are more for long-term memory, she cannot recall what happened years ago with her family, which concerns her because she cannot recall her children growing up. Short-term memory is pretty good, she denies getting lost driving, no missed medications or bill payments. She denies any dizziness, diplopia, dysarthria, dysphagia, neck pain, bowel/bladder dysfunction. She has floaters in her left eye.   Diagnostic Data: MRI brain without contrast done 04/17/15 at [facility] in [place] did not show any acute changes, there was subtle white matter hyperintensity in the posterior left occipital lobe. Compared to 2007 study, this is only minimally progressive. EEG done 04/18/15 in [place] was within normal limits.  Prior Preventative Medications: Zoloft, Topamax, Inderal, nortriptyline, Cymbalta, Botox, Zonisamide Prior Rescue medications: Relpax, sumatriptan, maxalt, Zomig, Frova, Amerge (did help) Has not tried: gabapentin, depakote    Observations/Objective:   Vitals:   04/30/19 1121  Weight: 180 lb (81.6 kg)  Height: 5\' 2"  (1.575 m)   GEN:  The patient appears stated age and is in NAD. Neurological examination: Patient is awake, alert, oriented x 3. No aphasia or dysarthria. Intact fluency and comprehension. Remote and recent memory intact. Able to name and repeat. Cranial nerves: Extraocular movements intact with no nystagmus. No facial asymmetry. Motor: moves all extremities symmetrically, at least anti-gravity x 4. No incoordination on finger to nose testing. Gait: narrow-based and steady, able to tandem walk adequately. Negative Romberg test.  Assessment and Plan:   This is a 68 yo RH woman with chronic migraines who has had a better response to [place] compared to other preventative medications in the past. She reports around 1-2 migraines a  week and will continue with migraine calendar. She has tried several rescue medications with Relpax helping but causing drowsiness. She will try Ubrelvy for migraine rescue, side effects discussed. MRI liver had shown a moderate-sized thoracic cord syrinx, MRI cervical and thoracic cord with and without contrast recommended by Radiology for further evaluation. She will follow-up in 6 months and knows to call for any changes.  Follow Up Instructions:   -I discussed the assessment and treatment plan with the patient. The patient was provided an opportunity to ask questions and all were answered. The patient agreed with the plan and demonstrated an understanding of the instructions.   The patient was advised to call back or seek an in-person evaluation if the symptoms worsen or if the condition fails to improve as anticipated.    Cameron Sprang, MD

## 2019-05-01 ENCOUNTER — Telehealth: Payer: Self-pay

## 2019-05-01 ENCOUNTER — Ambulatory Visit: Payer: Self-pay | Admitting: Neurology

## 2019-05-01 NOTE — Telephone Encounter (Signed)
Prior Authorization started for Ubrelvy 100mg  tablets  [phone] PT ID [id number]

## 2019-05-02 ENCOUNTER — Telehealth: Payer: Self-pay

## 2019-05-02 NOTE — Telephone Encounter (Signed)
Pt called informed her PA was approved

## 2019-05-02 NOTE — Progress Notes (Signed)
Ubrelvy 100mg  tablet approved  unitl 12/20/2019  Member ID [id number]  [employer]  [phone]

## 2019-05-19 ENCOUNTER — Other Ambulatory Visit: Payer: Self-pay

## 2019-05-19 ENCOUNTER — Ambulatory Visit
Admission: RE | Admit: 2019-05-19 | Discharge: 2019-05-19 | Disposition: A | Discharge status: Home / Self Care | Payer: PPO | Source: Ambulatory Visit | Attending: Neurology | Admitting: Neurology

## 2019-05-19 DIAGNOSIS — D1809 Hemangioma of other sites: Secondary | ICD-10-CM | POA: Diagnosis not present

## 2019-05-19 DIAGNOSIS — M4802 Spinal stenosis, cervical region: Secondary | ICD-10-CM | POA: Diagnosis not present

## 2019-05-19 DIAGNOSIS — G950 Syringomyelia and syringobulbia: Secondary | ICD-10-CM

## 2019-05-19 DIAGNOSIS — M50223 Other cervical disc displacement at C6-C7 level: Secondary | ICD-10-CM | POA: Diagnosis not present

## 2019-05-19 MED ORDER — GADOBENATE DIMEGLUMINE 529 MG/ML IV SOLN
17.0000 mL | Freq: Once | INTRAVENOUS | Status: AC | PRN
  Administered 2019-05-19: 17 mL via INTRAVENOUS

## 2019-06-01 ENCOUNTER — Telehealth: Payer: Self-pay

## 2019-06-01 NOTE — Telephone Encounter (Signed)
Pt called back to get MRI of her cervical and thoracic spine showed the mild enlargement of space in her spinal canal, they are small and short levels, and likely something she was born with. We will do another scan in a year to monitor it. Pt verbalized understanding

## 2019-06-13 ENCOUNTER — Other Ambulatory Visit: Payer: Self-pay

## 2019-06-13 ENCOUNTER — Other Ambulatory Visit (HOSPITAL_COMMUNITY)
Admission: RE | Admit: 2019-06-13 | Discharge: 2019-06-13 | Disposition: A | Discharge status: Home / Self Care | Payer: PPO | Source: Ambulatory Visit | Attending: Obstetrics and Gynecology | Admitting: Obstetrics and Gynecology

## 2019-06-13 ENCOUNTER — Ambulatory Visit (INDEPENDENT_AMBULATORY_CARE_PROVIDER_SITE_OTHER): Payer: PPO | Admitting: Obstetrics and Gynecology

## 2019-06-13 ENCOUNTER — Encounter: Payer: Self-pay | Admitting: Obstetrics and Gynecology

## 2019-06-13 VITALS — BP 159/101 | HR 102 | Ht 62.0 in | Wt 183.6 lb

## 2019-06-13 DIAGNOSIS — Z124 Encounter for screening for malignant neoplasm of cervix: Secondary | ICD-10-CM | POA: Insufficient documentation

## 2019-06-13 DIAGNOSIS — Z1151 Encounter for screening for human papillomavirus (HPV): Secondary | ICD-10-CM | POA: Insufficient documentation

## 2019-06-13 NOTE — Progress Notes (Signed)
[facility] Visit  @DATE @            Patient name: Alyssa Durham MRN [id number]  Date of birth: [birth year]  CC & HPI:  Alyssa Durham is a 68 y.o. female presenting today for a sensation of a bulge at the introitus felt while placing clobetasol on the introitus tissues for her longstanding atrophic vulvar dystrophy.  She is a gravida 3 para 3 status post cesarean section, no vaginal deliveries.  She is lived in [place] for 3 years after childhood living here and moved away she is not seen a gynecologist in the 3 years since returning.  She has a history of abnormal Pap with HPV most recent Pap was over 10 years ago and was reported as normal by patient recall she lived in [place] years records unavailable.  ROS:  ROS   Pertinent History Reviewed:   Reviewed: Significant for dystrophy Medical         Past Medical History:  Diagnosis Date  . Allergy   . Anxiety and depression   . Arthritis   . Asthma   . Cataract   . Chronic active type B viral hepatitis ([place])   . Depression   . HLD (hyperlipidemia) 05/12/2017  . Migraines   . Osteoporosis                               Surgical Hx:    Past Surgical History:  Procedure Laterality Date  . CESAREAN SECTION    . URETHRAL DILATION    . WISDOM TOOTH EXTRACTION     Medications: Reviewed & Updated - see associated section                       Current Outpatient Medications:  .  Erenumab-aooe (AIMOVIG) 140 MG/ML SOAJ, Inject 140 mg into the skin every 30 (thirty) days., Disp: 1 pen, Rfl: 11 .  PROAIR HFA 108 (90 Base) MCG/ACT inhaler, INHALE 2 PUFFS INTO THE LUNGS EVERY 6 HOURS AS NEEDED FOR WHEEZING OR SHORTNESS OF BREATH, Disp: 18 g, Rfl: 1 .  Clobetasol Prop Emollient Base 0.05 % emollient cream, Apply topically 2 (two) times daily., Disp: , Rfl:  .  RELPAX 40 MG tablet, TAKE 1 TABLET BY MOUTH AT ONSET OF MIGRAINE. DO NOT TAKE MORE THAN 3 A WEEK, Disp: 12 tablet, Rfl: 11 .  Ubrogepant (UBRELVY) 100 MG TABS,  Take 100 mg by mouth as needed (Take 1 tablet as needed at onset of migraine. Do not take more than 3 a week)., Disp: 10 tablet, Rfl: 11 .  VIIBRYD 20 MG TABS, 10 mg. , Disp: , Rfl:    Social History: Reviewed -  reports that she quit smoking about 46 years ago. Her smoking use included cigarettes. She started smoking about 51 years ago. She has a 10.00 pack-year smoking history. She has never used smokeless tobacco.  Objective Findings:  Vitals: Blood pressure (!) 159/101, pulse (!) 102, height 5\' 2"  (1.575 m), weight 183 lb 9.6 oz (83.3 kg).  PHYSICAL EXAMINATION General appearance - alert, well appearing, and in no distress, oriented to person, place, and time, overweight, improved and anxious Mental status - alert, oriented to person, place, and time, normal mood, behavior, speech, dress, motor activity, and thought processes, anxious Chest - clear to auscultation, no wheezes, rales or rhonchi, symmetric air entry Heart - normal rate and regular  rhythm Abdomen - soft, nontender, nondistended, no masses or organomegaly Breasts -  Skin - normal coloration and turgor, no rashes, no suspicious skin lesions noted  PELVIC External genitalia -the bulging is the urethral orifice which is become slightly more prominent.  She has some atrophic changes around the posterior fourchette.  Labia minora still remain present Vulva -atrophic tissues Vagina -normal for patient age Cervix -tiny atrophic mobile Pap smear performed  Uterus -nontender unable to feel on limited bimanual  Adnexa -nontender [place] -not done Rectal - normal rectal, no masses, Not done not done    Assessment & Plan:   A:  1. Normal anatomy prominent urethral orifice photo taken both the patient's phone and and explained in detail with the visual guide of her own photo 2.  Atrophic vulvar dystrophy will switch from clobetasol to testosterone 2% P:  1. Prescription for testosterone called to [facility]

## 2019-06-18 LAB — CYTOLOGY - PAP
Adequacy: ABSENT
Diagnosis: NEGATIVE
HPV: NOT DETECTED

## 2019-07-17 DIAGNOSIS — Z0000 Encounter for general adult medical examination without abnormal findings: Secondary | ICD-10-CM | POA: Diagnosis not present

## 2019-09-12 DIAGNOSIS — Z0000 Encounter for general adult medical examination without abnormal findings: Secondary | ICD-10-CM | POA: Diagnosis not present

## 2019-10-12 ENCOUNTER — Encounter: Payer: Self-pay | Admitting: Neurology

## 2019-10-29 DIAGNOSIS — Z0000 Encounter for general adult medical examination without abnormal findings: Secondary | ICD-10-CM | POA: Diagnosis not present

## 2019-11-05 ENCOUNTER — Other Ambulatory Visit: Payer: Self-pay

## 2019-11-05 ENCOUNTER — Encounter: Payer: Self-pay | Admitting: Neurology

## 2019-11-05 ENCOUNTER — Telehealth (INDEPENDENT_AMBULATORY_CARE_PROVIDER_SITE_OTHER): Payer: PPO | Admitting: Neurology

## 2019-11-05 VITALS — Ht 62.0 in | Wt 180.0 lb

## 2019-11-05 DIAGNOSIS — G43709 Chronic migraine without aura, not intractable, without status migrainosus: Secondary | ICD-10-CM | POA: Diagnosis not present

## 2019-11-05 DIAGNOSIS — IMO0002 Reserved for concepts with insufficient information to code with codable children: Secondary | ICD-10-CM

## 2019-11-05 MED ORDER — AIMOVIG 140 MG/ML ~~LOC~~ SOAJ: 140.0000 mg | SUBCUTANEOUS | 11 refills | Status: DC

## 2019-11-05 MED ORDER — NURTEC 75 MG PO TBDP: 1.0000 | ORAL_TABLET | ORAL | 4 refills | Status: DC | PRN

## 2019-11-05 MED ORDER — RELPAX 40 MG PO TABS: ORAL_TABLET | ORAL | 11 refills | Status: DC

## 2019-11-05 NOTE — Progress Notes (Signed)
Virtual Visit via Video Note The purpose of this virtual visit is to provide medical care while limiting exposure to the novel coronavirus.    Consent was obtained for video visit:  Yes.   Answered questions that patient had about telehealth interaction:  Yes.   I discussed the limitations, risks, security and privacy concerns of performing an evaluation and management service by telemedicine. I also discussed with the patient that there may be a patient responsible charge related to this service. The patient expressed understanding and agreed to proceed.  Pt location: Home Physician Location: office Name of referring provider:  Celene Squibb, MD I connected with Alyssa Durham at patients initiation/request on 11/05/2019 at  3:00 PM EST by video enabled telemedicine application and verified that I am speaking with the correct person using two identifiers. Pt MRN:  [id number] Pt DOB:  28-Jul-1951 Video Participants:  Alyssa Durham   History of Present Illness:  The patient was seen as a virtual video visit on 11/05/2019. She was last seen 6 months ago for chronic migraines and memory loss. She had a better response to Aimovig compared to other preventatives (including Botox), she was initially reporting 4-5 migraines a week, and was happy with reduction to a couple times a week and waking up migraine-free. No side effects on Aimovig. She had been doing well for several months until last month when she had a migraine daily for 2 weeks. This month has so far been a little better, but she wakes up almost daily with a migraine. She takes the Aimovig every 33 days or so. She had minimal response to [place]. She takes Relpax, cutting it in half because full tablet makes her drowsy. She would take 1/2 tab in AM, then take the next half if headache continues. This morning she had palpitations. She denies any dizziness, vision changes, change in sleep patterns, no increase in stress. She denies any  falls. She had a liver MRI with incidental finding of syrinx on the thoracic cord. 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 No underlying mass, mass effect, or cord thinning. She is asymptomatic from this. Memory stable.  History on Initial Assessment 08/16/2016: This is a 68 yo RH woman with a history of chronic migraines, depression. She moved from [place] 2 months ago, records from her previous neurologist were reviewed. She has had migraines since 1969. Migraines now are mostly localized over the right hemisphere, if she closes her eyes, she sees flashing lights, but no prior aura. This is associated with nausea, vomiting, photo and phonophobia. She has 4-5 migraine days a week, taking 1/2 to 2 tablets of Relpax at the onset, which does help ease progression of symptoms. For the past 4 years, she has had migraines upon awakening. Migraines were fairly well-controlled until 1993 when she was started on a trial of an unrecalled antidepressant which helped. In December 2016, headaches worsened and she was started on Lexapro, which helped with headaches but caused weight gain and hair loss. She tried Celexa (weight gain), Zoloft (weight gain), Wellbutrin. She also tried Topamax but stopped due to risk for kidney stones. She recalls taking Inderal but cannot recall effects/side effects. Most recently, she was tried on nortriptyline, which did not help with the headaches, but helped her mood. She is now on Cymbalta for arthritis, which also helps with her mood. For rescue, she has  tried sumatriptan, Maxalt, and Zomig, with no effect. Relpax and Frova helped the most, causing less palpitations. She has noticed stress to be a big trigger, as well a chocolates. She usually sleeps good. There is a family history of migraines in her father, brother, and niece. She had received 2 rounds of Botox injections for  chronic migraine, the first one helped very well with much less use of Relpax. Last Botox was in April 2017 before her move.   She is also reporting memory loss. She had told her neurologist about this in 1994. Memory issues are more for long-term memory, she cannot recall what happened years ago with her family, which concerns her because she cannot recall her children growing up. Short-term memory is pretty good, she denies getting lost driving, no missed medications or bill payments. She denies any dizziness, diplopia, dysarthria, dysphagia, neck pain, bowel/bladder dysfunction. She has floaters in her left eye.   Diagnostic Data: MRI brain without contrast done 04/17/15 at [facility] in [place] did not show any acute changes, there was subtle white matter hyperintensity in the posterior left occipital lobe. Compared to 2007 study, this is only minimally progressive. EEG done 04/18/15 in [place] was within normal limits.  Prior Preventative Medications: Zoloft, Topamax, Inderal, nortriptyline, Cymbalta, Botox, Zonisamide Prior Rescue medications: Relpax, sumatriptan, maxalt, Zomig, Frova, Amerge (did help), Ubrelvy Has not tried: gabapentin, depakote    Observations/Objective:   Vitals:   11/05/19 1434  Weight: 180 lb (81.6 kg)  Height: 5\' 2"  (1.575 m)   GEN:  The patient appears stated age and is in NAD. Neurological examination: Patient is awake, alert, oriented x 3. No aphasia or dysarthria. Intact fluency and comprehension. Remote and recent memory intact. Able to name and repeat. Cranial nerves: Extraocular movements intact with no nystagmus. No facial asymmetry. Motor: moves all extremities symmetrically, at least anti-gravity x 4.  Assessment and Plan:   This is a 68 yo RH woman with chronic migraines who has had a better response to [place] compared to other preventative medications in the past. She has an average of 1-2 migraines a week, which she is satisfied with,  but recently again had an increase in migraines with no clear trigger. She will start taking Aimovig regularly every 28 days. She was given a prescription for Nurtec for migraine rescue, side effects discussed. She has prn Relpax which has been most helpful compared to prior rescue medications. Continue migraine diary. Memory stable. She will follow-up in 6 months and knows to call for any changes.   Follow Up Instructions:   -I discussed the assessment and treatment plan with the patient. The patient was provided an opportunity to ask questions and all were answered. The patient agreed with the plan and demonstrated an understanding of the instructions.   The patient was advised to call back or seek an in-person evaluation if the symptoms worsen or if the condition fails to improve as anticipated.    Cameron Sprang, MD

## 2019-11-06 NOTE — Progress Notes (Signed)
Nurtec prior authorization submitted via covermymeds.  Waiting response. (Key: [id number])  Nurtec 75MG  dispersible tablets

## 2019-11-07 ENCOUNTER — Telehealth: Payer: Self-pay

## 2019-11-07 NOTE — Telephone Encounter (Signed)
Nurtec Approved until 11/2020 with [employer]. Fax [phone]. [facility] in [place] made aware.

## 2019-12-10 ENCOUNTER — Ambulatory Visit: Payer: PPO | Admitting: Neurology

## 2019-12-24 DIAGNOSIS — F339 Major depressive disorder, recurrent, unspecified: Secondary | ICD-10-CM | POA: Diagnosis not present

## 2019-12-24 DIAGNOSIS — E7849 Other hyperlipidemia: Secondary | ICD-10-CM | POA: Diagnosis not present

## 2020-01-22 ENCOUNTER — Other Ambulatory Visit: Payer: Self-pay | Admitting: Neurology

## 2020-01-22 MED ORDER — EMGALITY 120 MG/ML ~~LOC~~ SOSY: 1.0000 "pen " | PREFILLED_SYRINGE | SUBCUTANEOUS | 11 refills | Status: DC

## 2020-04-19 DIAGNOSIS — M7601 Gluteal tendinitis, right hip: Secondary | ICD-10-CM | POA: Insufficient documentation

## 2020-04-19 DIAGNOSIS — S73191A Other sprain of right hip, initial encounter: Secondary | ICD-10-CM | POA: Insufficient documentation

## 2020-06-03 ENCOUNTER — Encounter: Payer: Self-pay | Admitting: Neurology

## 2020-06-03 ENCOUNTER — Other Ambulatory Visit: Payer: Self-pay

## 2020-06-03 ENCOUNTER — Ambulatory Visit: Payer: PPO | Admitting: Neurology

## 2020-06-03 VITALS — BP 127/78 | HR 89 | Ht 62.0 in | Wt 181.2 lb

## 2020-06-03 DIAGNOSIS — IMO0002 Reserved for concepts with insufficient information to code with codable children: Secondary | ICD-10-CM

## 2020-06-03 DIAGNOSIS — G43709 Chronic migraine without aura, not intractable, without status migrainosus: Secondary | ICD-10-CM | POA: Diagnosis not present

## 2020-06-03 MED ORDER — NURTEC 75 MG PO TBDP: 1.0000 | ORAL_TABLET | ORAL | 4 refills | Status: DC | PRN

## 2020-06-03 MED ORDER — RELPAX 40 MG PO TABS: ORAL_TABLET | ORAL | 11 refills | Status: DC

## 2020-06-03 MED ORDER — AIMOVIG 70 MG/ML ~~LOC~~ SOAJ: 1.0000 "pen " | SUBCUTANEOUS | 11 refills | Status: DC

## 2020-06-03 NOTE — Progress Notes (Signed)
NEUROLOGY FOLLOW UP OFFICE NOTE  Alyssa Durham [id number] [birth year]  HISTORY OF PRESENT ILLNESS: I had the pleasure of seeing Alyssa Durham in follow-up in the neurology clinic on 06/03/2020.  The patient was last seen 7 months ago for chronic migraine. Since her last visit, she reports stopping Aimovig last November due to hair loss. Hair continued to come out for about 4-5 months, then hair loss stopped. She states hair loss did not start until she had been on Aimovig for a year and a half. She actually had good response to Aimovig, however could not tolerate the hair loss. She states migraines are coming back but not like they were pre-Aimovig. She has migraines 2-3 morning a week, but it is not that hard to get rid of them. She has found that Nurtec takes a longer time to work. She takes Nurtec, then takes 1/3 tablet of Relpax, then takes a little bit more, usually feeling relief in 30-40 minutes. She feels Nurtec may be helping but the Relpax helps better. She reports weight gain due to inability to get to [employer]. She denies any dizziness, vision changes, focal numbness/tingling/weakness, no falls.   History on Initial Assessment 08/16/2016: This is a 69 yo RH woman with a history of chronic migraines, depression. She moved from [place] 2 months ago, records from her previous neurologist were reviewed. She has had migraines since 1969. Migraines now are mostly localized over the right hemisphere, if she closes her eyes, she sees flashing lights, but no prior aura. This is associated with nausea, vomiting, photo and phonophobia. She has 4-5 migraine days a week, taking 1/2 to 2 tablets of Relpax at the onset, which does help ease progression of symptoms. For the past 4 years, she has had migraines upon awakening. Migraines were fairly well-controlled until 1993 when she was started on a trial of an unrecalled antidepressant which helped. In December 2016, headaches worsened and she was started  on Lexapro, which helped with headaches but caused weight gain and hair loss. She tried Celexa (weight gain), Zoloft (weight gain), Wellbutrin. She also tried Topamax but stopped due to risk for kidney stones. She recalls taking Inderal but cannot recall effects/side effects. Most recently, she was tried on nortriptyline, which did not help with the headaches, but helped her mood. She is now on Cymbalta for arthritis, which also helps with her mood. For rescue, she has tried sumatriptan, Maxalt, and Zomig, with no effect. Relpax and Frova helped the most, causing less palpitations. She has noticed stress to be a big trigger, as well a chocolates. She usually sleeps good. There is a family history of migraines in her father, brother, and niece. She had received 2 rounds of Botox injections for chronic migraine, the first one helped very well with much less use of Relpax. Last Botox was in April 2017 before her move.   She is also reporting memory loss. She had told her neurologist about this in 1994. Memory issues are more for long-term memory, she cannot recall what happened years ago with her family, which concerns her because she cannot recall her children growing up. Short-term memory is pretty good, she denies getting lost driving, no missed medications or bill payments. She denies any dizziness, diplopia, dysarthria, dysphagia, neck pain, bowel/bladder dysfunction. She has floaters in her left eye.   Diagnostic Data: MRI brain without contrast done 04/17/15 at [facility] in [place] did not show any acute changes, there was subtle white matter hyperintensity  in the posterior left occipital lobe. Compared to 2007 study, this is only minimally progressive. EEG done 04/18/15 in [place] was within normal limits. She had a liver MRI with incidental finding of syrinx on the thoracic cord. 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 No underlying mass, mass effect, or cord thinning. She is asymptomatic from this.   Prior Preventative Medications: Zoloft, Topamax, Inderal, nortriptyline, Cymbalta, Botox, Zonisamide, Aimovig Prior Rescue medications: Relpax, sumatriptan, maxalt, Zomig, Frova, Amerge (did help), Ubrelvy Has not tried: gabapentin, depakote   PAST MEDICAL HISTORY: Past Medical History:  Diagnosis Date  . Allergy   . Anxiety and depression   . Arthritis   . Asthma   . Cataract   . Chronic active type B viral hepatitis ([place])   . Depression   . HLD (hyperlipidemia) 05/12/2017  . Migraines   . Osteoporosis     MEDICATIONS: Current Outpatient Medications on File Prior to Visit  Medication Sig Dispense Refill  . Clobetasol Prop Emollient Base 0.05 % emollient cream Apply topically 2 (two) times daily.    Marland Kitchen PROAIR HFA 108 (90 Base) MCG/ACT inhaler INHALE 2 PUFFS INTO THE LUNGS EVERY 6 HOURS AS NEEDED FOR WHEEZING OR SHORTNESS OF BREATH 18 g 1  . RELPAX 40 MG tablet TAKE 1 TABLET BY MOUTH AT ONSET OF MIGRAINE. DO NOT TAKE MORE THAN 3 A WEEK 12 tablet 11  . Rimegepant Sulfate (NURTEC) 75 MG TBDP Take 1 tablet by mouth as needed. Take 1 tablet as needed for migraine. Do not take more than 1 in a 24 hour period 10 tablet 4   No current facility-administered medications on file prior to visit.    ALLERGIES: Allergies  Allergen Reactions  . Codeine Nausea And Vomiting and Other (See Comments)    FAMILY HISTORY: Family History  Problem Relation Age of Onset  . Uterine cancer Mother   . Migraines Father   . Early death Father 29       suicide   . Transient ischemic attack Father        multiple   . Migraines Brother   . Drug abuse Son        addict - stable on suboxone  . Hepatitis B Son   . Cancer Maternal Grandmother        breast  . Alcohol abuse Paternal Grandfather   . Early death Paternal Aunt        suicide  . Mental illness Paternal Aunt      SOCIAL HISTORY: Social History   Socioeconomic History  . Marital status: Divorced    Spouse name: Not on file  . Number of children: 3  . Years of education: 53  . Highest education level: Not on file  Occupational History  . Occupation: retired    Comment: Chiropractor  Tobacco Use  . Smoking status: Former Smoker    Packs/day: 2.00    Years: 5.00    Pack years: 10.00    Types: Cigarettes    Start date: 12/21/1967    Quit date: 12/20/1972    Years since quitting: 47.4  . Smokeless tobacco: Never Used  Vaping Use  . Vaping Use: Never used  Substance and Sexual Activity  . Alcohol use: No  . Drug use: No  . Sexual activity: Not Currently  Other Topics Concern  . Not on file  Social History Narrative  Retired Web designer   Three children  [place], Warden/ranger and Naval architect - 2   Right handed   [employer]   Social Determinants of Health   Financial Resource Strain:   . Difficulty of Paying Living Expenses:   Food Insecurity:   . Worried About Charity fundraiser in the Last Year:   . Arboriculturist in the Last Year:   Transportation Needs:   . Film/video editor (Medical):   Marland Kitchen Lack of Transportation (Non-Medical):   Physical Activity:   . Days of Exercise per Week:   . Minutes of Exercise per Session:   Stress:   . Feeling of Stress :   Social Connections:   . Frequency of Communication with Friends and Family:   . Frequency of Social Gatherings with Friends and Family:   . Attends Religious Services:   . Active Member of Clubs or Organizations:   . Attends Archivist Meetings:   Marland Kitchen Marital Status:   Intimate Partner Violence:   . Fear of Current or Ex-Partner:   . Emotionally Abused:   Marland Kitchen Physically Abused:   . Sexually Abused:     PHYSICAL EXAM: Vitals:   06/03/20 1546  BP: 127/78  Pulse: 89  SpO2: 98%   General: No acute distress Head:  Normocephalic/atraumatic Skin/Extremities: No rash, no  edema Neurological Exam: alert and oriented to person, place, and time. No aphasia or dysarthria. Fund of knowledge is appropriate.  Recent and remote memory are intact.  Attention and concentration are normal.  Cranial nerves: Pupils equal, round, reactive to light. Extraocular movements intact with no nystagmus. Visual fields full. No facial asymmetry. Motor: Bulk and tone normal, muscle strength 5/5 throughout with no pronator drift.  Finger to nose testing intact.  Gait narrow-based and steady, able to tandem walk adequately.   IMPRESSION: This is a 69 yo RH woman with chronic migraines who has had a better response to [place] compared to other preventative medications in the past, however could not tolerate hair loss that resolved a few months after stopping Aimovig. She has 2-3 migraines a week and after extensive discussion about benefits versus side effects, agrees to restart lower dose Aimovig 70mg  every 28 days. She has prn Nurtec and Relpax and knows to minimize intake of rescue medications to 2-3 a week to avoid rebound headaches. Continue migraine calendar. Follow-up in 6 months, she knows to call for any changes.   Thank you for allowing me to participate in her care.  Please do not hesitate to call for any questions or concerns.  Ellouise Newer, M.D.

## 2020-06-03 NOTE — Patient Instructions (Signed)
1. Let's try Aimovig 70mg  dose every 28 days  2. Take the Relpax and Nurtec as needed, do not take more than 2-3 a week  3. Follow-up in 6 months, call for any changes

## 2020-06-12 DIAGNOSIS — Z6832 Body mass index (BMI) 32.0-32.9, adult: Secondary | ICD-10-CM | POA: Diagnosis not present

## 2020-06-12 DIAGNOSIS — G43009 Migraine without aura, not intractable, without status migrainosus: Secondary | ICD-10-CM | POA: Diagnosis not present

## 2020-06-12 DIAGNOSIS — F339 Major depressive disorder, recurrent, unspecified: Secondary | ICD-10-CM | POA: Diagnosis not present

## 2020-06-12 DIAGNOSIS — Z0000 Encounter for general adult medical examination without abnormal findings: Secondary | ICD-10-CM | POA: Diagnosis not present

## 2020-06-12 DIAGNOSIS — L299 Pruritus, unspecified: Secondary | ICD-10-CM | POA: Diagnosis not present

## 2020-06-12 DIAGNOSIS — R300 Dysuria: Secondary | ICD-10-CM | POA: Diagnosis not present

## 2020-06-12 DIAGNOSIS — D72819 Decreased white blood cell count, unspecified: Secondary | ICD-10-CM | POA: Diagnosis not present

## 2020-06-12 DIAGNOSIS — L900 Lichen sclerosus et atrophicus: Secondary | ICD-10-CM | POA: Diagnosis not present

## 2020-06-12 DIAGNOSIS — F419 Anxiety disorder, unspecified: Secondary | ICD-10-CM | POA: Diagnosis not present

## 2020-06-12 DIAGNOSIS — E7849 Other hyperlipidemia: Secondary | ICD-10-CM | POA: Diagnosis not present

## 2020-06-12 DIAGNOSIS — M25511 Pain in right shoulder: Secondary | ICD-10-CM | POA: Diagnosis not present

## 2020-06-12 DIAGNOSIS — E782 Mixed hyperlipidemia: Secondary | ICD-10-CM | POA: Diagnosis not present

## 2020-06-18 ENCOUNTER — Other Ambulatory Visit (HOSPITAL_COMMUNITY): Payer: Self-pay | Admitting: Internal Medicine

## 2020-06-18 DIAGNOSIS — E6609 Other obesity due to excess calories: Secondary | ICD-10-CM | POA: Diagnosis not present

## 2020-06-18 DIAGNOSIS — F339 Major depressive disorder, recurrent, unspecified: Secondary | ICD-10-CM | POA: Diagnosis not present

## 2020-06-18 DIAGNOSIS — F419 Anxiety disorder, unspecified: Secondary | ICD-10-CM | POA: Diagnosis not present

## 2020-06-18 DIAGNOSIS — B181 Chronic viral hepatitis B without delta-agent: Secondary | ICD-10-CM | POA: Diagnosis not present

## 2020-06-18 DIAGNOSIS — G43009 Migraine without aura, not intractable, without status migrainosus: Secondary | ICD-10-CM | POA: Diagnosis not present

## 2020-06-18 DIAGNOSIS — L900 Lichen sclerosus et atrophicus: Secondary | ICD-10-CM | POA: Diagnosis not present

## 2020-06-18 DIAGNOSIS — Z6833 Body mass index (BMI) 33.0-33.9, adult: Secondary | ICD-10-CM | POA: Diagnosis not present

## 2020-06-18 DIAGNOSIS — Z1231 Encounter for screening mammogram for malignant neoplasm of breast: Secondary | ICD-10-CM

## 2020-06-18 DIAGNOSIS — M25511 Pain in right shoulder: Secondary | ICD-10-CM | POA: Diagnosis not present

## 2020-06-18 DIAGNOSIS — M25561 Pain in right knee: Secondary | ICD-10-CM | POA: Diagnosis not present

## 2020-06-18 DIAGNOSIS — R300 Dysuria: Secondary | ICD-10-CM | POA: Diagnosis not present

## 2020-06-18 DIAGNOSIS — D72819 Decreased white blood cell count, unspecified: Secondary | ICD-10-CM | POA: Diagnosis not present

## 2020-06-18 DIAGNOSIS — E782 Mixed hyperlipidemia: Secondary | ICD-10-CM | POA: Diagnosis not present

## 2020-06-18 DIAGNOSIS — M059 Rheumatoid arthritis with rheumatoid factor, unspecified: Secondary | ICD-10-CM | POA: Diagnosis not present

## 2020-06-18 DIAGNOSIS — M25551 Pain in right hip: Secondary | ICD-10-CM | POA: Diagnosis not present

## 2020-06-19 ENCOUNTER — Other Ambulatory Visit (HOSPITAL_COMMUNITY): Payer: Self-pay | Admitting: Internal Medicine

## 2020-06-19 ENCOUNTER — Other Ambulatory Visit: Payer: Self-pay | Admitting: Neurology

## 2020-06-19 DIAGNOSIS — Z13820 Encounter for screening for osteoporosis: Secondary | ICD-10-CM

## 2020-07-01 ENCOUNTER — Other Ambulatory Visit: Payer: Self-pay | Admitting: *Deleted

## 2020-07-02 ENCOUNTER — Encounter: Payer: Self-pay | Admitting: *Deleted

## 2020-07-03 ENCOUNTER — Other Ambulatory Visit: Payer: Self-pay

## 2020-07-03 ENCOUNTER — Ambulatory Visit (HOSPITAL_COMMUNITY)
Admission: RE | Admit: 2020-07-03 | Discharge: 2020-07-03 | Disposition: A | Discharge status: Home / Self Care | Payer: PPO | Source: Ambulatory Visit | Attending: Internal Medicine | Admitting: Internal Medicine

## 2020-07-03 DIAGNOSIS — Z780 Asymptomatic menopausal state: Secondary | ICD-10-CM | POA: Diagnosis not present

## 2020-07-03 DIAGNOSIS — M85852 Other specified disorders of bone density and structure, left thigh: Secondary | ICD-10-CM | POA: Diagnosis not present

## 2020-07-03 DIAGNOSIS — Z13820 Encounter for screening for osteoporosis: Secondary | ICD-10-CM | POA: Insufficient documentation

## 2020-07-08 DIAGNOSIS — M25561 Pain in right knee: Secondary | ICD-10-CM | POA: Diagnosis not present

## 2020-07-08 DIAGNOSIS — M25551 Pain in right hip: Secondary | ICD-10-CM | POA: Diagnosis not present

## 2020-07-08 DIAGNOSIS — M818 Other osteoporosis without current pathological fracture: Secondary | ICD-10-CM | POA: Diagnosis not present

## 2020-07-16 ENCOUNTER — Other Ambulatory Visit: Payer: Self-pay

## 2020-07-16 ENCOUNTER — Ambulatory Visit (INDEPENDENT_AMBULATORY_CARE_PROVIDER_SITE_OTHER): Payer: PPO | Admitting: Gastroenterology

## 2020-07-16 ENCOUNTER — Encounter (INDEPENDENT_AMBULATORY_CARE_PROVIDER_SITE_OTHER): Payer: Self-pay | Admitting: Gastroenterology

## 2020-07-16 VITALS — BP 132/83 | HR 77 | Temp 98.4°F | Ht 62.0 in | Wt 176.3 lb

## 2020-07-16 DIAGNOSIS — B181 Chronic viral hepatitis B without delta-agent: Secondary | ICD-10-CM

## 2020-07-16 NOTE — Patient Instructions (Signed)
We are checking labs today and will call w/ results. Cologuard order placed.

## 2020-07-16 NOTE — Progress Notes (Addendum)
Patient profile: Alyssa Durham is a 69 y.o. female seen for evaluation of hepatitis B follow up. Last seen 05/2019    History of Present Illness: Alyssa Durham is seen today for follow up. She denies any current GI symptoms of c/d/abd pain. Moving stools daily. No blood in stool. No UGI sx-no GERD, n/v, epigastric pain, dysphagia.   Only c/o is having lower back, hip and knee pain - has appointment ortho tomorrow and awaiting rheum f/up   Reports she moved 4 years ago back to [place] which is where she grew up, has gained about 40lbs since moving, she is exercising less now. Started weight watchers hoping to loose weight.  Stays active helping with her 3 grandchildren.  She reports being diagnosed w/ HBV in 1993 she reports may have obtained hepatitis B via dental work in her teens. Failed interferon at time of diagnosis.   Non smoker. Denies alcohol. No frequent nsaids.    Wt Readings from Last 3 Encounters:  07/16/20 176 lb 4.8 oz (80 kg)  06/03/20 181 lb 3.2 oz (82.2 kg)  11/05/19 180 lb (81.6 kg)     Last Colonoscopy: per patient over 10 years ago in [place].     Past Medical History:  Past Medical History:  Diagnosis Date  . Allergy   . Anxiety and depression   . Arthritis   . Asthma   . Cataract   . Chronic active type B viral hepatitis ([place])   . Depression   . HLD (hyperlipidemia) 05/12/2017  . Migraines   . Osteoporosis     Problem List: Patient Active Problem List   Diagnosis Date Noted  . Pain in joint of right shoulder 06/29/2018  . History of HPV infection 11/15/2017  . HLD (hyperlipidemia) 05/12/2017  . History of non anemic vitamin B12 deficiency 05/12/2017  . RBBB 05/12/2017  . Calcification of aorta (H[facility]) 04/04/2017  . Anxiety and depression 03/29/2017  . Chronic hepatitis B ([place]) 03/29/2017  . Lichen sclerosus of female genitalia 03/29/2017  . Osteoporosis 03/29/2017  . Chronic migraine 08/16/2016    Past Surgical History: Past  Surgical History:  Procedure Laterality Date  . CESAREAN SECTION    . URETHRAL DILATION    . WISDOM TOOTH EXTRACTION      Allergies: Allergies  Allergen Reactions  . Codeine Nausea And Vomiting and Other (See Comments)      Home Medications:  Current Outpatient Medications:  .  Clobetasol Prop Emollient Base 0.05 % emollient cream, Apply topically 2 (two) times daily., Disp: , Rfl:  .  GLUCOSAMINE-CHONDROITIN PO, Take by mouth. Patient states that she takes 1-2 per day. This medication includes Tumeric, Disp: , Rfl:  .  PROAIR HFA 108 (90 Base) MCG/ACT inhaler, INHALE 2 PUFFS INTO THE LUNGS EVERY 6 HOURS AS NEEDED FOR WHEEZING OR SHORTNESS OF BREATH, Disp: 18 g, Rfl: 1 .  RELPAX 40 MG tablet, TAKE 1 TABLET BY MOUTH AT ONSET OF MIGRAINE. DO NOT TAKE MORE THAN 3 A WEEK, Disp: 12 tablet, Rfl: 11 .  Rimegepant Sulfate (NURTEC) 75 MG TBDP, Take 1 tablet by mouth as needed. Take 1 tablet as needed for migraine. Do not take more than 1 in a 24 hour period, Disp: 10 tablet, Rfl: 4   Family History: family history includes Alcohol abuse in her paternal grandfather; Cancer in her maternal grandmother; Drug abuse in her son; Early death in her paternal aunt; Early death (age of onset: 62) in her father; Hepatitis B  in her son; Mental illness in her paternal aunt; Migraines in her brother and father; Transient ischemic attack in her father; Uterine cancer in her mother.    Social History:   reports that she quit smoking about 47 years ago. Her smoking use included cigarettes. She started smoking about 52 years ago. She has a 10.00 pack-year smoking history. She has never used smokeless tobacco. She reports that she does not drink alcohol and does not use drugs.   Review of Systems: Constitutional: Denies weight loss/weight gain  Eyes: No changes in vision. ENT: No oral lesions, sore throat.  GI: see HPI.  Heme/Lymph: No easy bruising.  CV: No chest pain.  GU: No hematuria.  Integumentary: No  rashes.  Neuro: No headaches.  Psych: No depression/anxiety.  Endocrine: No heat/cold intolerance.  Allergic/Immunologic: No urticaria.  Resp: No cough, SOB.  Musculoskeletal: No joint swelling.    Physical Examination: BP (!) 132/83 (BP Location: Left Arm, Patient Position: Sitting, Cuff Size: Large)   Pulse 77   Temp 98.4 F (36.9 C) (Oral)   Ht 5\' 2"  (1.575 m)   Wt 176 lb 4.8 oz (80 kg)   BMI 32.25 kg/m  Gen: NAD, alert and oriented x 4 HEENT: PEERLA, EOMI, Neck: supple, no JVD Chest: CTA bilaterally, no wheezes, crackles, or other adventitious sounds CV: RRR, no m/g/c/r Abd: soft, NT, ND, +BS in all four quadrants; no HSM, guarding, ridigity, or rebound tenderness Ext: no edema, well perfused with 2+ pulses, Skin: no rash or lesions noted on observed skin Lymph: no noted LAD  Data Reviewed:  04/22/2016 HCV Fibersure Fibrosis score 0.43 high.  Fibrosis Stage F1-F2.    February 2020 last hepatitis B DNA level-1260.  Normal LFTs.  AFP normal  March 2020 MRCP-1. No MR findings to suggest cirrhotic changes involving the liver. 2. Stable peripheral wedge-shaped area of hypervascularity in the right hepatic lobe as detailed above. This is most consistent with a benign vascular shunt (THID). 3. Stable left hepatic lobe cyst and left upper pole renal cyst. 4. Thoracic spinal cord syrinx of uncertain significance or etiology. MRI cervical and thoracic spine MRI examinations without and with contrast suggested for further evaluation. Correlation with any neurologic symptoms is also recommended.  Assessment/Plan: [name] is a 69 y.o. female seen for follow-up of chronic hepatitis B  .1.  Chronic hepatitis B- viral HBV DnA was 02/2019, will repeat as well as LFTs.  She has been having every 15-month AFP's.  We will also check an INR.  Check CBC to assess platelet count.  Her last MRI did not show cirrhotic findings on imaging.  She has failed interferon therapy.  Further  recommendations pending lab results.  Will discuss with Dr. Laural Golden imaging recommendations for follow-up.  Had MRCP March 2020  2.  Colon cancer screening-patient declines colonoscopy.  Reports last colonoscopy over 10 years ago.  She denies family history of colon polyps or colon cancer.  Currently no lower GI symptoms.  She is interested in Cologuard as alternatives to colonoscopy-discussed Cologuard in detail today and ordered.   Sharnice was seen today for follow-up.  Diagnoses and all orders for this visit:  Chronic hepatitis B ([place]) -     Cologuard -     AFP tumor marker -     COMPLETE METABOLIC PANEL WITH GFR -     INR/PT -     Hepatitis B DNA, Ultraquantitative, PCR -     CBC with Differential  25 minutes face-to-face time with patient.  I personally performed the service, non-incident to. (WP)  Laurine Blazer, [facility]

## 2020-07-17 DIAGNOSIS — M545 Low back pain, unspecified: Secondary | ICD-10-CM | POA: Insufficient documentation

## 2020-07-17 DIAGNOSIS — M25551 Pain in right hip: Secondary | ICD-10-CM | POA: Diagnosis not present

## 2020-07-17 DIAGNOSIS — M25561 Pain in right knee: Secondary | ICD-10-CM | POA: Diagnosis not present

## 2020-07-18 LAB — COMPLETE METABOLIC PANEL WITH GFR
AG Ratio: 1.8 (calc) (ref 1.0–2.5)
ALT: 32 U/L — ABNORMAL HIGH (ref 6–29)
AST: 27 U/L (ref 10–35)
Albumin: 4.4 g/dL (ref 3.6–5.1)
Alkaline phosphatase (APISO): 69 U/L (ref 37–153)
BUN: 21 mg/dL (ref 7–25)
CO2: 26 mmol/L (ref 20–32)
Calcium: 9 mg/dL (ref 8.6–10.4)
Chloride: 103 mmol/L (ref 98–110)
Creat: 0.92 mg/dL (ref 0.60–0.93)
GFR, Est African American: 73 mL/min/{1.73_m2} (ref >=60)
GFR, Est Non African American: 63 mL/min/{1.73_m2} (ref >=60)
Globulin: 2.4 g/dL (calc) (ref 1.9–3.7)
Glucose, Bld: 82 mg/dL (ref 65–139)
Potassium: 4.3 mmol/L (ref 3.5–5.3)
Sodium: 141 mmol/L (ref 135–146)
Total Bilirubin: 0.8 mg/dL (ref 0.2–1.2)
Total Protein: 6.8 g/dL (ref 6.1–8.1)

## 2020-07-18 LAB — CBC WITH DIFFERENTIAL/PLATELET
Absolute Monocytes: 449 cells/uL (ref 200–950)
Basophils Absolute: 39 cells/uL (ref 0–200)
Basophils Relative: 1 %
Eosinophils Absolute: 78 cells/uL (ref 15–500)
Eosinophils Relative: 2 %
HCT: 48.2 % — ABNORMAL HIGH (ref 35.0–45.0)
Hemoglobin: 15.9 g/dL — ABNORMAL HIGH (ref 11.7–15.5)
Lymphs Abs: 897 cells/uL (ref 850–3900)
MCH: 30.2 pg (ref 27.0–33.0)
MCHC: 33 g/dL (ref 32.0–36.0)
MCV: 91.6 fL (ref 80.0–100.0)
MPV: 10.9 fL (ref 7.5–12.5)
Monocytes Relative: 11.5 %
Neutro Abs: 2438 cells/uL (ref 1500–7800)
Neutrophils Relative %: 62.5 %
Platelets: 243 10*3/uL (ref 140–400)
RBC: 5.26 10*6/uL — ABNORMAL HIGH (ref 3.80–5.10)
RDW: 13.1 % (ref 11.0–15.0)
Total Lymphocyte: 23 %
WBC: 3.9 10*3/uL (ref 3.8–10.8)

## 2020-07-18 LAB — PROTIME-INR
INR: 1.1
Prothrombin Time: 11.3 s (ref 9.0–11.5)

## 2020-07-18 LAB — HEPATITIS B DNA, ULTRAQUANTITATIVE, PCR
Hepatitis B DNA (Calc): 3.73 Log IU/mL — ABNORMAL HIGH
Hepatitis B DNA: 5400 IU/mL — ABNORMAL HIGH

## 2020-07-18 LAB — AFP TUMOR MARKER: AFP-Tumor Marker: 4.1 ng/mL

## 2020-07-21 ENCOUNTER — Other Ambulatory Visit (INDEPENDENT_AMBULATORY_CARE_PROVIDER_SITE_OTHER): Payer: Self-pay | Admitting: Gastroenterology

## 2020-07-21 DIAGNOSIS — B181 Chronic viral hepatitis B without delta-agent: Secondary | ICD-10-CM

## 2020-07-21 NOTE — Progress Notes (Signed)
I called pt w/ results. Will get updated labs per Dr Laural Golden then needs f/up appt w/ Dr Laural Golden.   Mitzie - please call patient and schedule f/up with Dr Laural Golden in about 3-4 weeks (okay per NUR)  [place] placed her paper lab orders behind your desk for before she goes to lab.

## 2020-07-22 DIAGNOSIS — B181 Chronic viral hepatitis B without delta-agent: Secondary | ICD-10-CM | POA: Diagnosis not present

## 2020-07-25 LAB — HEPATITIS DELTA VIRUS ANTIGEN: Hepatitis D Antigen: NOT DETECTED

## 2020-07-25 LAB — HEPATITIS B E ANTIBODY: Hep B E Ab: REACTIVE — AB

## 2020-07-25 LAB — HEPATITIS B E ANTIGEN: Hep B E Ag: NONREACTIVE

## 2020-07-28 ENCOUNTER — Telehealth: Payer: Self-pay

## 2020-07-28 NOTE — Telephone Encounter (Signed)
Can you make sure with patient that this is fine? Thanks

## 2020-07-28 NOTE — Telephone Encounter (Signed)
Spoke to Sc[place] at the Pharmacy and informed her that patient prefers to stay on the Brand Name of Relpax. Hassan Rowan stated that they will add a note in patients chart.

## 2020-07-28 NOTE — Telephone Encounter (Signed)
[facility] is requesting authorization to rewrite prescription Relpax 40 mg tablets as the generic. Per [facility] it is available in a cost saving generic alternative.

## 2020-07-28 NOTE — Telephone Encounter (Signed)
Called and spoke with patient. She informed me that she has tried the generic version of Relpax and it has not worked well with her. She prefers the brand name.

## 2020-07-28 NOTE — Telephone Encounter (Signed)
Pls let [facility] know that patient has tried generic and it is ineffective, dispense brand. Thanks

## 2020-08-09 IMAGING — MG DIGITAL DIAGNOSTIC UNILATERAL LEFT MAMMOGRAM WITH TOMO AND CAD
4 series · 4 of 12 positions shown · non-contrast
Comparison: 10/11/2018, 03/07/2015, 02/19/2014.

CLINICAL DATA: Recall from screening mammography with
tomosynthesis, possible asymmetry involving the UPPER LEFT breast at
ANTERIOR depth only visible on the MLO images.

EXAM:
DIGITAL DIAGNOSTIC UNILATERAL LEFT MAMMOGRAM WITH CAD AND TOMO

[L MLO synth-2D]
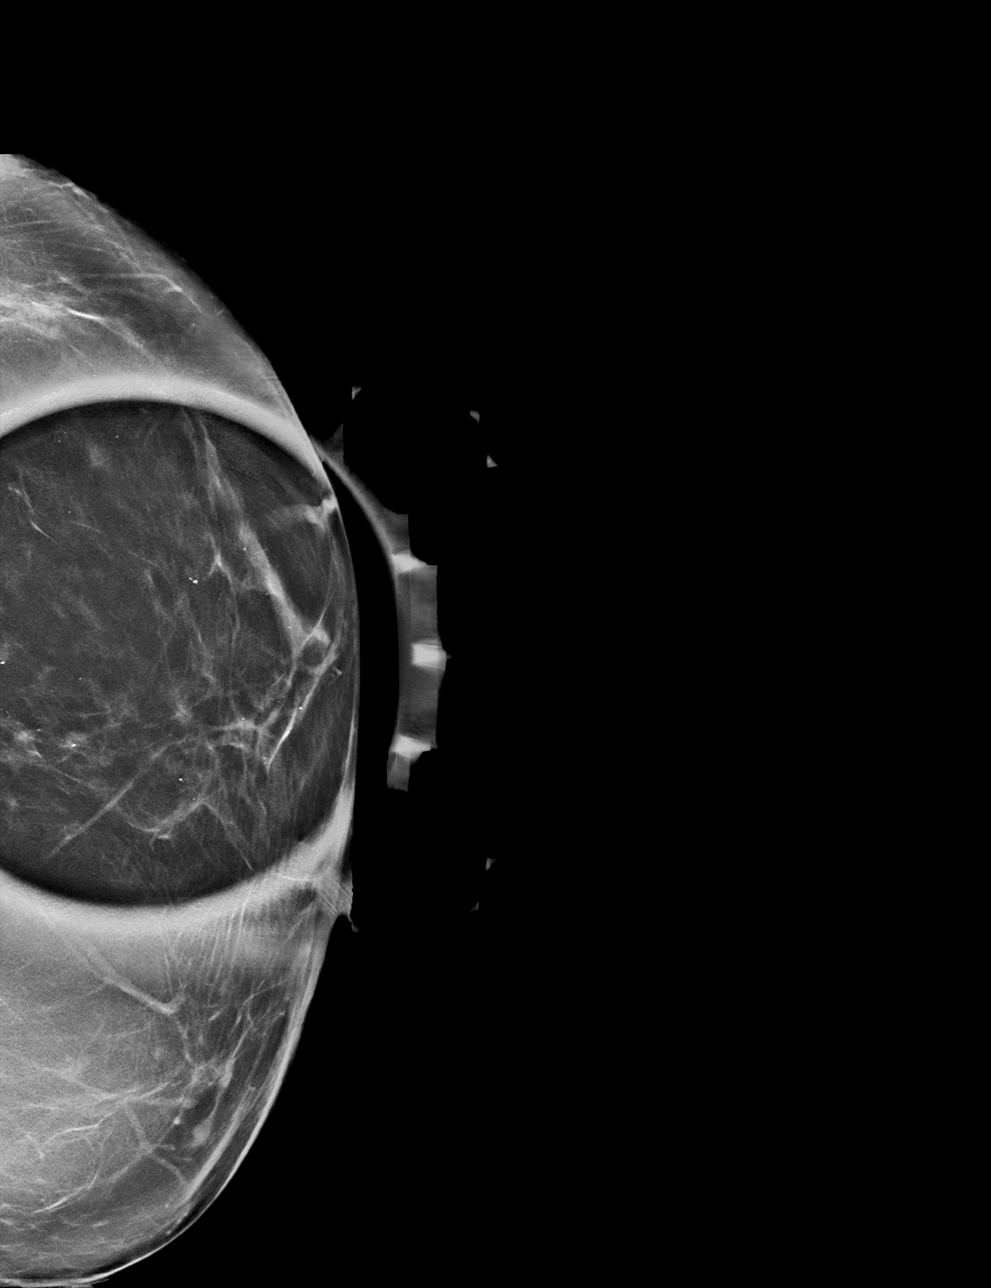

[L ML synth-2D]
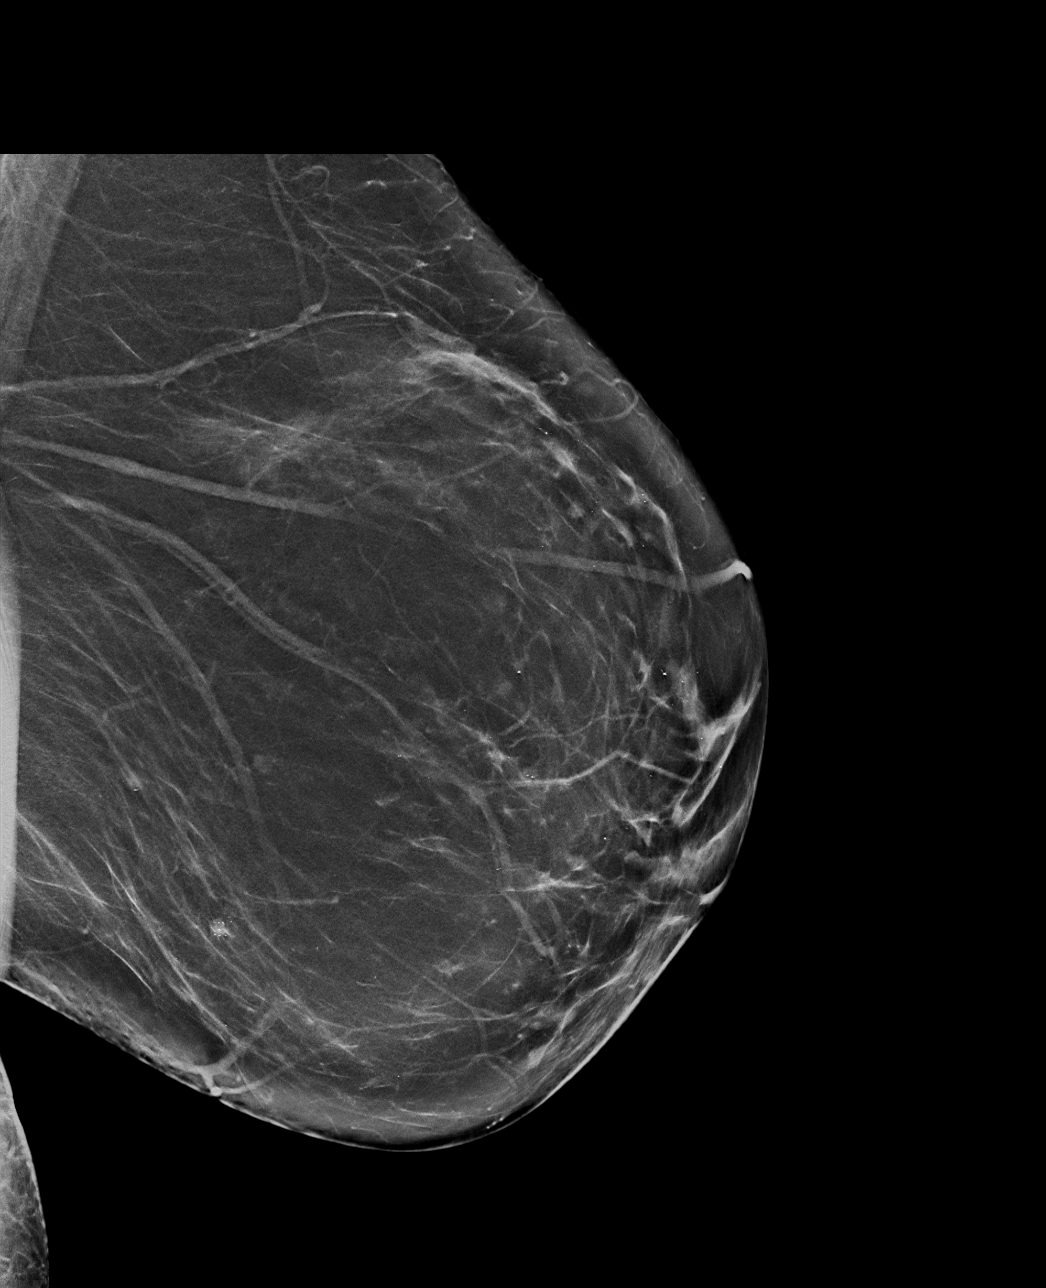

[L ML tomo · tomo slice 36/71.0]
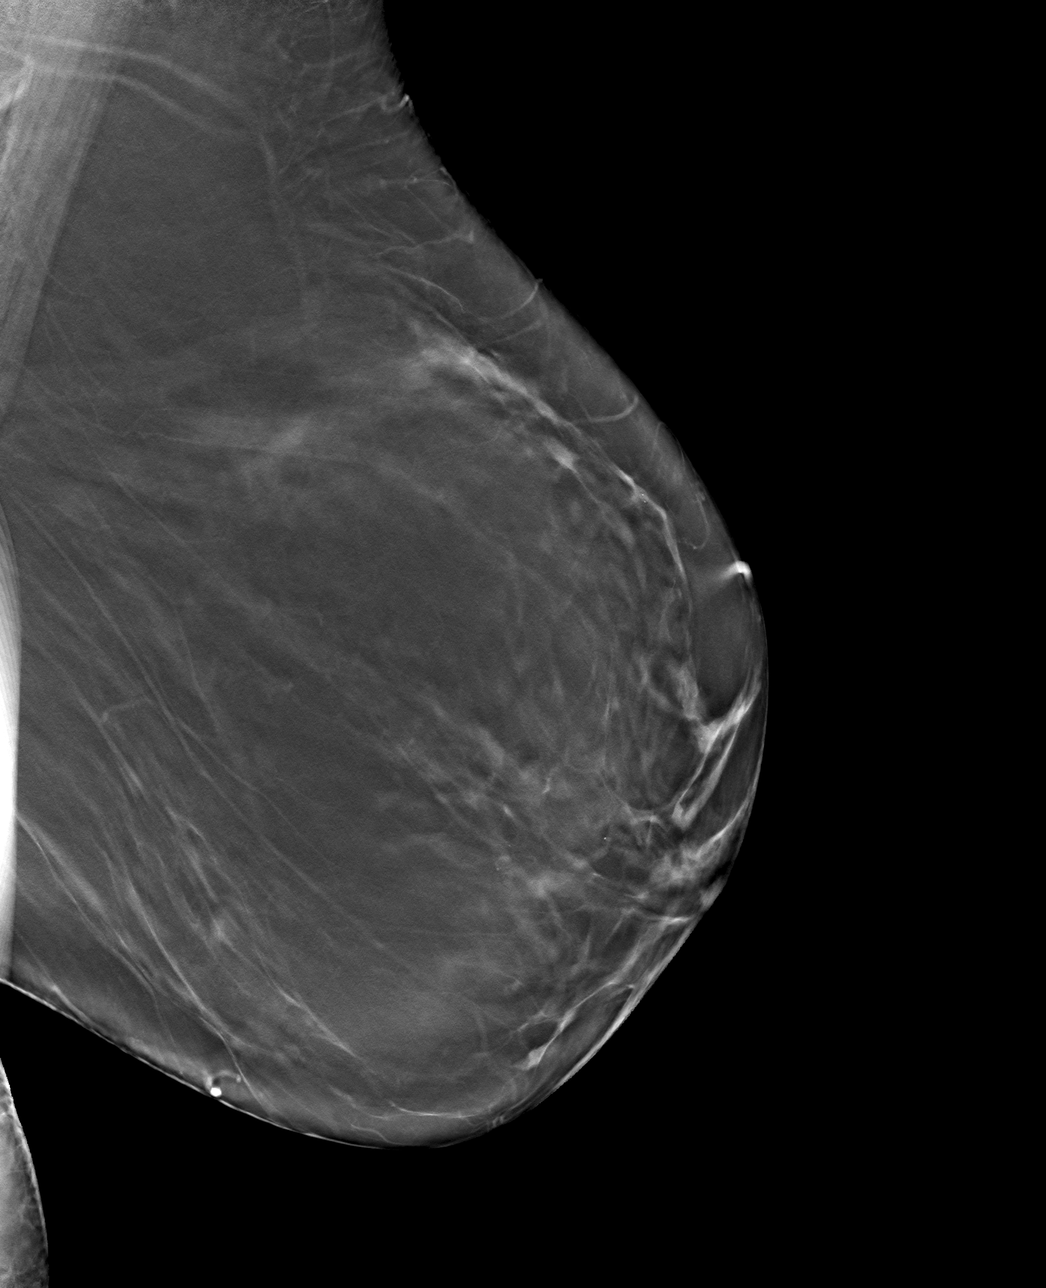

[L MLO tomo · tomo slice 30/59.0]
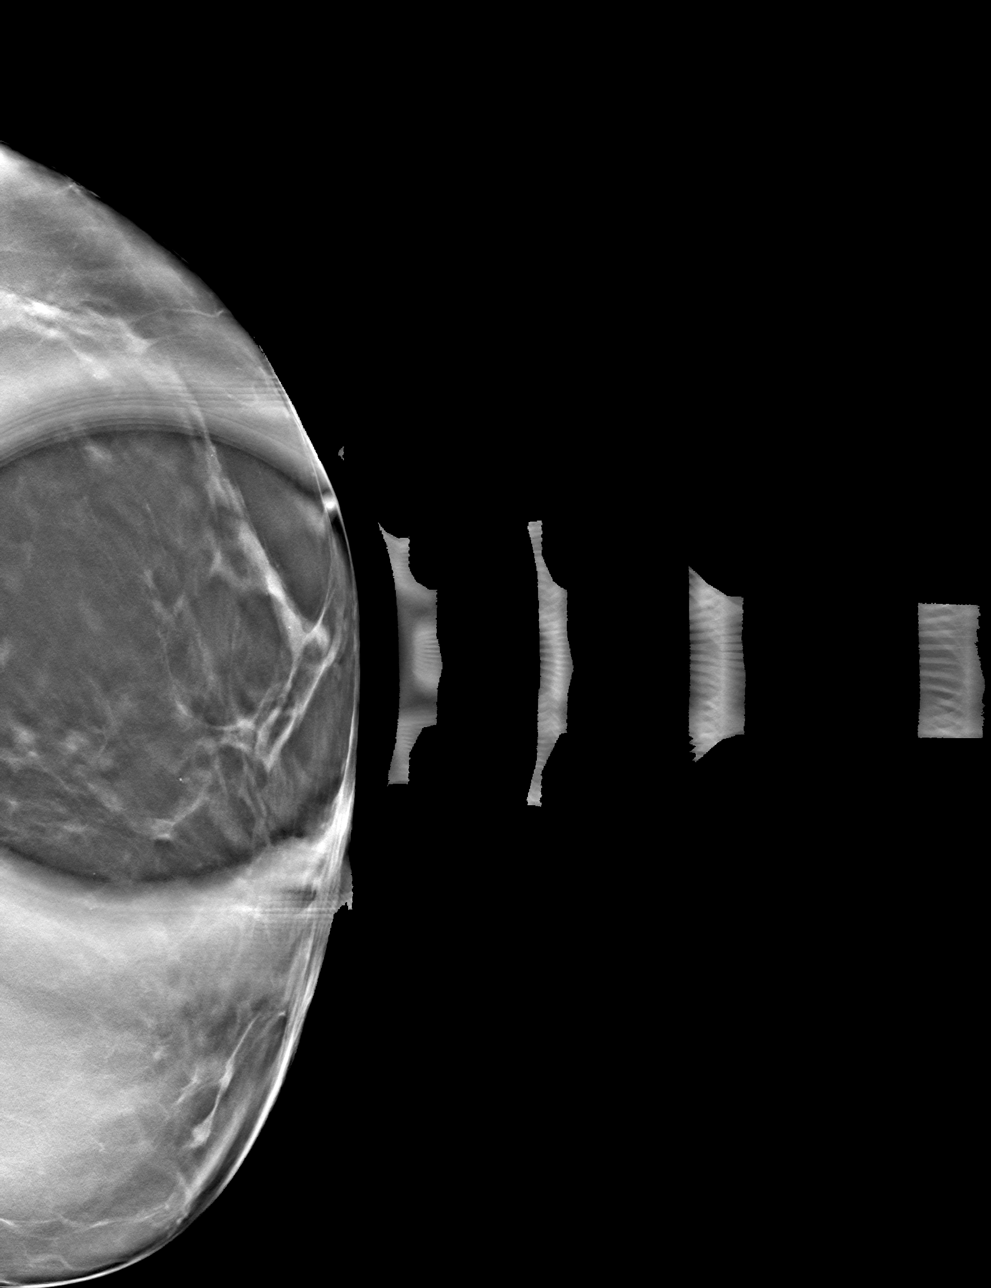

[4 of 12 positions shown; findings below may reference images not displayed]

ACR Breast Density Category b: There are scattered areas of
fibroglandular density.
FINDINGS: Tomosynthesis and synthesized spot compression MLO view of the area
of concern in the LEFT breast and a tomosynthesis and synthesized
full field mediolateral view of the LEFT breast were obtained.

The asymmetry in the UPPER LEFT breast at ANTERIOR depth questioned
on screening mammography disperses with compression and there is no
underlying mass or architectural distortion.

No findings suspicious for malignancy in the LEFT breast.

Mammographic images were processed with CAD.
IMPRESSION: 1. No mammographic evidence of malignancy involving the LEFT breast.
2. The asymmetry questioned on screening mammography corresponds to
overlapping normal fibroglandular tissue.

RECOMMENDATION:
Screening mammogram in one year.(Code:78-G-YPS)

I have discussed the findings and recommendations with the patient.
Results were also provided in writing at the conclusion of the
visit. If applicable, a reminder letter will be sent to the patient
regarding the next appointment.

BI-RADS CATEGORY  1: Negative.

## 2020-08-12 DIAGNOSIS — Z1211 Encounter for screening for malignant neoplasm of colon: Secondary | ICD-10-CM | POA: Diagnosis not present

## 2020-08-12 DIAGNOSIS — Z1212 Encounter for screening for malignant neoplasm of rectum: Secondary | ICD-10-CM | POA: Diagnosis not present

## 2020-08-12 LAB — COLOGUARD: Cologuard: NEGATIVE

## 2020-08-14 DIAGNOSIS — M25561 Pain in right knee: Secondary | ICD-10-CM | POA: Diagnosis not present

## 2020-08-14 DIAGNOSIS — M25551 Pain in right hip: Secondary | ICD-10-CM | POA: Diagnosis not present

## 2020-08-19 ENCOUNTER — Other Ambulatory Visit: Payer: Self-pay

## 2020-08-19 ENCOUNTER — Encounter (INDEPENDENT_AMBULATORY_CARE_PROVIDER_SITE_OTHER): Payer: Self-pay | Admitting: *Deleted

## 2020-08-19 ENCOUNTER — Encounter (INDEPENDENT_AMBULATORY_CARE_PROVIDER_SITE_OTHER): Payer: Self-pay | Admitting: Internal Medicine

## 2020-08-19 ENCOUNTER — Ambulatory Visit (INDEPENDENT_AMBULATORY_CARE_PROVIDER_SITE_OTHER): Payer: PPO | Admitting: Internal Medicine

## 2020-08-19 VITALS — BP 128/82 | HR 75 | Temp 98.4°F | Ht 62.0 in | Wt 175.3 lb

## 2020-08-19 DIAGNOSIS — B181 Chronic viral hepatitis B without delta-agent: Secondary | ICD-10-CM

## 2020-08-19 NOTE — Patient Instructions (Signed)
Physician will call with results of blood test and [place] and elastography

## 2020-08-19 NOTE — Progress Notes (Signed)
Presenting complaint;  Follow-up for chronic hepatitis B.  Database and subjective:  Patient is 69 year old Caucasian female who has history of chronic hepatitis B and is here for scheduled visit. She was last seen on 07/16/2020. She was diagnosed with hepatitis B back in 1993 when she had routine blood work to assess for thyroid disease. There is no prior history of icteric hepatitis. She states 2 out of 3 children tested positive for hepatitis B but her husband was negative. She was evaluated at [facility]. She had liver biopsy and was treated with interferon for several months but without water clearance. She has been having periodic HCV FibroSure tests. She had one in May 2017 while she was still living in [place] and suggested F1/F2 disease. She has been followed in our office since August 2020 and has been undergoing periodic labs. She had ultrasound in October 2019 which revealed heterogeneous echotexture with small echogenic nodular appearance which was further evaluated with MR in March 2020 and abnormality was felt to be benign vascular shunt in the right hepatic lobe. She had cyst in left hepatic lobe and there were no changes of cirrhosis. Patient had her blood test repeated after her visit 4 weeks ago and HBV DNA titer is going up.  Patient says she has a good appetite she denies abdominal pain pruritus fatigue or weakness. She says she has gained close to 40 pounds over the last 4 years. She has been having right hip pain. She is taking meloxicam with help. She joined [employer] and has already lost 6 pounds. She realized that she needs to change her lifestyle to stay healthy. She denies melena or rectal bleeding. Her bowels move regularly.  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   Current Medications: Outpatient Encounter Medications as of 08/19/2020  Medication Sig  . Clobetasol Prop Emollient Base  0.05 % emollient cream Apply topically 2 (two) times daily.  . meloxicam (MOBIC) 7.5 MG tablet Take 7.5 mg by mouth daily.  Marland Kitchen PROAIR HFA 108 (90 Base) MCG/ACT inhaler INHALE 2 PUFFS INTO THE LUNGS EVERY 6 HOURS AS NEEDED FOR WHEEZING OR SHORTNESS OF BREATH  . RELPAX 40 MG tablet TAKE 1 TABLET BY MOUTH AT ONSET OF MIGRAINE. DO NOT TAKE MORE THAN 3 A WEEK  . Rimegepant Sulfate (NURTEC) 75 MG TBDP Take 1 tablet by mouth as needed. Take 1 tablet as needed for migraine. Do not take more than 1 in a 24 hour period  . tretinoin (RETIN-A) 0.1 % cream Apply topically.  Marland Kitchen GLUCOSAMINE-CHONDROITIN PO Take by mouth. Patient states that she takes 1-2 per day. This medication includes Tumeric (Patient not taking: Reported on 08/19/2020)   No facility-administered encounter medications on file as of 08/19/2020.     Objective: Blood pressure 128/82, pulse 75, temperature 98.4 F (36.9 C), temperature source Oral, height _0  (1.575 m), weight 175 lb 4.8 oz (79.5 kg). Patient is alert and in no acute distress. She is wearing a mask. Conjunctiva is pink. Sclera is nonicteric Oropharyngeal mucosa is normal. No neck masses or thyromegaly noted. Cardiac exam with regular rhythm normal S1 and S2. No murmur or gallop noted. Lungs are clear to auscultation. Abdomen is full. On palpation is soft and nontender. No hepatosplenomegaly or masses. No LE edema or clubbing noted.  Labs/studies Results:  CBC Latest Ref Rng & Units 07/16/2020 08/11/2017 05/03/2017  WBC 3.8 - 10.8 Thousand/uL 3.9 4.8 4.3  Hemoglobin 11.7 - 15.5 g/dL 15.9(H) 15.0 14.8  Hematocrit 35 - 45 % 48.2(H) 44.5 44.2  Platelets 140 - 400 Thousand/uL 243 278 270    CMP Latest Ref Rng & Units 07/16/2020 01/30/2019 10/11/2018  Glucose 65 - 139 mg/dL 82 - -  BUN 7 - 25 mg/dL 21 - -  Creatinine 0.60 - 0.93 mg/dL 0.92 0.68 -  Sodium 135 - 146 mmol/L 141 - -  Potassium 3.5 - 5.3 mmol/L 4.3 - -  Chloride 98 - 110 mmol/L 103 - -  CO2 20 - 32 mmol/L 26 -  -  Calcium 8.6 - 10.4 mg/dL 9.0 - -  Total Protein 6.1 - 8.1 g/dL 6.8 6.8 6.7  Total Bilirubin 0.2 - 1.2 mg/dL 0.8 0.6 0.5  Alkaline Phos 33 - 130 U/L - - -  AST 10 - 35 U/L _0 ALT 6 - 29 U/L 32(H) 22 22    Hepatic Function Latest Ref Rng & Units 07/16/2020 01/30/2019 10/11/2018  Total Protein 6.1 - 8.1 g/dL 6.8 6.8 6.7  Albumin 3.6 - 5.1 g/dL - - -  AST 10 - 35 U/L _1 ALT 6 - 29 U/L 32(H) 22 22  Alk Phosphatase 33 - 130 U/L - - -  Total Bilirubin 0.2 - 1.2 mg/dL 0.8 0.6 0.5  Bilirubin, Direct 0.0 - 0.2 mg/dL - 0.1 0.1    Lab data from 07/16/2020 INR 1.1 Alpha-fetoprotein 4.1  Lab data from 07/22/2020  HBV delta antigen negative HBV e antigen negative. HBV e antibody reactive HBV DNA by PCR 5400 IU/mL  HBV DNA by PCR was 1260 IU/mL on 01/30/2019 HBV DNA by PCR was 219 IU/mL on 10/11/2018.    Assessment:  #1. Patient is 69 year old Caucasian female who has chronic hepatitis B which was diagnosed back in 1993 when she was evaluated at [facility] and treated with interferon for several months but did not respond. She has been free of symptoms. She does not have stigmata of chronic liver disease but HBV DNA load is rising. ALT is mildly elevated. She therefore needs to be reevaluated as well as disease activity and stage is concerned and determine if she would benefit from antibiotic therapy with polymerase inhibitors or nucleotide reverse transcriptase inhibitors.  #2. Patient's hemoglobin is at upper limit of normal. It will be repeated in few months.   Plan:  Patient will go to the lab for hepatitis B surface antigen. Abdominal ultrasound with elastography. If ultrasound shows unequivocal changes of cirrhosis we will proceed with antiviral therapy with HBV DNA polymerase and a better and if ultrasound does not show changes of cirrhosis would consider liver biopsy to determine disease activity before considering antibiotic therapy. Office visit in 6  months.

## 2020-08-20 ENCOUNTER — Telehealth (INDEPENDENT_AMBULATORY_CARE_PROVIDER_SITE_OTHER): Payer: Self-pay | Admitting: Gastroenterology

## 2020-08-20 LAB — COLOGUARD: COLOGUARD: NEGATIVE

## 2020-08-20 NOTE — Telephone Encounter (Signed)
Called and notified pt of  results of cologuard, pt understood results with any concerns.

## 2020-08-20 NOTE — Telephone Encounter (Signed)
Please notify patient that her Cologuard test was negative.  I will have the results scanned to chart.  Thanks

## 2020-08-22 ENCOUNTER — Encounter (INDEPENDENT_AMBULATORY_CARE_PROVIDER_SITE_OTHER): Payer: Self-pay

## 2020-08-26 DIAGNOSIS — B181 Chronic viral hepatitis B without delta-agent: Secondary | ICD-10-CM | POA: Diagnosis not present

## 2020-08-27 ENCOUNTER — Ambulatory Visit (HOSPITAL_COMMUNITY)
Admission: RE | Admit: 2020-08-27 | Discharge: 2020-08-27 | Disposition: A | Discharge status: Home / Self Care | Payer: PPO | Source: Ambulatory Visit | Attending: Internal Medicine | Admitting: Internal Medicine

## 2020-08-27 ENCOUNTER — Other Ambulatory Visit: Payer: Self-pay

## 2020-08-27 DIAGNOSIS — B181 Chronic viral hepatitis B without delta-agent: Secondary | ICD-10-CM | POA: Diagnosis not present

## 2020-08-27 DIAGNOSIS — N1330 Unspecified hydronephrosis: Secondary | ICD-10-CM | POA: Diagnosis not present

## 2020-08-27 DIAGNOSIS — K760 Fatty (change of) liver, not elsewhere classified: Secondary | ICD-10-CM | POA: Diagnosis not present

## 2020-08-27 DIAGNOSIS — I10 Essential (primary) hypertension: Secondary | ICD-10-CM | POA: Diagnosis not present

## 2020-08-27 LAB — HEPATITIS B SURFACE ANTIGEN: Hepatitis B Surface Ag: REACTIVE — AB

## 2020-09-02 ENCOUNTER — Other Ambulatory Visit (INDEPENDENT_AMBULATORY_CARE_PROVIDER_SITE_OTHER): Payer: Self-pay | Admitting: *Deleted

## 2020-09-02 DIAGNOSIS — M25551 Pain in right hip: Secondary | ICD-10-CM | POA: Diagnosis not present

## 2020-09-02 DIAGNOSIS — B181 Chronic viral hepatitis B without delta-agent: Secondary | ICD-10-CM

## 2020-09-02 DIAGNOSIS — B1910 Unspecified viral hepatitis B without hepatic coma: Secondary | ICD-10-CM

## 2020-09-02 DIAGNOSIS — R935 Abnormal findings on diagnostic imaging of other abdominal regions, including retroperitoneum: Secondary | ICD-10-CM

## 2020-09-02 DIAGNOSIS — K769 Liver disease, unspecified: Secondary | ICD-10-CM

## 2020-09-02 DIAGNOSIS — M545 Low back pain: Secondary | ICD-10-CM | POA: Diagnosis not present

## 2020-09-04 ENCOUNTER — Ambulatory Visit: Payer: PPO

## 2020-09-11 ENCOUNTER — Ambulatory Visit: Payer: PPO

## 2020-09-18 ENCOUNTER — Ambulatory Visit: Payer: PPO

## 2020-09-19 DIAGNOSIS — M5416 Radiculopathy, lumbar region: Secondary | ICD-10-CM | POA: Diagnosis not present

## 2020-09-19 DIAGNOSIS — M25551 Pain in right hip: Secondary | ICD-10-CM | POA: Diagnosis not present

## 2020-09-25 ENCOUNTER — Ambulatory Visit: Payer: PPO

## 2020-09-25 DIAGNOSIS — M545 Low back pain, unspecified: Secondary | ICD-10-CM | POA: Diagnosis not present

## 2020-09-25 DIAGNOSIS — M25551 Pain in right hip: Secondary | ICD-10-CM | POA: Diagnosis not present

## 2020-09-30 DIAGNOSIS — L659 Nonscarring hair loss, unspecified: Secondary | ICD-10-CM | POA: Insufficient documentation

## 2020-10-08 DIAGNOSIS — Z23 Encounter for immunization: Secondary | ICD-10-CM | POA: Diagnosis not present

## 2020-10-13 ENCOUNTER — Encounter (HOSPITAL_COMMUNITY): Payer: Self-pay | Admitting: Physical Therapy

## 2020-10-13 ENCOUNTER — Ambulatory Visit (HOSPITAL_COMMUNITY): Payer: PPO | Attending: Sports Medicine | Admitting: Physical Therapy

## 2020-10-13 ENCOUNTER — Other Ambulatory Visit: Payer: Self-pay

## 2020-10-13 DIAGNOSIS — M545 Low back pain, unspecified: Secondary | ICD-10-CM

## 2020-10-13 DIAGNOSIS — M25551 Pain in right hip: Secondary | ICD-10-CM | POA: Insufficient documentation

## 2020-10-13 DIAGNOSIS — R29898 Other symptoms and signs involving the musculoskeletal system: Secondary | ICD-10-CM | POA: Diagnosis not present

## 2020-10-13 DIAGNOSIS — R2689 Other abnormalities of gait and mobility: Secondary | ICD-10-CM

## 2020-10-13 DIAGNOSIS — M6281 Muscle weakness (generalized): Secondary | ICD-10-CM | POA: Diagnosis not present

## 2020-10-13 NOTE — Therapy (Signed)
[facility] [place], [place], [address] Phone: [phone]   Fax:  [phone]  Physical Therapy Evaluation  Patient Details  Name: Alyssa Durham MRN: [id number] Date of Birth: 11-18-1951 Referring Provider (PT): Vickki Hearing MD   Encounter Date: 10/13/2020   PT End of Session - 10/13/20 1221    Visit Number 1    Number of Visits 12    Date for PT Re-Evaluation 11/24/20    Authorization Type [employer] (no vl no auth)    PT Start Time 1132    PT Stop Time 1217    PT Time Calculation (min) 45 min    Activity Tolerance Patient tolerated treatment well;Patient limited by pain    Behavior During Therapy [facility] for tasks assessed/performed           Past Medical History:  Diagnosis Date  . Allergy   . Anxiety and depression   . Arthritis   . Asthma   . Cataract   . Chronic active type B viral hepatitis ([place])   . Depression   . HLD (hyperlipidemia) 05/12/2017  . Migraines   . Osteoporosis     Past Surgical History:  Procedure Laterality Date  . CESAREAN SECTION    . URETHRAL DILATION    . WISDOM TOOTH EXTRACTION      There were no vitals filed for this visit.    Subjective Assessment - 10/13/20 1141    Subjective Patient is 69 y.o. female who presents to physical therapy with c/o LBP and R hip pain. Patient states her pain started in [place] with insidious onset. She got her MD to send her exercises and she did some of those and they caused a lot of pain. She is on tramadol now and has been very limited and has been taking for about 3 weeks. She is in a lot more pain since doing the exercises. She did clamshell, piriformis stretch and standing quad stretch which caused a lot pain. She had an injection in her hip several months ago which helped for a week. She has stopped walking because it caused more pain in her back with that. She states ligament tear in her hip. She is having pain in lower right back  and R lateral and anterior hip. She has been taking tramadol frequently and an anti inflammatory. Patient states her main goal for physical therapy to get out of pain.    Limitations House hold activities;Standing;Walking;Lifting    How long can you walk comfortably? unable    Patient Stated Goals to get out of pain    Currently in Pain? Yes    Pain Location Hip    Pain Orientation Right    Pain Descriptors / Indicators Dull    Pain Type Chronic pain    Pain Onset More than a month ago    Pain Frequency Constant              [facility] PT Assessment - 10/13/20 0001      Assessment   Medical Diagnosis LBP, R hip pain    Referring Provider (PT) Vickki Hearing MD    Onset Date/Surgical Date 06/13/20    Next MD Visit none    Prior Therapy Yes shoulder      Precautions   Precautions None      Restrictions   Weight Bearing Restrictions No      Balance Screen   Has the patient fallen in the past 6 months  No    Has the patient had a decrease in activity level because of a fear of falling?  No    Is the patient reluctant to leave their home because of a fear of falling?  No      Prior Function   Level of Independence Independent    Vocation Retired      Charity fundraiser Status Within Functional Limits for tasks assessed      Observation/Other Assessments   Observations Ambulates without AD    Focus on Therapeutic Outcomes (FOTO)  54% limited      ROM / Strength   AROM / PROM / Strength AROM;Strength      AROM   AROM Assessment Site Lumbar;Hip    Lumbar Flexion 0% limited, pain in R glute    Lumbar Extension 0% limited    Lumbar - Right Side Bend 0% limited    Lumbar - Left Side Bend 0% limited    Lumbar - Right Rotation 0% limited    Lumbar - Left Rotation 0% limited      Strength   Strength Assessment Site Hip;Knee;Ankle    Right/Left Hip Right;Left    Right Hip Flexion 3-/5    Right Hip Extension 4-/5    Left Hip Flexion 4+/5    Left Hip ABduction 3+/5    right hip pain   Right/Left Knee Right;Left    Right Knee Flexion 4/5    Right Knee Extension 4+/5    Left Knee Flexion 5/5    Left Knee Extension 5/5    Right/Left Ankle Right;Left    Right Ankle Dorsiflexion 5/5    Left Ankle Dorsiflexion 5/5      Palpation   Spinal mobility grossly hypomobile and tender    Palpation comment TTP bilateral glutes with greater tenderness on R, R piriformis; R glutes max, med/min concordant      Ambulation/Gait   Ambulation/Gait Yes    Ambulation/Gait Assistance 6: Modified independent (Device/Increase time)    Ambulation Distance (Feet) 330 Feet    Assistive device None    Gait Pattern Trendelenburg    Ambulation Surface Level;Indoor    Gait velocity decreased    Gait Comments 2 MWT, decreased hip ext on R                      Objective measurements completed on examination: See above findings.               PT Education - 10/13/20 1128    Education Details Patient educated on exam findings, POC, scope of PT    Person(s) Educated Patient    Methods Explanation;Demonstration    Comprehension Verbalized understanding;Returned demonstration            PT Short Term Goals - 10/13/20 1227      PT SHORT TERM GOAL #1   Title Patient will be independent with HEP in order to improve functional outcomes.    Time 3    Period Weeks    Status New    Target Date 11/03/20      PT SHORT TERM GOAL #2   Title Patient will report at least 25% improvement in symptoms for improved quality of life.    Time 3    Period Weeks    Status New    Target Date 11/03/20             PT Long Term Goals - 10/13/20 1228  PT LONG TERM GOAL #1   Title Patient will report at least 75% improvement in symptoms for improved quality of life.    Time 6    Period Weeks    Status New    Target Date 11/24/20      PT LONG TERM GOAL #2   Title Patient will improve FOTO score by at least 10 points in order to indicate improved  tolerance to activity.    Time 6    Period Weeks    Status New    Target Date 11/24/20      PT LONG TERM GOAL #3   Title Patient will be able to ambulate at least 425 feet in 2MWT in order to demonstrate improved gait speed for community ambulation.    Time 6    Period Weeks    Status New    Target Date 11/24/20      PT LONG TERM GOAL #4   Title Patient will be able to ambulate for at least 10 minutes with pain no greater than 1/10 in order to demonstrate improved ability to ambulate in the community.    Time 6    Period Weeks    Status New    Target Date 11/24/20                  Plan - 10/13/20 1223    Clinical Impression Statement Patient is 69 y.o. female who presents to physical therapy with c/o LBP and R hip pain.  She presents with pain limited deficits in R hip strength, ROM, endurance, gait, hyperactive and tender musculature, and functional mobility with ADL. She is having to modify and restrict ADL as indicated by FOTO score as well as subjective information and objective measures which is affecting overall participation. Patient will benefit from skilled physical therapy in order to improve function and reduce impairment.    Personal Factors and Comorbidities Age;Behavior Pattern;Comorbidity 3+;Time since onset of injury/illness/exacerbation;Fitness;Past/Current Experience    Comorbidities Arthritis, Asthma, Back pain, BMI , liver Disease, Headaches,Hepatitis    Examination-Activity Limitations Bend;Bed Mobility;Carry;Lift;Locomotion Level;Sleep;Squat;Stairs;Stand;Transfers    Examination-Participation Restrictions Cleaning;Church;Community Activity;Volunteer;Yard Work;Laundry;Shop    Stability/Clinical Decision Making Evolving/Moderate complexity    Clinical Decision Making Moderate    Rehab Potential Fair    PT Duration 6 weeks    PT Treatment/Interventions ADLs/Self Care Home Management;Aquatic Therapy;Canalith Repostioning;Cryotherapy;Electrical  Stimulation;Iontophoresis 4mg /ml Dexamethasone;Moist Heat;Traction;Ultrasound;DME Instruction;Gait training;Stair training;Functional mobility training;Therapeutic activities;Therapeutic exercise;Balance training;Orthotic Fit/Training;Patient/family education;Neuromuscular re-education;Manual techniques;Dry needling;Passive range of motion;Energy conservation;Splinting;Taping;Spinal Manipulations;Joint Manipulations    PT Next Visit Plan Begin gentle stretches for hip, begin hip strengthening and progress to functional hip strengthing as able. possilby core strength, manual STM to glutes for pain possibly    PT Home Exercise Plan initiate  next session    Consulted and Agree with Plan of Care Patient           Patient will benefit from skilled therapeutic intervention in order to improve the following deficits and impairments:  Abnormal gait, Decreased range of motion, Difficulty walking, Decreased endurance, Increased muscle spasms, Decreased activity tolerance, Decreased balance, Pain, Decreased mobility, Decreased strength, Improper body mechanics, Impaired flexibility, Hypomobility  Visit Diagnosis: Pain in right hip  Low back pain, unspecified back pain laterality, unspecified chronicity, unspecified whether sciatica present  Muscle weakness (generalized)  Other abnormalities of gait and mobility  Other symptoms and signs involving the musculoskeletal system     Problem List Patient Active Problem List   Diagnosis Date Noted  . Pain in  joint of right shoulder 06/29/2018  . History of HPV infection 11/15/2017  . HLD (hyperlipidemia) 05/12/2017  . History of non anemic vitamin B12 deficiency 05/12/2017  . RBBB 05/12/2017  . Calcification of aorta (HCC) 04/04/2017  . Anxiety and depression 03/29/2017  . Chronic hepatitis B ([place]) 03/29/2017  . Lichen sclerosus of female genitalia 03/29/2017  . Osteoporosis 03/29/2017  . Chronic migraine 08/16/2016    12:31 PM,  10/13/20 Mearl Latin PT, DPT Physical Therapist at [facility] [place], [place], [address] Phone: [phone]   Fax:  [phone]  Name: Alyssa Durham MRN: [id number] Date of Birth: 1951-05-14

## 2020-10-14 ENCOUNTER — Ambulatory Visit (HOSPITAL_COMMUNITY): Payer: PPO | Admitting: Physical Therapy

## 2020-10-15 ENCOUNTER — Encounter (HOSPITAL_COMMUNITY): Payer: Self-pay | Admitting: Physical Therapy

## 2020-10-15 ENCOUNTER — Ambulatory Visit (HOSPITAL_COMMUNITY): Payer: PPO | Admitting: Physical Therapy

## 2020-10-15 ENCOUNTER — Other Ambulatory Visit: Payer: Self-pay

## 2020-10-15 DIAGNOSIS — M6281 Muscle weakness (generalized): Secondary | ICD-10-CM

## 2020-10-15 DIAGNOSIS — M25551 Pain in right hip: Secondary | ICD-10-CM

## 2020-10-15 DIAGNOSIS — M545 Low back pain, unspecified: Secondary | ICD-10-CM

## 2020-10-15 NOTE — Therapy (Addendum)
[facility] [address] [place], [place], [address] Phone: [phone]   Fax:  [phone]  Physical Therapy Treatment and Discharge Note  Patient Details  Name: Alyssa Durham MRN: [id number] Date of Birth: 1951-09-07 Referring Provider (PT): Vickki Hearing MD   PHYSICAL THERAPY DISCHARGE SUMMARY  Visits from Start of Care: 2  Current functional level related to goals / functional outcomes: Unable to assess due to unplanned discharge   Remaining deficits: Unable to assess due to unplanned discharge   Education / Equipment: Unable to assess due to unplanned discharge Plan: Patient agrees to discharge.  Patient goals were not met. Patient is being discharged due to financial reasons.  ?????         10:18 AM, 01/20/21 Jerene Pitch, DPT Physical Therapy with [facility]  [phone] office      Encounter Date: 10/15/2020   PT End of Session - 10/15/20 1130    Visit Number 2    Number of Visits 12    Date for PT Re-Evaluation 11/24/20    Authorization Type [employer] (no vl no auth)    PT Start Time 1130    PT Stop Time 1210    PT Time Calculation (min) 40 min    Activity Tolerance Patient tolerated treatment well;Patient limited by pain    Behavior During Therapy [facility] for tasks assessed/performed           Past Medical History:  Diagnosis Date  . Allergy   . Anxiety and depression   . Arthritis   . Asthma   . Cataract   . Chronic active type B viral hepatitis ([place])   . Depression   . HLD (hyperlipidemia) 05/12/2017  . Migraines   . Osteoporosis     Past Surgical History:  Procedure Laterality Date  . CESAREAN SECTION    . URETHRAL DILATION    . WISDOM TOOTH EXTRACTION      There were no vitals filed for this visit.   Subjective Assessment - 10/15/20 1135    Subjective States she has been having pain in her right hip. She is doing alright today as she hasn't had  to taken tramadol yet today. States that her most recent injection (occured in sept) increased her pain. States that she is scehduled to get a PRP injection on 11/16 and she has to stop PT for a certain amount of time before and after.    Limitations House hold activities;Standing;Walking;Lifting    How long can you walk comfortably? unable    Patient Stated Goals to get out of pain    Currently in Pain? Yes    Pain Score 4     Pain Location Hip    Pain Orientation Right    Pain Descriptors / Indicators Dull    Pain Onset More than a month ago              [facility] PT Assessment - 10/15/20 0001      Assessment   Medical Diagnosis LBP, R hip pain    Referring Provider (PT) Vickki Hearing MD    Onset Date/Surgical Date 06/13/20      Observation/Other Assessments-Edema    Edema Circumferential      Circumferential Edema   Circumferential - Right 27.0 cm   2 inches above ankle   Circumferential - Left  25.5cm   2 inches above ankle  [facility] PT Treatment/Exercise - 10/15/20 0001      Exercises   Exercises Knee/Hip      Knee/Hip Exercises: Seated   Other Seated Knee/Hip Exercises hip abd in seated position - 2x10 2" holds; hip add isometric with pillow in seated position - not tolerated well       Knee/Hip Exercises: Supine   Other Supine Knee/Hip Exercises hook lying hip abd - x2 - increase pain on right - stopped - hip add iso x3 5" holds on towel - increase in pain     Other Supine Knee/Hip Exercises legs straight R hip IR/ER 2x10 5" holds In each position       Modalities   Modalities Moist Heat      Moist Heat Therapy   Number Minutes Moist Heat 15 Minutes    Moist Heat Location Hip   right,  during supine interventions      Manual Therapy   Manual Therapy Soft tissue mobilization    Manual therapy comments Completed separately from therapeutic exercises     Soft tissue mobilization with percussion gun and towel between- not  tolerated well - jump response noted with it; STM to right glute med, hip flexors and obliques - tolerated better- sore throughout right lateral hip and tight along front of thigh                   PT Education - 10/15/20 1212    Education Details on swellinging in right leg, on benefits of compression garments. on pain and taking pain meds as prescribed  (when in pain)    Person(s) Educated Patient    Methods Explanation    Comprehension Verbalized understanding            PT Short Term Goals - 10/13/20 1227      PT SHORT TERM GOAL #1   Title Patient will be independent with HEP in order to improve functional outcomes.    Time 3    Period Weeks    Status New    Target Date 11/03/20      PT SHORT TERM GOAL #2   Title Patient will report at least 25% improvement in symptoms for improved quality of life.    Time 3    Period Weeks    Status New    Target Date 11/03/20             PT Long Term Goals - 10/13/20 1228      PT LONG TERM GOAL #1   Title Patient will report at least 75% improvement in symptoms for improved quality of life.    Time 6    Period Weeks    Status New    Target Date 11/24/20      PT LONG TERM GOAL #2   Title Patient will improve FOTO score by at least 10 points in order to indicate improved tolerance to activity.    Time 6    Period Weeks    Status New    Target Date 11/24/20      PT LONG TERM GOAL #3   Title Patient will be able to ambulate at least 425 feet in 2MWT in order to demonstrate improved gait speed for community ambulation.    Time 6    Period Weeks    Status New    Target Date 11/24/20      PT LONG TERM GOAL #4   Title Patient will be able to ambulate for at  least 10 minutes with pain no greater than 1/10 in order to demonstrate improved ability to ambulate in the community.    Time 6    Period Weeks    Status New    Target Date 11/24/20                 Plan - 10/15/20 1205    Clinical Impression  Statement Increased pain noted with hip ER with long lever (legs extended). Poor tolerance to today's interventions. Pain started with first exercises and then continued throughout session with each exercise. Cued for rest breaks as needed secondary to pain. Will continue to trial isometrics and mobility exercises as tolerated. Reported 7/10 pain end of session at the lateral side of her right sacrum.    Personal Factors and Comorbidities Age;Behavior Pattern;Comorbidity 3+;Time since onset of injury/illness/exacerbation;Fitness;Past/Current Experience    Comorbidities Arthritis, Asthma, Back pain, BMI , liver Disease, Headaches,Hepatitis    Examination-Activity Limitations Bend;Bed Mobility;Carry;Lift;Locomotion Level;Sleep;Squat;Stairs;Stand;Transfers    Examination-Participation Restrictions Cleaning;Church;Community Activity;Volunteer;Yard Work;Laundry;Shop    Stability/Clinical Decision Making Evolving/Moderate complexity    Rehab Potential Fair    PT Duration 6 weeks    PT Treatment/Interventions ADLs/Self Care Home Management;Aquatic Therapy;Canalith Repostioning;Cryotherapy;Electrical Stimulation;Iontophoresis 80m/ml Dexamethasone;Moist Heat;Traction;Ultrasound;DME Instruction;Gait training;Stair training;Functional mobility training;Therapeutic activities;Therapeutic exercise;Balance training;Orthotic Fit/Training;Patient/family education;Neuromuscular re-education;Manual techniques;Dry needling;Passive range of motion;Energy conservation;Splinting;Taping;Spinal Manipulations;Joint Manipulations    PT Next Visit Plan Begin gentle stretches for hip, begin hip strengthening and progress to functional hip strengthing as able. possilby core strength, manual STM to glutes for pain possibly    PT Home Exercise Plan seated hip abd    Consulted and Agree with Plan of Care Patient           Patient will benefit from skilled therapeutic intervention in order to improve the following deficits and  impairments:  Abnormal gait, Decreased range of motion, Difficulty walking, Decreased endurance, Increased muscle spasms, Decreased activity tolerance, Decreased balance, Pain, Decreased mobility, Decreased strength, Improper body mechanics, Impaired flexibility, Hypomobility  Visit Diagnosis: Pain in right hip  Low back pain, unspecified back pain laterality, unspecified chronicity, unspecified whether sciatica present  Muscle weakness (generalized)     Problem List Patient Active Problem List   Diagnosis Date Noted  . Pain in joint of right shoulder 06/29/2018  . History of HPV infection 11/15/2017  . HLD (hyperlipidemia) 05/12/2017  . History of non anemic vitamin B12 deficiency 05/12/2017  . RBBB 05/12/2017  . Calcification of aorta (HCC) 04/04/2017  . Anxiety and depression 03/29/2017  . Chronic hepatitis B ([place] 03/29/2017  . Lichen sclerosus of female genitalia 03/29/2017  . Osteoporosis 03/29/2017  . Chronic migraine 08/16/2016   12:12 PM, 10/15/20 MJerene Pitch DPT [facility] [phone]  [place] [address]Phone: [phone]  Fax:  [phone] Name: Tameaka SZakiyah DiopMRN: [id number] of Birth: 108-14-1952

## 2020-10-16 ENCOUNTER — Telehealth (HOSPITAL_COMMUNITY): Payer: Self-pay | Admitting: Physical Therapy

## 2020-10-16 ENCOUNTER — Ambulatory Visit: Payer: PPO | Attending: Internal Medicine

## 2020-10-16 DIAGNOSIS — Z23 Encounter for immunization: Secondary | ICD-10-CM

## 2020-10-16 NOTE — Progress Notes (Signed)
   Covid-19 Vaccination Clinic  Name:  Kirstie Azeneth Carbonell    MRN: [id number] DOB: 24-Mar-1951  10/16/2020  Ms. Hudson was observed post Covid-19 immunization for 15 minutes without incident. She was provided with Vaccine Information Sheet and instruction to access the V-Safe system.   Ms. Cervantes was instructed to call 911 with any severe reactions post vaccine: Marland Kitchen Difficulty breathing  . Swelling of face and throat  . A fast heartbeat  . A bad rash all over body  . Dizziness and weakness

## 2020-10-16 NOTE — Telephone Encounter (Signed)
pt called to cx all of her therapy appts due to the md orders. She will be getting PRP injections instead. Will need to be discharged

## 2020-10-21 ENCOUNTER — Ambulatory Visit (HOSPITAL_COMMUNITY): Payer: PPO | Admitting: Physical Therapy

## 2020-10-28 ENCOUNTER — Encounter (HOSPITAL_COMMUNITY): Payer: PPO | Admitting: Physical Therapy

## 2020-10-28 DIAGNOSIS — M5136 Other intervertebral disc degeneration, lumbar region: Secondary | ICD-10-CM | POA: Diagnosis not present

## 2020-10-28 DIAGNOSIS — M51369 Other intervertebral disc degeneration, lumbar region without mention of lumbar back pain or lower extremity pain: Secondary | ICD-10-CM | POA: Insufficient documentation

## 2020-10-30 ENCOUNTER — Encounter (HOSPITAL_COMMUNITY): Payer: PPO | Admitting: Physical Therapy

## 2020-11-04 ENCOUNTER — Encounter (HOSPITAL_COMMUNITY): Payer: PPO | Admitting: Physical Therapy

## 2020-11-06 ENCOUNTER — Encounter (HOSPITAL_COMMUNITY): Payer: PPO | Admitting: Physical Therapy

## 2020-11-10 ENCOUNTER — Encounter (HOSPITAL_COMMUNITY): Payer: PPO | Admitting: Physical Therapy

## 2020-11-11 ENCOUNTER — Encounter (HOSPITAL_COMMUNITY): Payer: PPO | Admitting: Physical Therapy

## 2020-11-18 ENCOUNTER — Encounter (HOSPITAL_COMMUNITY): Payer: PPO | Admitting: Physical Therapy

## 2020-11-20 ENCOUNTER — Encounter (HOSPITAL_COMMUNITY): Payer: PPO | Admitting: Physical Therapy

## 2020-11-25 ENCOUNTER — Encounter (HOSPITAL_COMMUNITY): Payer: PPO | Admitting: Physical Therapy

## 2020-11-27 ENCOUNTER — Encounter (HOSPITAL_COMMUNITY): Payer: PPO | Admitting: Physical Therapy

## 2020-12-04 DIAGNOSIS — M25551 Pain in right hip: Secondary | ICD-10-CM | POA: Diagnosis not present

## 2020-12-15 ENCOUNTER — Other Ambulatory Visit: Payer: Self-pay | Admitting: Neurology

## 2020-12-16 ENCOUNTER — Ambulatory Visit: Payer: PPO | Admitting: Neurology

## 2020-12-17 NOTE — Progress Notes (Signed)
Office Visit Note  Patient: Alyssa Durham             Date of Birth: 1951-12-09           MRN: [id number]             PCP: Anabel Halon, MD Referring: Benita Stabile, MD Visit Date: 12/30/2020 Occupation: @GUAROCC @  Subjective:  Knee pain.   History of Present Illness: Alyssa Durham is a 69 y.o. female seen in consultation per request of PCP for evaluation of positive rheumatoid factor.  Patient states while she was living in [place] she had a rheumatoid factor of greater than 800.  At the time she was seen by rheumatologist and all autoimmune work-up was negative.  She states she moved here from [place] in  2017.  She had repeat rheumatoid factor which was 795 and then she had rheumatoid factor again repeated which was 135.  She states in her 98s she started having some pain and stiffness in her hands mostly in the DIPs of her fingers.  Eventually the pain got better but she has developed some knots in her fingers.  She was experiencing pain in her right hip in April 2021 to the point she was having difficulty walking.  She started seeing Dr. May 2021 and eventually the MRI showed labral tear and tendinopathy.  She had PRP injection by Dr. Penni Bombard about 2 months ago and her symptoms have improved dramatically.  She states she also had x-ray of her lumbar spine at the time which showed degenerative changes.  She denies any radiculopathy.  She was experiencing some right knee joint pain at the time coming from her right hip which has resolved.  None of the other joints are painful.  There is no family history of autoimmune disease.  She is gravida 5, para 3, miscarriages 2.  Activities of Daily Living:  Patient reports morning stiffness for 0  minutes.   Patient Denies nocturnal pain.  Difficulty dressing/grooming: Denies Difficulty climbing stairs: Reports Difficulty getting out of chair: Denies Difficulty using hands for taps, buttons, cutlery, and/or writing: Denies  Review of  Systems  Constitutional: Negative for fatigue.  HENT: Negative for mouth sores, mouth dryness and nose dryness.   Eyes: Negative for pain, itching and dryness.  Respiratory: Negative for shortness of breath and difficulty breathing.   Cardiovascular: Negative for chest pain and palpitations.  Gastrointestinal: Negative for blood in stool, constipation and diarrhea.  Endocrine: Negative for increased urination.  Genitourinary: Negative for difficulty urinating.  Musculoskeletal: Positive for arthralgias and joint pain. Negative for joint swelling, myalgias, morning stiffness, muscle tenderness and myalgias.  Skin: Negative for color change, rash and redness.  Allergic/Immunologic: Negative for susceptible to infections.  Neurological: Positive for headaches and memory loss. Negative for dizziness, numbness and weakness.  Hematological: Positive for bruising/bleeding tendency.  Psychiatric/Behavioral: Negative for confusion.    PMFS History:  Patient Active Problem List   Diagnosis Date Noted  . Chronic rhinosinusitis 12/24/2020  . Encounter to establish care 12/24/2020  . Mild intermittent asthma without complication 12/24/2020  . Elevated rheumatoid factor 12/24/2020  . Degeneration of lumbar intervertebral disc 10/28/2020  . Hair loss 09/30/2020  . Low back pain 07/17/2020  . Gluteal tendinitis of right buttock 04/19/2020  . Labral tear of right hip joint 04/19/2020  . Pain in joint of right shoulder 06/29/2018  . History of HPV infection 11/15/2017  . HLD (hyperlipidemia) 05/12/2017  . RBBB 05/12/2017  . Calcification  of aorta ([place]) 04/04/2017  . Chronic hepatitis B ([place]) 03/29/2017  . Lichen sclerosus of female genitalia 03/29/2017  . Osteoporosis 03/29/2017  . Migraine 08/16/2016    Past Medical History:  Diagnosis Date  . Allergy   . Anxiety and depression   . Arthritis   . Asthma   . Cataract   . Chronic active type B viral hepatitis ([place])   . Depression   . HLD  (hyperlipidemia) 05/12/2017  . Migraines   . Osteoporosis     Family History  Problem Relation Age of Onset  . Uterine cancer Mother   . Migraines Father   . Early death Father 65       suicide   . Transient ischemic attack Father        multiple   . Migraines Brother   . Healthy Daughter   . Drug abuse Son        addict - stable on suboxone  . Hepatitis B Son   . Healthy Son   . Cancer Maternal Grandmother        breast  . Alcohol abuse Paternal Grandfather   . Early death Paternal Aunt        suicide  . Mental illness Paternal Aunt    Past Surgical History:  Procedure Laterality Date  . CESAREAN SECTION     x3  . LIVER BIOPSY  1993  . URETHRAL DILATION    . WISDOM TOOTH EXTRACTION     Social History   Social History Narrative   Retired Web designer   Three children  [place], [place] and [place] 2   Right handed   [employer] History  Administered Date(s) Administered  . Fluad Quad(high Dose 65+) 09/14/2019  . Influenza,inj,Quad PF,6+ Mos 09/19/2017  . Influenza,inj,quad, With Preservative 09/19/2017, 09/20/2019  . Influenza-Unspecified 09/19/2018  . Moderna Sars-Covid-2 Vaccination 02/14/2020, 03/13/2020, 11/13/2020  . Pneumococcal Conjugate-13 03/26/2015  . Pneumococcal Polysaccharide-23 07/21/2011, 09/19/2017  . Td 01/15/2011  . Zoster 03/23/2010     Objective: Vital Signs: BP (!) 152/90 (BP Location: Left Arm, Patient Position: Sitting, Cuff Size: Normal)   Pulse 73   Resp 16   Ht 5' 1.5" (1.562 m)   Wt 172 lb (78 kg)   BMI 31.97 kg/m    Physical Exam Vitals and nursing note reviewed.  Constitutional:      Appearance: She is well-developed and well-nourished.  HENT:     Head: Normocephalic and atraumatic.  Eyes:     Extraocular Movements: EOM normal.     Conjunctiva/sclera: Conjunctivae normal.  Cardiovascular:     Rate and Rhythm: Normal rate and regular rhythm.     Pulses: Intact distal pulses.      Heart sounds: Normal heart sounds.  Pulmonary:     Effort: Pulmonary effort is normal.     Breath sounds: Normal breath sounds.  Abdominal:     General: Bowel sounds are normal.     Palpations: Abdomen is soft.  Musculoskeletal:     Cervical back: Normal range of motion.  Lymphadenopathy:     Cervical: No cervical adenopathy.  Skin:    General: Skin is warm and dry.     Capillary Refill: Capillary refill takes less than 2 seconds.  Neurological:     Mental Status: She is alert and oriented to person, place, and time.  Psychiatric:        Mood and Affect: Mood and affect normal.  Behavior: Behavior normal.      Musculoskeletal Exam: C-spine was in good range of motion.  Shoulder joints and elbow joints with good range of motion.  She has bilateral DIP thickening but no synovitis.  She had good range of motion of bilateral hip joints and knee joints.  There was some warmth on palpation of her left knee joint.  Ankle joints with good range of motion.  There was no tenderness over MTPs.  CDAI Exam: CDAI Score: -- Patient Global: --; Provider Global: -- Swollen: --; Tender: -- Joint Exam 12/30/2020   No joint exam has been documented for this visit   There is currently no information documented on the homunculus. Go to the Rheumatology activity and complete the homunculus joint exam.  Investigation: No additional findings.  Imaging: No results found.  Recent Labs: Lab Results  Component Value Date   WBC 4.5 12/25/2020   HGB 16.3 (H) 12/25/2020   PLT 281 12/25/2020   NA 139 12/25/2020   K 4.3 12/25/2020   CL 102 12/25/2020   CO2 25 12/25/2020   GLUCOSE 90 12/25/2020   BUN 13 12/25/2020   CREATININE 0.76 12/25/2020   BILITOT 0.5 12/25/2020   ALKPHOS 86 12/25/2020   AST 29 12/25/2020   ALT 19 12/25/2020   PROT 7.2 12/25/2020   ALBUMIN 4.5 12/25/2020   CALCIUM 9.5 12/25/2020   GFRAA 91 12/25/2020    Speciality Comments: No specialty comments  available.  Procedures:  No procedures performed Allergies: Codeine   Assessment / Plan:     Visit Diagnoses: Rheumatoid factor positive - 06/18/20: RF 157.0, CCP 5.  Patient has no clinical features of rheumatoid arthritis.  She gives history of positive rheumatoid factor for many years.  She had no synovitis on examination.  I detailed discussion with the patient that it is not unusual to see positive rheumatoid factor with hepatitis B.  Her anti-CCP antibody was negative.  Pain in both hands-she had bilateral DIP thickening with no synovitis.  She states she initially had more discomfort in her hands and over time the pain resolved.  Pain in right hip-she has been having right hip pain since April 2021.  She was diagnosed with labral tear.  She had PRP injection by Dr. Delilah Shan 2 months ago and had an excellent response to it.  Chronic pain of left knee -she complains of discomfort in her left knee joint off and on.  She has some warmth on palpation today.  Plan: XR KNEE 3 VIEW LEFT, x-ray was consistent with moderate osteoarthritis and moderate chondromalacia patella.  I have given her a handout on lower extremity muscle strengthening exercises.  Use of water aerobics was also discussed.  I will check uric acid  Tear of right acetabular labrum, sequela-good response to PRP injection.  DDD (degenerative disc disease), lumbar-patient states that she was told that she has some degenerative changes in her lumbar spine on the x-rays.  She currently does not have any lower back pain.  Age-related osteoporosis without current pathological fracture -recent diagnosis.  Patient states she took [place] for 1 month and then stop.  She will restart the medication in the future.  Use of calcium, vitamin D and resistive exercises was discussed.  Chronic viral hepatitis B ([place]) - dxd 1993 in [place].  Now she is followed by Dr. Laural Golden in [place].  Other medical problems are listed as  follows:  RBBB  Calcification of aorta (HCC)  Mixed hyperlipidemia  History of non anemic  vitamin B12 deficiency  Hx of migraine with aura  History of anxiety  History of HPV infection  Lichen sclerosus of female genitalia  Orders: Orders Placed This Encounter  Procedures  . XR KNEE 3 VIEW LEFT  . Uric acid   No orders of the defined types were placed in this encounter.    Follow-Up Instructions: Return for Osteoarthritis.   Bo Merino, MD  Note - This record has been created using Editor, commissioning.  Chart creation errors have been sought, but may not always  have been located. Such creation errors do not reflect on  the standard of medical care.

## 2020-12-23 ENCOUNTER — Encounter: Payer: Self-pay | Admitting: Neurology

## 2020-12-23 NOTE — Progress Notes (Signed)
Alyssa Durham (Key: [id number]) Nurtec 75MG  dispersible tablets   Form [employer] 4-Part Electronic PA Form (2017 NCPDP) Created 3 days ago Sent to Plan less than a minute ago Plan Response less than a minute ago Submit Clinical Questions Determination N/A Message from Plan Available without authorization.

## 2020-12-24 ENCOUNTER — Ambulatory Visit (INDEPENDENT_AMBULATORY_CARE_PROVIDER_SITE_OTHER): Payer: PPO | Admitting: Internal Medicine

## 2020-12-24 ENCOUNTER — Encounter: Payer: Self-pay | Admitting: Internal Medicine

## 2020-12-24 ENCOUNTER — Other Ambulatory Visit: Payer: Self-pay

## 2020-12-24 VITALS — BP 141/81 | HR 87 | Temp 97.7°F | Resp 18 | Ht 62.0 in | Wt 170.4 lb

## 2020-12-24 DIAGNOSIS — L659 Nonscarring hair loss, unspecified: Secondary | ICD-10-CM | POA: Diagnosis not present

## 2020-12-24 DIAGNOSIS — Z7689 Persons encountering health services in other specified circumstances: Secondary | ICD-10-CM

## 2020-12-24 DIAGNOSIS — N904 Leukoplakia of vulva: Secondary | ICD-10-CM | POA: Diagnosis not present

## 2020-12-24 DIAGNOSIS — J329 Chronic sinusitis, unspecified: Secondary | ICD-10-CM | POA: Diagnosis not present

## 2020-12-24 DIAGNOSIS — M810 Age-related osteoporosis without current pathological fracture: Secondary | ICD-10-CM

## 2020-12-24 DIAGNOSIS — B181 Chronic viral hepatitis B without delta-agent: Secondary | ICD-10-CM | POA: Diagnosis not present

## 2020-12-24 DIAGNOSIS — J310 Chronic rhinitis: Secondary | ICD-10-CM | POA: Diagnosis not present

## 2020-12-24 DIAGNOSIS — R768 Other specified abnormal immunological findings in serum: Secondary | ICD-10-CM | POA: Insufficient documentation

## 2020-12-24 DIAGNOSIS — G43819 Other migraine, intractable, without status migrainosus: Secondary | ICD-10-CM | POA: Diagnosis not present

## 2020-12-24 DIAGNOSIS — M7601 Gluteal tendinitis, right hip: Secondary | ICD-10-CM | POA: Diagnosis not present

## 2020-12-24 DIAGNOSIS — J4520 Mild intermittent asthma, uncomplicated: Secondary | ICD-10-CM | POA: Insufficient documentation

## 2020-12-24 MED ORDER — LEVOCETIRIZINE DIHYDROCHLORIDE 5 MG PO TABS: 5.0000 mg | ORAL_TABLET | Freq: Every evening | ORAL | 5 refills | Status: DC

## 2020-12-24 MED ORDER — FLUTICASONE PROPIONATE 50 MCG/ACT NA SUSP: 2.0000 | Freq: Every day | NASAL | 6 refills | Status: DC

## 2020-12-24 MED ORDER — ALBUTEROL SULFATE HFA 108 (90 BASE) MCG/ACT IN AERS: 2.0000 | INHALATION_SPRAY | Freq: Four times a day (QID) | RESPIRATORY_TRACT | 5 refills | Status: DC | PRN

## 2020-12-24 NOTE — Assessment & Plan Note (Signed)
Advised to take Calcium and Vitamin D supplements Advised to restart Boniva

## 2020-12-24 NOTE — Assessment & Plan Note (Signed)
Uses Clobetasol cream PRN Controlled currently

## 2020-12-24 NOTE — Assessment & Plan Note (Signed)
On Relpax Was also on Nurtec Follows up with Neurology

## 2020-12-24 NOTE — Assessment & Plan Note (Signed)
Follows up with Orthopedic surgeon Has platelet-rich plasma, better with it

## 2020-12-24 NOTE — Assessment & Plan Note (Signed)
Reports history of elevated RF in the past Check RF, has an appointment with Rheumatologist

## 2020-12-24 NOTE — Assessment & Plan Note (Signed)
C/o chronic rhinitis and postnasal drip Started Flonase and Levocetirizine

## 2020-12-24 NOTE — Patient Instructions (Signed)
Please start using Flonase as prescribed for nasal congestion and allergies.  Please start taking Levocetirizine for allergies.  Please get fasting blood tests done in this week.  Please take Vitamins - Hair, nail and skin for hair loss.  Please continue to follow up with your Neurologist for migraine.  Please follow up with Rheumatologist and Orthopedic surgeon as scheduled.

## 2020-12-24 NOTE — Assessment & Plan Note (Signed)
Care established Previous chart reviewed History and medications reviewed with the patient 

## 2020-12-24 NOTE — Assessment & Plan Note (Signed)
Liver enzymes stable Follows up with GI

## 2020-12-24 NOTE — Assessment & Plan Note (Signed)
Uses Albuterol PRN, reilled Albuterol Likely related to allergies

## 2020-12-24 NOTE — Progress Notes (Signed)
New Patient Office Visit  Subjective:  Patient ID: Alyssa Durham, female    DOB: [birth year]  Age: 70 y.o. MRN: [id number]  CC:  Chief Complaint  Patient presents with  . New Patient (Initial Visit)    New patient for the last month has a lot of nasal drainage also has been clearing her throat a lot which seems like it has caused laryngitis she also has a little phlegm coming up     HPI Alyssa Durham is a 70 year old female with past medical history of chronic migraine, mild intermittent asthma related to allergies, osteoporosis, chronic hep B, lichen sclerosus, and gluteal tendinitis of right buttock who presents for establishing care.  Patient complains of chronic congestion and postnasal drip.  She mentions constant need to clear her throat.  She denies any known environmental allergies.  Of note, she has no history of asthma, for which she uses albuterol inhaler as needed.  She has history of lichen sclerosus of the genital area, for which she uses clobetasol cream and has had good response with it.  Patient follows up with neurology for chronic migraine and takes Relpax for it.  She was on Nurtec in the past.  She reports history of chronic low back pain associated with right buttock pain and leg pain.  She follows up with orthopedic surgeon for it and has had platelet rich plasma infusion for it.  She reports good response with take and is able to walk without support now.  Patient complains of chronic hair loss, but denies any itching or patches of hair loss.  She reports history of elevated rheumatoid factor, for which she has an appointment with rheumatologist.  She has a history of osteoporosis, for which she was prescribed Boniva, but she stopped taking it. She is advised to continue Boniva.  Last Cologuard in 07/2020.  Last mammography in 09/2018.  Patient is up to date with Covid and flu vaccine.  She has had pneumonia vaccine as well.  Past Medical History:   Diagnosis Date  . Allergy   . Anxiety and depression   . Arthritis   . Asthma   . Cataract   . Chronic active type B viral hepatitis ([place])   . Depression   . HLD (hyperlipidemia) 05/12/2017  . Migraines   . Osteoporosis     Past Surgical History:  Procedure Laterality Date  . CESAREAN SECTION    . URETHRAL DILATION    . WISDOM TOOTH EXTRACTION      Family History  Problem Relation Age of Onset  . Uterine cancer Mother   . Migraines Father   . Early death Father 99       suicide   . Transient ischemic attack Father        multiple   . Migraines Brother   . Drug abuse Son        addict - stable on suboxone  . Hepatitis B Son   . Cancer Maternal Grandmother        breast  . Alcohol abuse Paternal Grandfather   . Early death Paternal Aunt        suicide  . Mental illness Paternal Aunt     Social History   Socioeconomic History  . Marital status: Divorced    Spouse name: Not on file  . Number of children: 3  . Years of education: 31  . Highest education level: Not on file  Occupational History  . Occupation: retired  Comment: Admin assistant  Tobacco Use  . Smoking status: Former Smoker    Packs/day: 2.00    Years: 5.00    Pack years: 10.00    Types: Cigarettes    Start date: 12/21/1967    Quit date: 12/20/1972    Years since quitting: 48.0  . Smokeless tobacco: Never Used  Vaping Use  . Vaping Use: Never used  Substance and Sexual Activity  . Alcohol use: No  . Drug use: No  . Sexual activity: Not Currently  Other Topics Concern  . Not on file  Social History Narrative   Retired Web designer   Three children  [place] and [employer] - 2   Right handed   [employer]   Social Determinants of Health   Financial Resource Strain: Not on file  Food Insecurity: Not on file  Transportation Needs: Not on file  Physical Activity: Not on file  Stress: Not on file  Social Connections: Not on file  Intimate Partner  Violence: Not on file    ROS Review of Systems  Constitutional: Negative for chills and fever.  HENT: Positive for postnasal drip, rhinorrhea and sore throat. Negative for congestion, sinus pressure and sinus pain.   Eyes: Negative for pain and discharge.  Respiratory: Negative for cough and shortness of breath.   Cardiovascular: Negative for chest pain and palpitations.  Gastrointestinal: Negative for abdominal pain, constipation, diarrhea, nausea and vomiting.  Endocrine: Negative for polydipsia and polyuria.  Genitourinary: Negative for dysuria and hematuria.  Musculoskeletal: Positive for arthralgias and back pain. Negative for neck pain and neck stiffness.  Skin: Negative for rash.  Neurological: Negative for dizziness, seizures, syncope and weakness.  Psychiatric/Behavioral: Negative for agitation and behavioral problems.    Objective:   Today's Vitals: BP (!) 141/81 (BP Location: Left Arm, Patient Position: Sitting, Cuff Size: Normal)   Pulse 87   Temp 97.7 F (36.5 C) (Oral)   Resp 18   Ht _0  (1.575 m)   Wt 170 lb 6.4 oz (77.3 kg)   SpO2 99%   BMI 31.17 kg/m   Physical Exam Vitals reviewed.  Constitutional:      General: She is not in acute distress.    Appearance: She is not diaphoretic.  HENT:     Head: Normocephalic and atraumatic.     Nose: Nose normal.     Mouth/Throat:     Mouth: Mucous membranes are moist.  Eyes:     General: No scleral icterus.    Extraocular Movements: Extraocular movements intact.     Pupils: Pupils are equal, round, and reactive to light.  Cardiovascular:     Rate and Rhythm: Normal rate and regular rhythm.     Pulses: Normal pulses.     Heart sounds: Normal heart sounds. No murmur heard.   Pulmonary:     Breath sounds: Normal breath sounds. No wheezing or rales.  Abdominal:     Palpations: Abdomen is soft.     Tenderness: There is no abdominal tenderness.  Musculoskeletal:     Cervical back: Neck supple. No tenderness.      Right lower leg: No edema.     Left lower leg: No edema.  Skin:    General: Skin is warm.     Findings: No rash.  Neurological:     General: No focal deficit present.     Mental Status: She is alert and oriented to person, place, and time.     Sensory: No sensory  deficit.     Motor: No weakness.  Psychiatric:        Mood and Affect: Mood normal.        Behavior: Behavior normal.     Assessment & Plan:   Problem List Items Addressed This Visit      Encounter to establish care - Primary   Care established Previous chart reviewed History and medications reviewed with the patient     Relevant Orders  CBC with Differential  CMP14+EGFR  HgB A1c  Lipid Profile  TSH + free T4  Vitamin D (25 hydroxy)  Rheumatoid Factor    Cardiovascular and Mediastinum   Migraine    On Relpax Was also on Nurtec Follows up with Neurology        Respiratory   Chronic rhinosinusitis    C/o chronic rhinitis and postnasal drip Started Flonase and Levocetirizine      Relevant Medications   fluticasone (FLONASE) 50 MCG/ACT nasal spray   levocetirizine (XYZAL) 5 MG tablet   Mild intermittent asthma without complication    Uses Albuterol PRN, reilled Albuterol Likely related to allergies      Relevant Medications   albuterol (PROAIR HFA) 108 (90 Base) MCG/ACT inhaler     Digestive   Chronic hepatitis B ([facility])    Liver enzymes stable Follows up with GI        Musculoskeletal and Integument   Osteoporosis    Advised to take Calcium and Vitamin D supplements Advised to restart Boniva      Relevant Medications   ibandronate (BONIVA) 150 MG tablet   Gluteal tendinitis of right buttock    Follows up with Orthopedic surgeon Has platelet-rich plasma, better with it        Genitourinary   Lichen sclerosus of female genitalia    Uses Clobetasol cream PRN Controlled currently        Other   Hair loss Advised to take multivitamin supplements including Biotin Check TSH     High BP Likely due to nervousness at first visit Will check BP in subsequent visit      Elevated rheumatoid factor    Reports history of elevated RF in the past Check RF, has an appointment with Rheumatologist         Outpatient Encounter Medications as of 12/24/2020  Medication Sig  . Clobetasol Prop Emollient Base 0.05 % emollient cream Apply topically 2 (two) times daily.  . fluticasone (FLONASE) 50 MCG/ACT nasal spray Place 2 sprays into both nostrils daily.  Marland Kitchen ibandronate (BONIVA) 150 MG tablet Take 150 mg by mouth every 30 (thirty) days. Take in the morning with a full glass of water, on an empty stomach, and do not take anything else by mouth or lie down for the next 30 min.  Marland Kitchen levocetirizine (XYZAL) 5 MG tablet Take 1 tablet (5 mg total) by mouth every evening.  Marland Kitchen RELPAX 40 MG tablet TAKE 1 TABLET BY MOUTH AT ONSET OF MIGRAINE. DO NOT TAKE MORE THAN 3 TABLETS A WEEK  . tretinoin (RETIN-A) 0.1 % cream Apply topically.  . [DISCONTINUED] PROAIR HFA 108 (90 Base) MCG/ACT inhaler INHALE 2 PUFFS INTO THE LUNGS EVERY 6 HOURS AS NEEDED FOR WHEEZING OR SHORTNESS OF BREATH  . albuterol (PROAIR HFA) 108 (90 Base) MCG/ACT inhaler Inhale 2 puffs into the lungs every 6 (six) hours as needed for wheezing or shortness of breath.  . Rimegepant Sulfate (NURTEC) 75 MG TBDP Take 1 tablet by mouth as needed. Take 1 tablet as  needed for migraine. Do not take more than 1 in a 24 hour period (Patient not taking: Reported on 12/24/2020)  . [DISCONTINUED] meloxicam (MOBIC) 7.5 MG tablet Take 7.5 mg by mouth daily.   No facility-administered encounter medications on file as of 12/24/2020.    Follow-up: Return in about 4 months (around 04/23/2021).   Lindell Spar, MD

## 2020-12-25 DIAGNOSIS — Z7689 Persons encountering health services in other specified circumstances: Secondary | ICD-10-CM | POA: Diagnosis not present

## 2020-12-26 LAB — CBC WITH DIFFERENTIAL/PLATELET
Basophils Absolute: 0.1 10*3/uL (ref 0.0–0.2)
Basos: 1 %
EOS (ABSOLUTE): 0.1 10*3/uL (ref 0.0–0.4)
Eos: 2 %
Hematocrit: 47.6 % — ABNORMAL HIGH (ref 34.0–46.6)
Hemoglobin: 16.3 g/dL — ABNORMAL HIGH (ref 11.1–15.9)
Immature Grans (Abs): 0 10*3/uL (ref 0.0–0.1)
Immature Granulocytes: 0 %
Lymphocytes Absolute: 1 10*3/uL (ref 0.7–3.1)
Lymphs: 23 %
MCH: 31.2 pg (ref 26.6–33.0)
MCHC: 34.2 g/dL (ref 31.5–35.7)
MCV: 91 fL (ref 79–97)
Monocytes Absolute: 0.5 10*3/uL (ref 0.1–0.9)
Monocytes: 11 %
Neutrophils Absolute: 2.9 10*3/uL (ref 1.4–7.0)
Neutrophils: 63 %
Platelets: 281 10*3/uL (ref 150–450)
RBC: 5.22 x10E6/uL (ref 3.77–5.28)
RDW: 12 % (ref 11.7–15.4)
WBC: 4.5 10*3/uL (ref 3.4–10.8)

## 2020-12-26 LAB — CMP14+EGFR
ALT: 19 IU/L (ref 0–32)
AST: 29 IU/L (ref 0–40)
Albumin/Globulin Ratio: 1.7 (ref 1.2–2.2)
Albumin: 4.5 g/dL (ref 3.7–4.7)
Alkaline Phosphatase: 86 IU/L (ref 44–121)
BUN/Creatinine Ratio: 17 (ref 12–28)
BUN: 13 mg/dL (ref 8–27)
Bilirubin Total: 0.5 mg/dL (ref 0.0–1.2)
CO2: 25 mmol/L (ref 20–29)
Calcium: 9.5 mg/dL (ref 8.7–10.3)
Chloride: 102 mmol/L (ref 96–106)
Creatinine, Ser: 0.76 mg/dL (ref 0.57–1.00)
GFR calc Af Amer: 91 mL/min/{1.73_m2} (ref >59)
GFR calc non Af Amer: 79 mL/min/{1.73_m2} (ref >59)
Globulin, Total: 2.7 g/dL (ref 1.5–4.5)
Glucose: 90 mg/dL (ref 65–99)
Potassium: 4.3 mmol/L (ref 3.5–5.2)
Sodium: 139 mmol/L (ref 134–144)
Total Protein: 7.2 g/dL (ref 6.0–8.5)

## 2020-12-26 LAB — VITAMIN D 25 HYDROXY (VIT D DEFICIENCY, FRACTURES): Vit D, 25-Hydroxy: 25.4 ng/mL — ABNORMAL LOW (ref 30.0–100.0)

## 2020-12-26 LAB — RHEUMATOID FACTOR: Rheumatoid fact SerPl-aCnc: 137.6 IU/mL — ABNORMAL HIGH (ref <14.0)

## 2020-12-26 LAB — TSH+FREE T4
Free T4: 1.16 ng/dL (ref 0.82–1.77)
TSH: 1.97 u[IU]/mL (ref 0.450–4.500)

## 2020-12-26 LAB — LIPID PANEL
Chol/HDL Ratio: 3.4 ratio (ref 0.0–4.4)
Cholesterol, Total: 235 mg/dL — ABNORMAL HIGH (ref 100–199)
HDL: 69 mg/dL (ref >39)
LDL Chol Calc (NIH): 140 mg/dL — ABNORMAL HIGH (ref 0–99)
Triglycerides: 149 mg/dL (ref 0–149)
VLDL Cholesterol Cal: 26 mg/dL (ref 5–40)

## 2020-12-26 LAB — HEMOGLOBIN A1C
Est. average glucose Bld gHb Est-mCnc: 105 mg/dL
Hgb A1c MFr Bld: 5.3 % (ref 4.8–5.6)

## 2020-12-28 ENCOUNTER — Other Ambulatory Visit: Payer: Self-pay | Admitting: Neurology

## 2020-12-30 ENCOUNTER — Ambulatory Visit: Payer: Self-pay

## 2020-12-30 ENCOUNTER — Other Ambulatory Visit: Payer: Self-pay

## 2020-12-30 ENCOUNTER — Encounter: Payer: Self-pay | Admitting: Rheumatology

## 2020-12-30 ENCOUNTER — Ambulatory Visit: Payer: PPO | Admitting: Rheumatology

## 2020-12-30 VITALS — BP 152/90 | HR 73 | Resp 16 | Ht 61.5 in | Wt 172.0 lb

## 2020-12-30 DIAGNOSIS — S73191S Other sprain of right hip, sequela: Secondary | ICD-10-CM

## 2020-12-30 DIAGNOSIS — B181 Chronic viral hepatitis B without delta-agent: Secondary | ICD-10-CM

## 2020-12-30 DIAGNOSIS — M25562 Pain in left knee: Secondary | ICD-10-CM

## 2020-12-30 DIAGNOSIS — M810 Age-related osteoporosis without current pathological fracture: Secondary | ICD-10-CM

## 2020-12-30 DIAGNOSIS — I4510 Unspecified right bundle-branch block: Secondary | ICD-10-CM | POA: Diagnosis not present

## 2020-12-30 DIAGNOSIS — G8929 Other chronic pain: Secondary | ICD-10-CM

## 2020-12-30 DIAGNOSIS — Z8669 Personal history of other diseases of the nervous system and sense organs: Secondary | ICD-10-CM

## 2020-12-30 DIAGNOSIS — I700 Atherosclerosis of aorta: Secondary | ICD-10-CM

## 2020-12-30 DIAGNOSIS — M25551 Pain in right hip: Secondary | ICD-10-CM

## 2020-12-30 DIAGNOSIS — N904 Leukoplakia of vulva: Secondary | ICD-10-CM

## 2020-12-30 DIAGNOSIS — E782 Mixed hyperlipidemia: Secondary | ICD-10-CM | POA: Diagnosis not present

## 2020-12-30 DIAGNOSIS — R768 Other specified abnormal immunological findings in serum: Secondary | ICD-10-CM | POA: Diagnosis not present

## 2020-12-30 DIAGNOSIS — Z8619 Personal history of other infectious and parasitic diseases: Secondary | ICD-10-CM

## 2020-12-30 DIAGNOSIS — M5136 Other intervertebral disc degeneration, lumbar region: Secondary | ICD-10-CM

## 2020-12-30 DIAGNOSIS — M79641 Pain in right hand: Secondary | ICD-10-CM | POA: Diagnosis not present

## 2020-12-30 DIAGNOSIS — Z8659 Personal history of other mental and behavioral disorders: Secondary | ICD-10-CM

## 2020-12-30 DIAGNOSIS — M79642 Pain in left hand: Secondary | ICD-10-CM

## 2020-12-30 DIAGNOSIS — Z8639 Personal history of other endocrine, nutritional and metabolic disease: Secondary | ICD-10-CM

## 2020-12-30 NOTE — Patient Instructions (Signed)
Journal for Nurse Practitioners, 15(4), 263-267. Retrieved September 25, 2018 from http://clinicalkey.com/nursing">  Knee Exercises Ask your health care provider which exercises are safe for you. Do exercises exactly as told by your health care provider and adjust them as directed. It is normal to feel mild stretching, pulling, tightness, or discomfort as you do these exercises. Stop right away if you feel sudden pain or your pain gets worse. Do not begin these exercises until told by your health care provider. Stretching and range-of-motion exercises These exercises warm up your muscles and joints and improve the movement and flexibility of your knee. These exercises also help to relieve pain and swelling. Knee extension, prone 1. Lie on your abdomen (prone position) on a bed. 2. Place your left / right knee just beyond the edge of the surface so your knee is not on the bed. You can put a towel under your left / right thigh just above your kneecap for comfort. 3. Relax your leg muscles and allow gravity to straighten your knee (extension). You should feel a stretch behind your left / right knee. 4. Hold this position for __________ seconds. 5. Scoot up so your knee is supported between repetitions. Repeat __________ times. Complete this exercise __________ times a day. Knee flexion, active 1. Lie on your back with both legs straight. If this causes back discomfort, bend your left / right knee so your foot is flat on the floor. 2. Slowly slide your left / right heel back toward your buttocks. Stop when you feel a gentle stretch in the front of your knee or thigh (flexion). 3. Hold this position for __________ seconds. 4. Slowly slide your left / right heel back to the starting position. Repeat __________ times. Complete this exercise __________ times a day.   Quadriceps stretch, prone 1. Lie on your abdomen on a firm surface, such as a bed or padded floor. 2. Bend your left / right knee and hold  your ankle. If you cannot reach your ankle or pant leg, loop a belt around your foot and grab the belt instead. 3. Gently pull your heel toward your buttocks. Your knee should not slide out to the side. You should feel a stretch in the front of your thigh and knee (quadriceps). 4. Hold this position for __________ seconds. Repeat __________ times. Complete this exercise __________ times a day.   Hamstring, supine 1. Lie on your back (supine position). 2. Loop a belt or towel over the ball of your left / right foot. The ball of your foot is on the walking surface, right under your toes. 3. Straighten your left / right knee and slowly pull on the belt to raise your leg until you feel a gentle stretch behind your knee (hamstring). ? Do not let your knee bend while you do this. ? Keep your other leg flat on the floor. 4. Hold this position for __________ seconds. Repeat __________ times. Complete this exercise __________ times a day. Strengthening exercises These exercises build strength and endurance in your knee. Endurance is the ability to use your muscles for a long time, even after they get tired. Quadriceps, isometric This exercise stretches the muscles in front of your thigh (quadriceps) without moving your knee joint (isometric). 1. Lie on your back with your left / right leg extended and your other knee bent. Put a rolled towel or small pillow under your knee if told by your health care provider. 2. Slowly tense the muscles in the front of your   left / right thigh. You should see your kneecap slide up toward your hip or see increased dimpling just above the knee. This motion will push the back of the knee toward the floor. 3. For __________ seconds, hold the muscle as tight as you can without increasing your pain. 4. Relax the muscles slowly and completely. Repeat __________ times. Complete this exercise __________ times a day.   Straight leg raises This exercise stretches the muscles in  front of your thigh (quadriceps) and the muscles that move your hips (hip flexors). 1. Lie on your back with your left / right leg extended and your other knee bent. 2. Tense the muscles in the front of your left / right thigh. You should see your kneecap slide up or see increased dimpling just above the knee. Your thigh may even shake a bit. 3. Keep these muscles tight as you raise your leg 4-6 inches (10-15 cm) off the floor. Do not let your knee bend. 4. Hold this position for __________ seconds. 5. Keep these muscles tense as you lower your leg. 6. Relax your muscles slowly and completely after each repetition. Repeat __________ times. Complete this exercise __________ times a day. Hamstring, isometric 1. Lie on your back on a firm surface. 2. Bend your left / right knee about __________ degrees. 3. Dig your left / right heel into the surface as if you are trying to pull it toward your buttocks. Tighten the muscles in the back of your thighs (hamstring) to "dig" as hard as you can without increasing any pain. 4. Hold this position for __________ seconds. 5. Release the tension gradually and allow your muscles to relax completely for __________ seconds after each repetition. Repeat __________ times. Complete this exercise __________ times a day. Hamstring curls If told by your health care provider, do this exercise while wearing ankle weights. Begin with __________ lb weights. Then increase the weight by 1 lb (0.5 kg) increments. Do not wear ankle weights that are more than __________ lb. 1. Lie on your abdomen with your legs straight. 2. Bend your left / right knee as far as you can without feeling pain. Keep your hips flat against the floor. 3. Hold this position for __________ seconds. 4. Slowly lower your leg to the starting position. Repeat __________ times. Complete this exercise __________ times a day.   Squats This exercise strengthens the muscles in front of your thigh and knee  (quadriceps). 1. Stand in front of a table, with your feet and knees pointing straight ahead. You may rest your hands on the table for balance but not for support. 2. Slowly bend your knees and lower your hips like you are going to sit in a chair. ? Keep your weight over your heels, not over your toes. ? Keep your lower legs upright so they are parallel with the table legs. ? Do not let your hips go lower than your knees. ? Do not bend lower than told by your health care provider. ? If your knee pain increases, do not bend as low. 3. Hold the squat position for __________ seconds. 4. Slowly push with your legs to return to standing. Do not use your hands to pull yourself to standing. Repeat __________ times. Complete this exercise __________ times a day. Wall slides This exercise strengthens the muscles in front of your thigh and knee (quadriceps). 1. Lean your back against a smooth wall or door, and walk your feet out 18-24 inches (46-61 cm) from it. 2.   Place your feet hip-width apart. 3. Slowly slide down the wall or door until your knees bend __________ degrees. Keep your knees over your heels, not over your toes. Keep your knees in line with your hips. 4. Hold this position for __________ seconds. Repeat __________ times. Complete this exercise __________ times a day.   Straight leg raises This exercise strengthens the muscles that rotate the leg at the hip and move it away from your body (hip abductors). 1. Lie on your side with your left / right leg in the top position. Lie so your head, shoulder, knee, and hip line up. You may bend your bottom knee to help you keep your balance. 2. Roll your hips slightly forward so your hips are stacked directly over each other and your left / right knee is facing forward. 3. Leading with your heel, lift your top leg 4-6 inches (10-15 cm). You should feel the muscles in your outer hip lifting. ? Do not let your foot drift forward. ? Do not let your  knee roll toward the ceiling. 4. Hold this position for __________ seconds. 5. Slowly return your leg to the starting position. 6. Let your muscles relax completely after each repetition. Repeat __________ times. Complete this exercise __________ times a day.   Straight leg raises This exercise stretches the muscles that move your hips away from the front of the pelvis (hip extensors). 1. Lie on your abdomen on a firm surface. You can put a pillow under your hips if that is more comfortable. 2. Tense the muscles in your buttocks and lift your left / right leg about 4-6 inches (10-15 cm). Keep your knee straight as you lift your leg. 3. Hold this position for __________ seconds. 4. Slowly lower your leg to the starting position. 5. Let your leg relax completely after each repetition. Repeat __________ times. Complete this exercise __________ times a day. This information is not intended to replace advice given to you by your health care provider. Make sure you discuss any questions you have with your health care provider. Document Revised: 09/26/2018 Document Reviewed: 09/26/2018 Elsevier Patient Education  2021 [employer]  

## 2020-12-31 ENCOUNTER — Telehealth: Payer: Self-pay | Admitting: Rheumatology

## 2020-12-31 ENCOUNTER — Telehealth: Payer: Self-pay

## 2020-12-31 LAB — SPECIMEN COMPROMISED

## 2020-12-31 NOTE — Telephone Encounter (Signed)
I did not order CMP yesterday. The only lab order was uric acid. Bo Merino, MD

## 2020-12-31 NOTE — Progress Notes (Signed)
CMP could not be performed as the specimen was compromised.

## 2020-12-31 NOTE — Telephone Encounter (Signed)
Patient was told by rheumotologist to call and let us know they did xrays of her knee due to pain in the back of the knee and was going to give her a cortisone shot but couldn't due to her blood pressure being high she also is curious about labs she had done last week p# [phone]

## 2020-12-31 NOTE — Progress Notes (Signed)
Uric acid is normal

## 2020-12-31 NOTE — Telephone Encounter (Signed)
I called her and discussed about it. Please schedule a visit with me tomorrow morning in office as she is complaining of leg swelling.

## 2020-12-31 NOTE — Telephone Encounter (Signed)
FYI

## 2021-01-01 ENCOUNTER — Other Ambulatory Visit: Payer: Self-pay

## 2021-01-01 ENCOUNTER — Ambulatory Visit (INDEPENDENT_AMBULATORY_CARE_PROVIDER_SITE_OTHER): Payer: PPO | Admitting: Internal Medicine

## 2021-01-01 ENCOUNTER — Ambulatory Visit (HOSPITAL_COMMUNITY)
Admission: RE | Admit: 2021-01-01 | Discharge: 2021-01-01 | Disposition: A | Discharge status: Home / Self Care | Payer: PPO | Source: Ambulatory Visit | Attending: Internal Medicine | Admitting: Internal Medicine

## 2021-01-01 ENCOUNTER — Encounter: Payer: Self-pay | Admitting: Internal Medicine

## 2021-01-01 VITALS — BP 151/82 | HR 78 | Temp 98.6°F | Ht 61.5 in | Wt 171.0 lb

## 2021-01-01 DIAGNOSIS — R2242 Localized swelling, mass and lump, left lower limb: Secondary | ICD-10-CM | POA: Insufficient documentation

## 2021-01-01 DIAGNOSIS — R600 Localized edema: Secondary | ICD-10-CM | POA: Diagnosis not present

## 2021-01-01 DIAGNOSIS — E559 Vitamin D deficiency, unspecified: Secondary | ICD-10-CM | POA: Diagnosis not present

## 2021-01-01 DIAGNOSIS — I10 Essential (primary) hypertension: Secondary | ICD-10-CM | POA: Insufficient documentation

## 2021-01-01 DIAGNOSIS — E785 Hyperlipidemia, unspecified: Secondary | ICD-10-CM | POA: Diagnosis not present

## 2021-01-01 HISTORY — DX: Essential (primary) hypertension: I10

## 2021-01-01 HISTORY — DX: Localized swelling, mass and lump, left lower limb: R22.42

## 2021-01-01 LAB — URIC ACID: Uric Acid, Serum: 4 mg/dL (ref 2.5–7.0)

## 2021-01-01 MED ORDER — LOSARTAN POTASSIUM 25 MG PO TABS: 25.0000 mg | ORAL_TABLET | Freq: Every day | ORAL | 3 refills | Status: DC

## 2021-01-01 NOTE — Assessment & Plan Note (Signed)
Advised to follow low cholesterol diet 

## 2021-01-01 NOTE — Progress Notes (Signed)
Us l 

## 2021-01-01 NOTE — Patient Instructions (Signed)
Please get Ultrasound of the left leg done.  Please start taking Losartan as prescribed.  Please follow low carbohydrate and low salt diet.  Please start taking Vitamin D 1000 IU once in a day.

## 2021-01-01 NOTE — Assessment & Plan Note (Signed)
In the left popliteal area Reports less activity recently Check left LE US Doppler to rule out DVT If negative, could be popliteal cyst

## 2021-01-01 NOTE — Progress Notes (Signed)
Acute Office Visit  Subjective:    Patient ID: Alyssa Durham, female    DOB: 11-18-1951, 70 y.o.   MRN: [id number]  Chief Complaint  Patient presents with  . Knee Pain    L knee pain and swelling x2 weeks    HPI Patient is in today for evaluation of left knee pain and swelling.  She locates it in the left popliteal area and reports that she has been less active recently.  She denies any cold sensation, numbness or weakness in the left foot.  She was recently seen by rheumatologist for arthritis and elevated rheumatoid factor.  Her BP was 151/82 in the office today.  She denies any headache, dizziness, chest pain, dyspnea or palpitations.  Her BP was elevated in the previous visit as well as at rheumatologist office.  Blood tests were reviewed and discussed with the patient in detail.  Past Medical History:  Diagnosis Date  . Allergy   . Anxiety and depression   . Arthritis   . Asthma   . Cataract   . Chronic active type B viral hepatitis ([place])   . Depression   . HLD (hyperlipidemia) 05/12/2017  . Migraines   . Osteoporosis   . Primary hypertension 01/01/2021    Past Surgical History:  Procedure Laterality Date  . CESAREAN SECTION     x3  . LIVER BIOPSY  1993  . URETHRAL DILATION    . WISDOM TOOTH EXTRACTION      Family History  Problem Relation Age of Onset  . Uterine cancer Mother   . Migraines Father   . Early death Father 30       suicide   . Transient ischemic attack Father        multiple   . Migraines Brother   . Healthy Daughter   . Drug abuse Son        addict - stable on suboxone  . Hepatitis B Son   . Healthy Son   . Cancer Maternal Grandmother        breast  . Alcohol abuse Paternal Grandfather   . Early death Paternal Aunt        suicide  . Mental illness Paternal Aunt     Social History   Socioeconomic History  . Marital status: Divorced    Spouse name: Not on file  . Number of children: 3  . Years of education: 18  . Highest  education level: Not on file  Occupational History  . Occupation: retired    Comment: Chiropractor  Tobacco Use  . Smoking status: Former Smoker    Packs/day: 2.00    Years: 5.00    Pack years: 10.00    Types: Cigarettes    Start date: 12/21/1967    Quit date: 12/20/1972    Years since quitting: 48.0  . Smokeless tobacco: Never Used  Vaping Use  . Vaping Use: Never used  Substance and Sexual Activity  . Alcohol use: No  . Drug use: No  . Sexual activity: Not Currently  Other Topics Concern  . Not on file  Social History Narrative   Retired Web designer   Three children  [place], [place] and [employer] - 2   Right handed   [employer]   Social Determinants of Health   Financial Resource Strain: Not on file  Food Insecurity: Not on file  Transportation Needs: Not on file  Physical Activity: Not on file  Stress: Not on file  Social Connections: Not on file  Intimate Partner Violence: Not on file    Outpatient Medications Prior to Visit  Medication Sig Dispense Refill  . albuterol (PROAIR HFA) 108 (90 Base) MCG/ACT inhaler Inhale 2 puffs into the lungs every 6 (six) hours as needed for wheezing or shortness of breath. 18 g 5  . Clobetasol Prop Emollient Base 0.05 % emollient cream Apply topically 2 (two) times daily.    . fluticasone (FLONASE) 50 MCG/ACT nasal spray Place 2 sprays into both nostrils daily. 16 g 6  . levocetirizine (XYZAL) 5 MG tablet Take 1 tablet (5 mg total) by mouth every evening. 30 tablet 5  . RELPAX 40 MG tablet TAKE 1 TABLET BY MOUTH AT ONSET OF MIGRAINE. DO NOT TAKE MORE THAN 3 TABLETS A WEEK 12 tablet 11  . Rimegepant Sulfate (NURTEC) 75 MG TBDP Take 1 tablet by mouth as needed. Take 1 tablet as needed for migraine. Do not take more than 1 in a 24 hour period 10 tablet 4  . tretinoin (RETIN-A) 0.1 % cream Apply topically.     No facility-administered medications prior to visit.    Allergies  Allergen Reactions  . Codeine  Nausea And Vomiting and Other (See Comments)    Review of Systems  Constitutional: Negative for chills and fever.  HENT: Positive for postnasal drip, rhinorrhea and sore throat. Negative for congestion, sinus pressure and sinus pain.   Eyes: Negative for pain and discharge.  Respiratory: Negative for cough and shortness of breath.   Cardiovascular: Negative for chest pain and palpitations.  Gastrointestinal: Negative for abdominal pain, constipation, diarrhea, nausea and vomiting.  Endocrine: Negative for polydipsia and polyuria.  Genitourinary: Negative for dysuria and hematuria.  Musculoskeletal: Positive for arthralgias and back pain. Negative for neck pain and neck stiffness.  Skin: Negative for rash.  Neurological: Negative for dizziness, seizures, syncope and weakness.  Psychiatric/Behavioral: Negative for agitation and behavioral problems.       Objective:    Physical Exam Vitals reviewed.  Constitutional:      General: She is not in acute distress.    Appearance: She is not diaphoretic.  HENT:     Head: Normocephalic and atraumatic.     Nose: Nose normal.     Mouth/Throat:     Mouth: Mucous membranes are moist.  Eyes:     General: No scleral icterus.    Extraocular Movements: Extraocular movements intact.     Pupils: Pupils are equal, round, and reactive to light.  Cardiovascular:     Rate and Rhythm: Normal rate and regular rhythm.     Pulses: Normal pulses.     Heart sounds: No murmur heard.   Pulmonary:     Breath sounds: Normal breath sounds. No wheezing or rales.  Abdominal:     Palpations: Abdomen is soft.     Tenderness: There is no abdominal tenderness.  Musculoskeletal:        General: Swelling (Left popliteal area, no warmth or erythema) present.     Cervical back: Neck supple. No tenderness.     Right lower leg: No edema.  Skin:    General: Skin is warm.     Findings: No rash.  Neurological:     General: No focal deficit present.     Mental  Status: She is alert and oriented to person, place, and time.  Psychiatric:        Mood and Affect: Mood normal.        Behavior: Behavior  normal.     BP (!) 151/82 (BP Location: Right Arm, Patient Position: Sitting, Cuff Size: Normal)   Pulse 78   Temp 98.6 F (37 C) (Temporal)   Ht 5' 1.5" (1.562 m)   Wt 171 lb (77.6 kg)   SpO2 96%   BMI 31.79 kg/m  Wt Readings from Last 3 Encounters:  01/01/21 171 lb (77.6 kg)  12/30/20 172 lb (78 kg)  12/24/20 170 lb 6.4 oz (77.3 kg)    Health Maintenance Due  Topic Date Due  . COLONOSCOPY (Pts 45-39yrs Insurance coverage will need to be confirmed)  Never done  . MAMMOGRAM  10/11/2020    There are no preventive care reminders to display for this patient.   Lab Results  Component Value Date   TSH 1.970 12/25/2020   Lab Results  Component Value Date   WBC 4.5 12/25/2020   HGB 16.3 (H) 12/25/2020   HCT 47.6 (H) 12/25/2020   MCV 91 12/25/2020   PLT 281 12/25/2020   Lab Results  Component Value Date   NA 139 12/25/2020   K 4.3 12/25/2020   CO2 25 12/25/2020   GLUCOSE 90 12/25/2020   BUN 13 12/25/2020   CREATININE 0.76 12/25/2020   BILITOT 0.5 12/25/2020   ALKPHOS 86 12/25/2020   AST 29 12/25/2020   ALT 19 12/25/2020   PROT 7.2 12/25/2020   ALBUMIN 4.5 12/25/2020   CALCIUM 9.5 12/25/2020   Lab Results  Component Value Date   CHOL 235 (H) 12/25/2020   Lab Results  Component Value Date   HDL 69 12/25/2020   Lab Results  Component Value Date   LDLCALC 140 (H) 12/25/2020   Lab Results  Component Value Date   TRIG 149 12/25/2020   Lab Results  Component Value Date   CHOLHDL 3.4 12/25/2020   Lab Results  Component Value Date   HGBA1C 5.3 12/25/2020       Assessment & Plan:   Problem List Items Addressed This Visit      Cardiovascular and Mediastinum   Primary hypertension    BP Readings from Last 1 Encounters:  01/01/21 (!) 151/82   New onset, will start losartan 25 mg once daily Advised to check  BP at home and contact if BP persistently elevated more than 150/90 Counseled for compliance with the medications Advised DASH diet and moderate exercise/walking, at least 150 mins/week       Relevant Medications   losartan (COZAAR) 25 MG tablet     Other   Hyperlipidemia    Advised to follow low-cholesterol diet.      Relevant Medications   losartan (COZAAR) 25 MG tablet   Localized swelling of left lower leg - Primary    In the left popliteal area Reports less activity recently Check left LE US Doppler to rule out DVT If negative, could be popliteal cyst      Relevant Orders   VAS [place] LOWER EXTREMITY VENOUS (DVT)   US Venous Img Lower Unilateral Left   Vitamin D deficiency    Last vitamin D Lab Results  Component Value Date   VD25OH 25.4 (L) 12/25/2020   Advised to take vitamin D 1000 IU once daily           Meds ordered this encounter  Medications  . losartan (COZAAR) 25 MG tablet    Sig: Take 1 tablet (25 mg total) by mouth daily.    Dispense:  30 tablet    Refill:  3  Attila Mccarthy K Mateya Torti, MD 

## 2021-01-01 NOTE — Telephone Encounter (Signed)
Pt made an appt 01-01-21 with Posey Pronto

## 2021-01-01 NOTE — Assessment & Plan Note (Signed)
BP Readings from Last 1 Encounters:  01/01/21 (!) 151/82   New onset, will start losartan 25 mg once daily Advised to check BP at home and contact if BP persistently elevated more than 150/90 Counseled for compliance with the medications Advised DASH diet and moderate exercise/walking, at least 150 mins/week

## 2021-01-01 NOTE — Assessment & Plan Note (Signed)
Last vitamin D Lab Results  Component Value Date   VD25OH 25.4 (L) 12/25/2020   Advised to take vitamin D 1000 IU once daily

## 2021-01-02 ENCOUNTER — Ambulatory Visit (HOSPITAL_COMMUNITY): Payer: PPO

## 2021-01-11 NOTE — Progress Notes (Deleted)
Office Visit Note  Patient: Alyssa Durham             Date of Birth: [birth year]           MRN: [id number]             PCP: Lindell Spar, MD Referring: Celene Squibb, MD Visit Date: 01/21/2021 Occupation: @GUAROCC @  Subjective:  Other (Patient reports hair loss )   History of Present Illness: Alyssa Durham is a 70 y.o. female with history of positive rheumatoid factor and osteoarthritis.  She states she continues to have some stiffness and discomfort in her joints but she denies any joint swelling.  Her main concern today is hair loss.  She states she has had significant hair loss over the last few months.  She has tried all the supplements without much help.  She had extensive work-up by her PCP per patient.  She has a dermatologist whom she was seeing for lichen sclerosis but she has not seen the dermatologist in a long time.  She tried Boniva for 1 month for osteoporosis then stopped it.  She has been taking calcium and vitamin D.  She states she has vitamin D deficiency.  She denies any history of oral ulcers, nasal ulcers, malar rash, photosensitivity, Raynaud's phenomenon or lymphadenopathy.  Activities of Daily Living:  Patient reports morning stiffness for *** {minute/hour:19697}.   Patient {ACTIONS;DENIES/REPORTS:21021675::"Denies"} nocturnal pain.  Difficulty dressing/grooming: {ACTIONS;DENIES/REPORTS:21021675::"Denies"} Difficulty climbing stairs: {ACTIONS;DENIES/REPORTS:21021675::"Denies"} Difficulty getting out of chair: {ACTIONS;DENIES/REPORTS:21021675::"Denies"} Difficulty using hands for taps, buttons, cutlery, and/or writing: {ACTIONS;DENIES/REPORTS:21021675::"Denies"}  No Rheumatology ROS completed.   PMFS History:  Patient Active Problem List   Diagnosis Date Noted  . Localized swelling of left lower leg 01/01/2021  . Primary hypertension 01/01/2021  . Vitamin D deficiency 01/01/2021  . Chronic rhinosinusitis 12/24/2020  . Encounter to establish care  12/24/2020  . Mild intermittent asthma without complication 17/51/0258  . Elevated rheumatoid factor 12/24/2020  . Degeneration of lumbar intervertebral disc 10/28/2020  . Hair loss 09/30/2020  . Low back pain 07/17/2020  . Gluteal tendinitis of right buttock 04/19/2020  . Labral tear of right hip joint 04/19/2020  . Pain in joint of right shoulder 06/29/2018  . History of HPV infection 11/15/2017  . Hyperlipidemia 05/12/2017  . RBBB 05/12/2017  . Calcification of aorta (HCC) 04/04/2017  . Chronic hepatitis B ([place]) 03/29/2017  . Lichen sclerosus of female genitalia 03/29/2017  . Osteoporosis 03/29/2017  . Migraine 08/16/2016    Past Medical History:  Diagnosis Date  . Allergy   . Anxiety and depression   . Arthritis   . Asthma   . Cataract   . Chronic active type B viral hepatitis ([place])   . Depression   . HLD (hyperlipidemia) 05/12/2017  . Migraines   . Osteoporosis   . Primary hypertension 01/01/2021    Family History  Problem Relation Age of Onset  . Uterine cancer Mother   . Migraines Father   . Early death Father 45       suicide   . Transient ischemic attack Father        multiple   . Migraines Brother   . Healthy Daughter   . Drug abuse Son        addict - stable on suboxone  . Hepatitis B Son   . Healthy Son   . Cancer Maternal Grandmother        breast  . Alcohol abuse Paternal Grandfather   . Early  death Paternal Aunt        suicide  . Mental illness Paternal Aunt    Past Surgical History:  Procedure Laterality Date  . CESAREAN SECTION     x3  . LIVER BIOPSY  1993  . URETHRAL DILATION    . WISDOM TOOTH EXTRACTION     Social History   Social History Narrative   Retired Web designer   Three children  [place], [place] and [place] 2   Right handed   [employer] History  Administered Date(s) Administered  . Fluad Quad(high Dose 65+) 09/14/2019  . Influenza,inj,Quad PF,6+ Mos 09/19/2017  .  Influenza,inj,quad, With Preservative 09/19/2017, 09/20/2019  . Influenza-Unspecified 09/19/2018  . Moderna Sars-Covid-2 Vaccination 02/14/2020, 03/13/2020, 11/13/2020  . Pneumococcal Conjugate-13 03/26/2015  . Pneumococcal Polysaccharide-23 07/21/2011, 09/19/2017  . Td 01/15/2011  . Zoster 03/23/2010     Objective: Vital Signs: BP (!) 163/82 (BP Location: Left Arm, Patient Position: Sitting, Cuff Size: Normal)   Pulse 81   Resp 14   Ht 5\' 2"  (1.575 m)   Wt 171 lb 3.2 oz (77.7 kg)   BMI 31.31 kg/m    Physical Exam   Musculoskeletal Exam: ***  CDAI Exam: CDAI Score: -- Patient Global: --; Provider Global: -- Swollen: --; Tender: -- Joint Exam 01/21/2021   No joint exam has been documented for this visit   There is currently no information documented on the homunculus. Go to the Rheumatology activity and complete the homunculus joint exam.  Investigation: No additional findings.  Imaging: US Venous Img Lower Unilateral Left  Result Date: 01/01/2021 CLINICAL DATA:  Edema EXAM: LEFT LOWER EXTREMITY VENOUS DOPPLER ULTRASOUND TECHNIQUE: Gray-scale sonography with compression, as well as color and duplex ultrasound, were performed to evaluate the deep venous system(s) from the level of the common femoral vein through the popliteal and proximal calf veins. COMPARISON:  None. FINDINGS: VENOUS Normal compressibility of the common femoral, superficial femoral, and popliteal veins, as well as the visualized calf veins. Visualized portions of profunda femoral vein and great saphenous vein unremarkable. No filling defects to suggest DVT on grayscale or color Doppler imaging. Doppler waveforms show normal direction of venous flow, normal respiratory plasticity and response to augmentation. Limited views of the contralateral common femoral vein are unremarkable. OTHER None. Limitations: none IMPRESSION: Negative. Electronically Signed   By: Constance Holster M.D.   On: 01/01/2021 16:56   XR  KNEE 3 VIEW LEFT  Result Date: 12/30/2020 Right medial compartment narrowing was noted.  Moderate patellofemoral narrowing was noted.  No chondrocalcinosis was noted. Impression: These findings are consistent moderate osteoarthritis and moderate chondromalacia patella.   Recent Labs: Lab Results  Component Value Date   WBC 4.5 12/25/2020   HGB 16.3 (H) 12/25/2020   PLT 281 12/25/2020   NA 139 12/25/2020   K 4.3 12/25/2020   CL 102 12/25/2020   CO2 25 12/25/2020   GLUCOSE 90 12/25/2020   BUN 13 12/25/2020   CREATININE 0.76 12/25/2020   BILITOT 0.5 12/25/2020   ALKPHOS 86 12/25/2020   AST 29 12/25/2020   ALT 19 12/25/2020   PROT 7.2 12/25/2020   ALBUMIN 4.5 12/25/2020   CALCIUM 9.5 12/25/2020   GFRAA 91 12/25/2020   12/31/19 Uric acid 4.0  06/18/20: RF 157.0, CCP 5.    Speciality Comments: No specialty comments available.  Procedures:  No procedures performed Allergies: Codeine   Assessment / Plan:     Visit Diagnoses: Rheumatoid factor positive -  Alysiana 30, 2021 RF 157, anti-CCP negative.  She had no synovitis on the examination.  Primary osteoarthritis of both hands - She had bilateral DIP thickening.  Pain in right hip - History of right labral tear April 2021.  She had good response to PRP injection given in November 2021 by Dr. Delilah Shan.  Primary osteoarthritis of left knee - Moderate osteoarthritis and moderate chondromalacia patella.  Handout on exercises was given at the last visit.  DDD (degenerative disc disease), lumbar - Currently asymptomatic.  Age-related osteoporosis without current pathological fracture - She tried Boniva for 1 month.  She is on calcium, and vitamin D.  Chronic viral hepatitis B ([place]) - dxd 1993 in [place].  Now she is followed by Dr. Laural Golden in [place].  Mixed hyperlipidemia  Calcification of aorta (HCC)  RBBB  History of non anemic vitamin B12 deficiency  History of anxiety  Hx of migraine with aura  Lichen sclerosus of female  genitalia  History of HPV infection  Hair loss - Plan: ANA  Orders: Orders Placed This Encounter  Procedures  . ANA   No orders of the defined types were placed in this encounter.   Face-to-face time spent with patient was *** minutes. Greater than 50% of time was spent in counseling and coordination of care.  Follow-Up Instructions: Return for Osteoarthritis, Osteoporosis.   Bo Merino, MD  Note - This record has been created using Editor, commissioning.  Chart creation errors have been sought, but may not always  have been located. Such creation errors do not reflect on  the standard of medical care.

## 2021-01-14 ENCOUNTER — Other Ambulatory Visit (INDEPENDENT_AMBULATORY_CARE_PROVIDER_SITE_OTHER): Payer: Self-pay | Admitting: *Deleted

## 2021-01-14 DIAGNOSIS — B1910 Unspecified viral hepatitis B without hepatic coma: Secondary | ICD-10-CM

## 2021-01-14 DIAGNOSIS — R935 Abnormal findings on diagnostic imaging of other abdominal regions, including retroperitoneum: Secondary | ICD-10-CM

## 2021-01-14 DIAGNOSIS — B181 Chronic viral hepatitis B without delta-agent: Secondary | ICD-10-CM

## 2021-01-14 DIAGNOSIS — B161 Acute hepatitis B with delta-agent without hepatic coma: Secondary | ICD-10-CM

## 2021-01-14 DIAGNOSIS — K769 Liver disease, unspecified: Secondary | ICD-10-CM

## 2021-01-20 ENCOUNTER — Ambulatory Visit (INDEPENDENT_AMBULATORY_CARE_PROVIDER_SITE_OTHER): Payer: PPO | Admitting: Internal Medicine

## 2021-01-21 ENCOUNTER — Encounter: Payer: Self-pay | Admitting: Rheumatology

## 2021-01-21 ENCOUNTER — Other Ambulatory Visit: Payer: Self-pay

## 2021-01-21 ENCOUNTER — Ambulatory Visit: Payer: PPO | Admitting: Rheumatology

## 2021-01-21 VITALS — BP 163/82 | HR 81 | Resp 14 | Ht 62.0 in | Wt 171.2 lb

## 2021-01-21 DIAGNOSIS — Z8639 Personal history of other endocrine, nutritional and metabolic disease: Secondary | ICD-10-CM | POA: Diagnosis not present

## 2021-01-21 DIAGNOSIS — Z8669 Personal history of other diseases of the nervous system and sense organs: Secondary | ICD-10-CM

## 2021-01-21 DIAGNOSIS — M19041 Primary osteoarthritis, right hand: Secondary | ICD-10-CM

## 2021-01-21 DIAGNOSIS — Z8619 Personal history of other infectious and parasitic diseases: Secondary | ICD-10-CM

## 2021-01-21 DIAGNOSIS — M1712 Unilateral primary osteoarthritis, left knee: Secondary | ICD-10-CM | POA: Diagnosis not present

## 2021-01-21 DIAGNOSIS — M5136 Other intervertebral disc degeneration, lumbar region: Secondary | ICD-10-CM

## 2021-01-21 DIAGNOSIS — Z8659 Personal history of other mental and behavioral disorders: Secondary | ICD-10-CM

## 2021-01-21 DIAGNOSIS — B181 Chronic viral hepatitis B without delta-agent: Secondary | ICD-10-CM | POA: Diagnosis not present

## 2021-01-21 DIAGNOSIS — E782 Mixed hyperlipidemia: Secondary | ICD-10-CM

## 2021-01-21 DIAGNOSIS — N904 Leukoplakia of vulva: Secondary | ICD-10-CM

## 2021-01-21 DIAGNOSIS — R768 Other specified abnormal immunological findings in serum: Secondary | ICD-10-CM

## 2021-01-21 DIAGNOSIS — M25551 Pain in right hip: Secondary | ICD-10-CM

## 2021-01-21 DIAGNOSIS — M810 Age-related osteoporosis without current pathological fracture: Secondary | ICD-10-CM

## 2021-01-21 DIAGNOSIS — I700 Atherosclerosis of aorta: Secondary | ICD-10-CM | POA: Diagnosis not present

## 2021-01-21 DIAGNOSIS — L659 Nonscarring hair loss, unspecified: Secondary | ICD-10-CM

## 2021-01-21 DIAGNOSIS — M19042 Primary osteoarthritis, left hand: Secondary | ICD-10-CM

## 2021-01-21 DIAGNOSIS — I4510 Unspecified right bundle-branch block: Secondary | ICD-10-CM

## 2021-01-21 NOTE — Progress Notes (Signed)
Office Visit Note  Patient: Alyssa Durham             Date of Birth: 01-11-1951           MRN: [id number]             PCP: Lindell Spar, MD Referring: Celene Squibb, MD Visit Date: 01/21/2021 Occupation: @GUAROCC @  Subjective:  Other (Patient reports hair loss )   History of Present Illness: Alyssa Durham is a 70 y.o. female  History of Present Illness: Alyssa Durham is a 70 y.o. female with history of positive rheumatoid factor and osteoarthritis.  She states she continues to have some stiffness and discomfort in her joints but she denies any joint swelling.  Her main concern today is hair loss.  She states she has had significant hair loss over the last few months.  She has tried all the supplements without much help.  She had extensive work-up by her PCP per patient.  She has a dermatologist whom she was seeing for lichen sclerosis but she has not seen the dermatologist in a long time.  She tried Boniva for 1 month for osteoporosis then stopped it.  She has been taking calcium and vitamin D.  She states she has vitamin D deficiency.  She denies any history of oral ulcers, nasal ulcers, malar rash, photosensitivity, Raynaud's phenomenon or lymphadenopathy.   Activities of Daily Living:  Patient reports morning stiffness for 15 minutes.   Patient Denies nocturnal pain.  Difficulty dressing/grooming: Denies Difficulty climbing stairs: Reports Difficulty getting out of chair: Denies Difficulty using hands for taps, buttons, cutlery, and/or writing: Denies  Review of Systems  Constitutional: Negative for fatigue.  HENT: Negative for mouth sores, mouth dryness and nose dryness.   Eyes: Negative for pain, itching and dryness.  Respiratory: Negative for shortness of breath and difficulty breathing.   Cardiovascular: Negative for chest pain and palpitations.  Gastrointestinal: Negative for blood in stool, constipation and diarrhea.  Endocrine: Negative for increased urination.   Genitourinary: Negative for difficulty urinating.  Musculoskeletal: Positive for morning stiffness. Negative for arthralgias, joint pain, joint swelling, myalgias, muscle tenderness and myalgias.  Skin: Positive for hair loss. Negative for color change, rash and redness.  Allergic/Immunologic: Negative for susceptible to infections.  Neurological: Positive for headaches, memory loss and weakness. Negative for dizziness and numbness.  Hematological: Positive for bruising/bleeding tendency.  Psychiatric/Behavioral: Negative for confusion.    PMFS History:  Patient Active Problem List   Diagnosis Date Noted  . Localized swelling of left lower leg 01/01/2021  . Primary hypertension 01/01/2021  . Vitamin D deficiency 01/01/2021  . Chronic rhinosinusitis 12/24/2020  . Encounter to establish care 12/24/2020  . Mild intermittent asthma without complication 40/98/1191  . Elevated rheumatoid factor 12/24/2020  . Degeneration of lumbar intervertebral disc 10/28/2020  . Hair loss 09/30/2020  . Low back pain 07/17/2020  . Gluteal tendinitis of right buttock 04/19/2020  . Labral tear of right hip joint 04/19/2020  . Pain in joint of right shoulder 06/29/2018  . History of HPV infection 11/15/2017  . Hyperlipidemia 05/12/2017  . RBBB 05/12/2017  . Calcification of aorta (HCC) 04/04/2017  . Chronic hepatitis B ([place]) 03/29/2017  . Lichen sclerosus of female genitalia 03/29/2017  . Osteoporosis 03/29/2017  . Migraine 08/16/2016    Past Medical History:  Diagnosis Date  . Allergy   . Anxiety and depression   . Arthritis   . Asthma   . Cataract   .  Chronic active type B viral hepatitis ([place])   . Depression   . HLD (hyperlipidemia) 05/12/2017  . Migraines   . Osteoporosis   . Primary hypertension 01/01/2021    Family History  Problem Relation Age of Onset  . Uterine cancer Mother   . Migraines Father   . Early death Father 21       suicide   . Transient ischemic attack Father         multiple   . Migraines Brother   . Healthy Daughter   . Drug abuse Son        addict - stable on suboxone  . Hepatitis B Son   . Healthy Son   . Cancer Maternal Grandmother        breast  . Alcohol abuse Paternal Grandfather   . Early death Paternal Aunt        suicide  . Mental illness Paternal Aunt    Past Surgical History:  Procedure Laterality Date  . CESAREAN SECTION     x3  . LIVER BIOPSY  1993  . URETHRAL DILATION    . WISDOM TOOTH EXTRACTION     Social History   Social History Narrative   Retired Web designer   Three children  [place]   Right handed   [employer] History  Administered Date(s) Administered  . Fluad Quad(high Dose 65+) 09/14/2019  . Influenza,inj,Quad PF,6+ Mos 09/19/2017  . Influenza,inj,quad, With Preservative 09/19/2017, 09/20/2019  . Influenza-Unspecified 09/19/2018  . Moderna Sars-Covid-2 Vaccination 02/14/2020, 03/13/2020, 11/13/2020  . Pneumococcal Conjugate-13 03/26/2015  . Pneumococcal Polysaccharide-23 07/21/2011, 09/19/2017  . Td 01/15/2011  . Zoster 03/23/2010     Objective: Vital Signs: BP (!) 163/82 (BP Location: Left Arm, Patient Position: Sitting, Cuff Size: Normal)   Pulse 81   Resp 14   Ht 5\' 2"  (1.575 m)   Wt 171 lb 3.2 oz (77.7 kg)   BMI 31.31 kg/m    Physical Exam Vitals and nursing note reviewed.  Constitutional:      Appearance: She is well-developed and well-nourished.  HENT:     Head: Normocephalic and atraumatic.  Eyes:     Extraocular Movements: EOM normal.     Conjunctiva/sclera: Conjunctivae normal.  Cardiovascular:     Rate and Rhythm: Normal rate and regular rhythm.     Pulses: Intact distal pulses.     Heart sounds: Normal heart sounds.  Pulmonary:     Effort: Pulmonary effort is normal.     Breath sounds: Normal breath sounds.  Abdominal:     General: Bowel sounds are normal.     Palpations: Abdomen is soft.  Musculoskeletal:      Cervical back: Normal range of motion.  Lymphadenopathy:     Cervical: No cervical adenopathy.  Skin:    General: Skin is warm and dry.     Capillary Refill: Capillary refill takes less than 2 seconds.  Neurological:     Mental Status: She is alert and oriented to person, place, and time.  Psychiatric:        Mood and Affect: Mood and affect normal.        Behavior: Behavior normal.      Musculoskeletal Exam: C-spine was in good range of motion.  Shoulders, elbows, wrist joints, MCPs with good range of motion with no synovitis.  She has PIP and DIP thickening consistent with osteoarthritis.  She had good range of motion of her hip  joints and knee joints without any warmth swelling or effusion.  There was no tenderness over ankles or MTPs.  CDAI Exam: CDAI Score: -- Patient Global: --; Provider Global: -- Swollen: --; Tender: -- Joint Exam 01/21/2021   No joint exam has been documented for this visit   There is currently no information documented on the homunculus. Go to the Rheumatology activity and complete the homunculus joint exam.  Investigation: No additional findings.  Imaging: US Venous Img Lower Unilateral Left  Result Date: 01/01/2021 CLINICAL DATA:  Edema EXAM: LEFT LOWER EXTREMITY VENOUS DOPPLER ULTRASOUND TECHNIQUE: Gray-scale sonography with compression, as well as color and duplex ultrasound, were performed to evaluate the deep venous system(s) from the level of the common femoral vein through the popliteal and proximal calf veins. COMPARISON:  None. FINDINGS: VENOUS Normal compressibility of the common femoral, superficial femoral, and popliteal veins, as well as the visualized calf veins. Visualized portions of profunda femoral vein and great saphenous vein unremarkable. No filling defects to suggest DVT on grayscale or color Doppler imaging. Doppler waveforms show normal direction of venous flow, normal respiratory plasticity and response to augmentation. Limited  views of the contralateral common femoral vein are unremarkable. OTHER None. Limitations: none IMPRESSION: Negative. Electronically Signed   By: Constance Holster M.D.   On: 01/01/2021 16:56   XR KNEE 3 VIEW LEFT  Result Date: 12/30/2020 Right medial compartment narrowing was noted.  Moderate patellofemoral narrowing was noted.  No chondrocalcinosis was noted. Impression: These findings are consistent moderate osteoarthritis and moderate chondromalacia patella.   Recent Labs: Lab Results  Component Value Date   WBC 4.5 12/25/2020   HGB 16.3 (H) 12/25/2020   PLT 281 12/25/2020   NA 139 12/25/2020   K 4.3 12/25/2020   CL 102 12/25/2020   CO2 25 12/25/2020   GLUCOSE 90 12/25/2020   BUN 13 12/25/2020   CREATININE 0.76 12/25/2020   BILITOT 0.5 12/25/2020   ALKPHOS 86 12/25/2020   AST 29 12/25/2020   ALT 19 12/25/2020   PROT 7.2 12/25/2020   ALBUMIN 4.5 12/25/2020   CALCIUM 9.5 12/25/2020   GFRAA 91 12/25/2020   December 30, 2020 uric acid 4.0 Speciality Comments: No specialty comments available.  Procedures:  No procedures performed Allergies: Codeine   Assessment / Plan:     Visit Diagnoses: Rheumatoid factor positive - Elasia 30, 2021 RF 157, anti-CCP negative.  She had no synovitis on the examination.  Patient gives history of positive rheumatoid factor for many years.  It may be related to positive hepatitis B.  Hair loss -she states she had extensive work-up in the past and no cause of hair loss was established.  She has been trying supplements which were not effective.  I will check ANA today.  She has no history of oral ulcers, nasal ulcers, malar rash, photosensitivity, Raynaud's, lymphadenopathy or inflammatory arthritis.  Plan: ANA.  We will call her with the results once available.  I also advised her to schedule an appoint with a dermatologist.  Primary osteoarthritis of both hands - She had bilateral DIP thickening.  Joint protection was discussed.  Pain in right hip  - History of right labral tear April 2021.  She had good response to PRP injection given in November 2021 by Dr. Delilah Shan.  Primary osteoarthritis of left knee - Moderate osteoarthritis and moderate chondromalacia patella.  Handout on exercises was given at the last visit.  She has been trying to do some of the exercises.  She states  the knee joint pain has improved.  DDD (degenerative disc disease), lumbar - Currently asymptomatic.  Age-related osteoporosis without current pathological fracture - She tried Boniva for 1 month.  She is on calcium, and vitamin D.  I advised her to restart Boniva and discuss this further with Dr. Posey Pronto.  Chronic viral hepatitis B ([place]) - dxd 1993 in [place].  Now she is followed by Dr. Laural Golden in [place].  Mixed hyperlipidemia  Calcification of aorta (HCC)  RBBB  History of non anemic vitamin B12 deficiency  History of anxiety  Hx of migraine with aura  Lichen sclerosus of female genitalia  History of HPV infection  Orders: Orders Placed This Encounter  Procedures  . ANA   No orders of the defined types were placed in this encounter.    Follow-Up Instructions: Return if symptoms worsen or fail to improve, for Osteoarthritis, Osteoporosis.   Bo Merino, MD  Note - This record has been created using Editor, commissioning.  Chart creation errors have been sought, but may not always  have been located. Such creation errors do not reflect on  the standard of medical care.

## 2021-01-23 LAB — ANA: Anti Nuclear Antibody (ANA): NEGATIVE

## 2021-01-23 NOTE — Progress Notes (Signed)
ANA is negative.

## 2021-02-05 DIAGNOSIS — M25551 Pain in right hip: Secondary | ICD-10-CM | POA: Diagnosis not present

## 2021-02-16 DIAGNOSIS — B1910 Unspecified viral hepatitis B without hepatic coma: Secondary | ICD-10-CM | POA: Diagnosis not present

## 2021-02-16 DIAGNOSIS — B181 Chronic viral hepatitis B without delta-agent: Secondary | ICD-10-CM | POA: Diagnosis not present

## 2021-02-16 DIAGNOSIS — R935 Abnormal findings on diagnostic imaging of other abdominal regions, including retroperitoneum: Secondary | ICD-10-CM | POA: Diagnosis not present

## 2021-02-16 DIAGNOSIS — K769 Liver disease, unspecified: Secondary | ICD-10-CM | POA: Diagnosis not present

## 2021-02-17 ENCOUNTER — Encounter (INDEPENDENT_AMBULATORY_CARE_PROVIDER_SITE_OTHER): Payer: Self-pay | Admitting: Internal Medicine

## 2021-02-17 ENCOUNTER — Ambulatory Visit (INDEPENDENT_AMBULATORY_CARE_PROVIDER_SITE_OTHER): Payer: PPO | Admitting: Internal Medicine

## 2021-02-17 ENCOUNTER — Other Ambulatory Visit: Payer: Self-pay

## 2021-02-17 VITALS — BP 114/73 | HR 99 | Temp 98.5°F | Ht 62.0 in | Wt 171.1 lb

## 2021-02-17 DIAGNOSIS — B181 Chronic viral hepatitis B without delta-agent: Secondary | ICD-10-CM | POA: Diagnosis not present

## 2021-02-17 NOTE — Progress Notes (Signed)
Presenting complaint;  Follow for chronic hepatitis B.  Database and subjective:  Patient is 70 year old Caucasian female who has chronic hepatitis B and is here for scheduled visit.  She was last seen on 08/19/2020. She was diagnosed with hepatitis B back in 1993 on routine blood work.  Subsequently 2 out of 3 her children were also positive.  She was living in [place] at the time.  She had liver biopsy and and was treated with interferon but did not clear the virus.  Fiber sure test in May 2017 suggested F1/F2 disease. She had ultrasound with elastography in September 2021.  Median kPa score was 5.9.  Echotexture suggested chronic liver disease with questionable subtle contour change. Hepatitis B E antigen was negative and E antibody was positive.  Hepatitis delta antibody was negative. HBV DNA titer by PCR was 540 0 IU/mL.  She has no complaints.  She remains with good appetite.  She denies abdominal pain nausea vomiting melena or rectal bleeding.  Bowels move daily.  She says she had a terrible time walking because of right hip pain.  She had plasma infusion on 11/14/2020 and noted remarkable relief of her pain within a month and now she is able to walk.  She has lost 4 pounds since her last visit.   Current Medications: Outpatient Encounter Medications as of 02/17/2021  Medication Sig  . albuterol (PROAIR HFA) 108 (90 Base) MCG/ACT inhaler Inhale 2 puffs into the lungs every 6 (six) hours as needed for wheezing or shortness of breath.  . Clobetasol Prop Emollient Base 0.05 % emollient cream Apply topically 2 (two) times daily.  . fluticasone (FLONASE) 50 MCG/ACT nasal spray Place 2 sprays into both nostrils daily.  Marland Kitchen levocetirizine (XYZAL) 5 MG tablet Take 1 tablet (5 mg total) by mouth every evening.  Marland Kitchen RELPAX 40 MG tablet TAKE 1 TABLET BY MOUTH AT ONSET OF MIGRAINE. DO NOT TAKE MORE THAN 3 TABLETS A WEEK  . Rimegepant Sulfate (NURTEC) 75 MG TBDP Take 1 tablet by mouth as needed. Take  1 tablet as needed for migraine. Do not take more than 1 in a 24 hour period  . tretinoin (RETIN-A) 0.1 % cream Apply topically. Patient states that she uses sporadic   No facility-administered encounter medications on file as of 02/17/2021.     Objective: Blood pressure 114/73, pulse 99, temperature 98.5 F (36.9 C), temperature source Oral, height 5' 2"  (1.575 m), weight 171 lb 1.6 oz (77.6 kg). Patient is alert and in no acute distress. Conjunctiva is pink. Sclera is nonicteric Oropharyngeal mucosa is normal. No neck masses or thyromegaly noted. Cardiac exam with regular rhythm normal S1 and S2. No murmur or gallop noted. Lungs are clear to auscultation. Abdomen is full but soft and nontender with organomegaly or masses. No LE edema or clubbing noted.  Labs/studies Results:  CBC Latest Ref Rng & Units 12/25/2020 07/16/2020 08/11/2017  WBC 3.4 - 10.8 x10E3/uL 4.5 3.9 4.8  Hemoglobin 11.1 - 15.9 g/dL 16.3(H) 15.9(H) 15.0  Hematocrit 34.0 - 46.6 % 47.6(H) 48.2(H) 44.5  Platelets 150 - 450 x10E3/uL 281 243 278    CMP Latest Ref Rng & Units 02/16/2021 12/25/2020 07/16/2020  Glucose 65 - 99 mg/dL - 90 82  BUN 8 - 27 mg/dL - 13 21  Creatinine 0.57 - 1.00 mg/dL - 0.76 0.92  Sodium 134 - 144 mmol/L - 139 141  Potassium 3.5 - 5.2 mmol/L - 4.3 4.3  Chloride 96 - 106 mmol/L - 102 103  CO2 20 - 29 mmol/L - 25 26  Calcium 8.7 - 10.3 mg/dL - 9.5 9.0  Total Protein 6.1 - 8.1 g/dL 6.7 7.2 6.8  Total Bilirubin 0.2 - 1.2 mg/dL 0.6 0.5 0.8  Alkaline Phos 44 - 121 IU/L - 86 -  AST 10 - 35 U/L 29 29 27   ALT 6 - 29 U/L 23 19 32(H)    Hepatic Function Latest Ref Rng & Units 02/16/2021 12/25/2020 07/16/2020  Total Protein 6.1 - 8.1 g/dL 6.7 7.2 6.8  Albumin 3.7 - 4.7 g/dL - 4.5 -  AST 10 - 35 U/L 29 29 27   ALT 6 - 29 U/L 23 19 32(H)  Alk Phosphatase 44 - 121 IU/L - 86 -  Total Bilirubin 0.2 - 1.2 mg/dL 0.6 0.5 0.8  Bilirubin, Direct 0.0 - 0.2 mg/dL 0.1 - -    Alpha-fetoprotein 4.6.  HBV DNA by PCR  pending.  Ultrasound on 08/19/2020 Coarsened echogenicity of liver suggesting parenchymal disease and questionable subtle contour nodularity.  Elastography  Median kPA 5.9  Assessment:  #1.  Chronic hepatitis B.  Remote therapy with interferon 29 years ago was ineffective.  She has low viral titer(HBV DNA) but it is trending upwards.  Result from 2 days ago are pending.  Since she is hepatitis B surface antigen negative she may be a candidate for antiviral therapy based on AASLD guidelines.  Since ultrasound raises the possibility of cirrhosis liver biopsy to restage her disease would not be unreasonable.  Plan:  Proceed with abdominal ultrasound. Wait for results of HBV DNA by PCR. Further recommendations to follow. Office visit in 6 months.

## 2021-02-17 NOTE — Patient Instructions (Signed)
Physician will call with result of pending blood work and [place] when completed

## 2021-02-18 LAB — HEPATIC FUNCTION PANEL
AG Ratio: 1.7 (calc) (ref 1.0–2.5)
ALT: 23 U/L (ref 6–29)
AST: 29 U/L (ref 10–35)
Albumin: 4.2 g/dL (ref 3.6–5.1)
Alkaline phosphatase (APISO): 72 U/L (ref 37–153)
Bilirubin, Direct: 0.1 mg/dL (ref 0.0–0.2)
Globulin: 2.5 g/dL (calc) (ref 1.9–3.7)
Indirect Bilirubin: 0.5 mg/dL (calc) (ref 0.2–1.2)
Total Bilirubin: 0.6 mg/dL (ref 0.2–1.2)
Total Protein: 6.7 g/dL (ref 6.1–8.1)

## 2021-02-18 LAB — AFP TUMOR MARKER: AFP-Tumor Marker: 4.6 ng/mL

## 2021-02-18 LAB — HEPATITIS B DNA, ULTRAQUANTITATIVE, PCR
Hepatitis B DNA (Calc): 2.57 Log IU/mL — ABNORMAL HIGH
Hepatitis B DNA: 373 IU/mL — ABNORMAL HIGH

## 2021-02-27 ENCOUNTER — Ambulatory Visit (HOSPITAL_COMMUNITY)
Admission: RE | Admit: 2021-02-27 | Discharge: 2021-02-27 | Disposition: A | Discharge status: Home / Self Care | Payer: PPO | Source: Ambulatory Visit | Attending: Internal Medicine | Admitting: Internal Medicine

## 2021-02-27 DIAGNOSIS — B181 Chronic viral hepatitis B without delta-agent: Secondary | ICD-10-CM | POA: Insufficient documentation

## 2021-02-27 DIAGNOSIS — B1910 Unspecified viral hepatitis B without hepatic coma: Secondary | ICD-10-CM | POA: Diagnosis not present

## 2021-02-27 DIAGNOSIS — K7689 Other specified diseases of liver: Secondary | ICD-10-CM | POA: Diagnosis not present

## 2021-03-10 ENCOUNTER — Telehealth: Payer: Self-pay | Admitting: Neurology

## 2021-03-10 NOTE — Telephone Encounter (Signed)
Patient states that her headaches are getting more and more often and they are bad. She is not doing the injections but doing the other medication. She made appt to see Dr Delice Lesch in Dec and is on the wait list. Patient would like to try the new medication Qulipta  Please call

## 2021-03-10 NOTE — Telephone Encounter (Signed)
Please see .thanks.

## 2021-03-11 ENCOUNTER — Other Ambulatory Visit (INDEPENDENT_AMBULATORY_CARE_PROVIDER_SITE_OTHER): Payer: Self-pay | Admitting: *Deleted

## 2021-03-11 DIAGNOSIS — K769 Liver disease, unspecified: Secondary | ICD-10-CM

## 2021-03-11 DIAGNOSIS — B181 Chronic viral hepatitis B without delta-agent: Secondary | ICD-10-CM

## 2021-03-13 ENCOUNTER — Telehealth: Payer: Self-pay

## 2021-03-13 DIAGNOSIS — K769 Liver disease, unspecified: Secondary | ICD-10-CM | POA: Diagnosis not present

## 2021-03-13 DIAGNOSIS — B181 Chronic viral hepatitis B without delta-agent: Secondary | ICD-10-CM | POA: Diagnosis not present

## 2021-03-13 NOTE — Telephone Encounter (Signed)
See MyChart message to patient

## 2021-03-13 NOTE — Telephone Encounter (Signed)
Medication Samples have been provided to the patient.  Drug name: qulipta       Strength: 60mg        Qty:4 boxs  LOT: [id number]  Exp.Date: 10/2022  Dosing instructions: take one tablet daily   The patient has been instructed regarding the correct time, dose, and frequency of taking this medication, including desired effects and most common side effects.   Jake Seats LPN  10:68 AM 1[birth year]66/1969

## 2021-03-17 LAB — HEPATITIS B E ANTIGEN: Hep B E Ag: NONREACTIVE

## 2021-03-17 LAB — HEPATITIS B E ANTIBODY: Hep B E Ab: REACTIVE — AB

## 2021-03-17 LAB — HEPATITIS B SURFACE ANTIGEN: Hepatitis B Surface Ag: REACTIVE — AB

## 2021-03-18 ENCOUNTER — Other Ambulatory Visit (INDEPENDENT_AMBULATORY_CARE_PROVIDER_SITE_OTHER): Payer: Self-pay | Admitting: *Deleted

## 2021-03-18 DIAGNOSIS — K769 Liver disease, unspecified: Secondary | ICD-10-CM

## 2021-03-18 DIAGNOSIS — B181 Chronic viral hepatitis B without delta-agent: Secondary | ICD-10-CM

## 2021-03-18 NOTE — Progress Notes (Signed)
Patient has called the office back. She would like to go ahead and make the appointment to have the ultrasound-guided liver biopsy done. She would like to have it in [place] if possible. If not [place] is fine. She is going to call a cousin to see if they can drive her.

## 2021-03-18 NOTE — Progress Notes (Signed)
Order place, [facility] will contact patient to schedule

## 2021-03-23 MED ORDER — QULIPTA 60 MG PO TABS: 1.0000 | ORAL_TABLET | Freq: Every day | ORAL | 11 refills | Status: DC

## 2021-03-23 NOTE — Addendum Note (Signed)
Addended by: Cameron Sprang on: 03/23/2021 03:06 PM   Modules accepted: Orders

## 2021-03-26 ENCOUNTER — Encounter: Payer: Self-pay | Admitting: Neurology

## 2021-03-26 NOTE — Progress Notes (Signed)
Keilly Nicoletti Key: [id number] - PA Case ID: [id number] Need help? Call us at [phone] Outcome Approvedtoday PA Case: [id number], Status: Approved, Coverage Starts on: 03/26/2021 12:00:00 AM, Coverage Ends on: 12/19/2021 12:00:00 AM. Drug Qulipta 60MG  tablets Form [employer] 4-Part Electronic [place] Form [id number] NCPDP)

## 2021-03-27 ENCOUNTER — Other Ambulatory Visit: Payer: Self-pay | Admitting: Student

## 2021-03-30 ENCOUNTER — Encounter (HOSPITAL_COMMUNITY): Payer: Self-pay

## 2021-03-30 ENCOUNTER — Ambulatory Visit (HOSPITAL_COMMUNITY)
Admission: RE | Admit: 2021-03-30 | Discharge: 2021-03-30 | Disposition: A | Discharge status: Home / Self Care | Payer: PPO | Source: Ambulatory Visit | Attending: Internal Medicine | Admitting: Internal Medicine

## 2021-03-30 ENCOUNTER — Other Ambulatory Visit: Payer: Self-pay

## 2021-03-30 DIAGNOSIS — K769 Liver disease, unspecified: Secondary | ICD-10-CM | POA: Diagnosis present

## 2021-03-30 DIAGNOSIS — Z79899 Other long term (current) drug therapy: Secondary | ICD-10-CM | POA: Insufficient documentation

## 2021-03-30 DIAGNOSIS — B1910 Unspecified viral hepatitis B without hepatic coma: Secondary | ICD-10-CM | POA: Diagnosis not present

## 2021-03-30 DIAGNOSIS — Z87891 Personal history of nicotine dependence: Secondary | ICD-10-CM | POA: Insufficient documentation

## 2021-03-30 DIAGNOSIS — B181 Chronic viral hepatitis B without delta-agent: Secondary | ICD-10-CM

## 2021-03-30 DIAGNOSIS — K7402 Hepatic fibrosis, advanced fibrosis: Secondary | ICD-10-CM | POA: Insufficient documentation

## 2021-03-30 DIAGNOSIS — B179 Acute viral hepatitis, unspecified: Secondary | ICD-10-CM | POA: Diagnosis not present

## 2021-03-30 DIAGNOSIS — K739 Chronic hepatitis, unspecified: Secondary | ICD-10-CM | POA: Diagnosis not present

## 2021-03-30 LAB — CBC
HCT: 46.9 % — ABNORMAL HIGH (ref 36.0–46.0)
Hemoglobin: 15.2 g/dL — ABNORMAL HIGH (ref 12.0–15.0)
MCH: 30.6 pg (ref 26.0–34.0)
MCHC: 32.4 g/dL (ref 30.0–36.0)
MCV: 94.4 fL (ref 80.0–100.0)
Platelets: 256 10*3/uL (ref 150–400)
RBC: 4.97 MIL/uL (ref 3.87–5.11)
RDW: 13.5 % (ref 11.5–15.5)
WBC: 4.2 10*3/uL (ref 4.0–10.5)
nRBC: 0 % (ref 0.0–0.2)

## 2021-03-30 LAB — PROTIME-INR
INR: 1.1 (ref 0.8–1.2)
Prothrombin Time: 14.2 seconds (ref 11.4–15.2)

## 2021-03-30 MED ORDER — SODIUM CHLORIDE 0.9 % IV SOLN: INTRAVENOUS | Status: DC

## 2021-03-30 MED ORDER — FENTANYL CITRATE (PF) 100 MCG/2ML IJ SOLN
INTRAMUSCULAR | Status: AC | PRN
  Administered 2021-03-30 (×2): 25 ug via INTRAVENOUS

## 2021-03-30 MED ORDER — MIDAZOLAM HCL 2 MG/2ML IJ SOLN
INTRAMUSCULAR | Status: AC | PRN
  Administered 2021-03-30: 1 mg via INTRAVENOUS

## 2021-03-30 MED ORDER — FENTANYL CITRATE (PF) 100 MCG/2ML IJ SOLN
INTRAMUSCULAR | Status: AC
  Filled 2021-03-30: qty 2

## 2021-03-30 MED ORDER — GELATIN ABSORBABLE 12-7 MM EX MISC
CUTANEOUS | Status: AC
  Filled 2021-03-30: qty 1

## 2021-03-30 MED ORDER — LIDOCAINE HCL (PF) 1 % IJ SOLN
INTRAMUSCULAR | Status: AC
  Filled 2021-03-30: qty 30

## 2021-03-30 MED ORDER — MIDAZOLAM HCL 2 MG/2ML IJ SOLN
INTRAMUSCULAR | Status: AC
  Filled 2021-03-30: qty 2

## 2021-03-30 NOTE — Procedures (Signed)
Interventional Radiology Procedure Note  Procedure: US guided biopsy of liver, medical liver biopsy Complications: None EBL: None Recommendations: - Bedrest 2 hours.   - Routine wound care - Follow up pathology - Advance diet   Signed,  Corrie Mckusick, DO

## 2021-03-30 NOTE — Discharge Instructions (Signed)
Liver Biopsy, Care After These instructions give you information on caring for yourself after your procedure. Your doctor may also give you more specific instructions. Call your doctor if you have any problems or questions after your procedure. What can I expect after the procedure? After the procedure, it is common to have:  Pain and soreness where the biopsy was done.  Bruising around the area where the biopsy was done.  Sleepiness and be tired for a few days. Follow these instructions at home: Medicines  Take over-the-counter and prescription medicines only as told by your doctor.  If you were prescribed an antibiotic medicine, take it as told by your doctor. Do not stop taking the antibiotic even if you start to feel better.  Do not take medicines such as aspirin and ibuprofen. These medicines can thin your blood. Do not take these medicines unless your doctor tells you to take them.  If you are taking prescription pain medicine, take actions to prevent or treat constipation. Your doctor may recommend that you: ? Drink enough fluid to keep your pee (urine) clear or pale yellow. ? Take over-the-counter or prescription medicines. ? Eat foods that are high in fiber, such as fresh fruits and vegetables, whole grains, and beans. ? Limit foods that are high in fat and processed sugars, such as fried and sweet foods. Caring for your cut  Follow instructions from your doctor about how to take care of your cuts from surgery (incisions). Make sure you: ? Wash your hands with soap and water before you change your bandage (dressing). If you cannot use soap and water, use hand sanitizer. ? Change your bandage as told by your doctor. ? Leave stitches (sutures), skin glue, or skin tape (adhesive) strips in place. They may need to stay in place for 2 weeks or longer. If tape strips get loose and curl up, you may trim the loose edges. Do not remove tape strips completely unless your doctor says it is  okay.  Check your cuts every day for signs of infection. Check for: ? Redness, swelling, or more pain. ? Fluid or blood. ? Pus or a bad smell. ? Warmth.  Do not take baths, swim, or use a hot tub until your doctor says it is okay to do so. Activity  Rest at home for 1-2 days or as told by your doctor. ? Avoid sitting for a long time without moving. Get up to take short walks every 1-2 hours.  Return to your normal activities as told by your doctor. Ask what activities are safe for you.  Do not do these things in the first 24 hours: ? Drive. ? Use machinery. ? Take a bath or shower.  Do not lift more than 10 pounds (4.5 kg) or play contact sports for the first 2 weeks.   General instructions  Do not drink alcohol in the first week after the procedure.  Have someone stay with you for at least 24 hours after the procedure.  Get your test results. Ask your doctor or the department that is doing the test: ? When will my results be ready? ? How will I get my results? ? What are my treatment options? ? What other tests do I need? ? What are my next steps?  Keep all follow-up visits as told by your doctor. This is important.   Contact a doctor if:  A cut bleeds and leaves more than just a small spot of blood.  A cut is red,   puffs up (swells), or hurts more than before.  Fluid or something else comes from a cut.  A cut smells bad.  You have a fever or chills. Get help right away if:  You have swelling, bloating, or pain in your belly (abdomen).  You get dizzy or faint.  You have a rash.  You feel sick to your stomach (nauseous) or throw up (vomit).  You have trouble breathing, feel short of breath, or feel faint.  Your chest hurts.  You have problems talking or seeing.  You have trouble with your balance or moving your arms or legs. Summary  After the procedure, it is common to have pain, soreness, bruising, and tiredness.  Your doctor will tell you how to  take care of yourself at home. Change your bandage, take your medicines, and limit your activities as told by your doctor.  Call your doctor if you have symptoms of infection. Get help right away if your belly swells, your cut bleeds a lot, or you have trouble talking or breathing. This information is not intended to replace advice given to you by your health care provider. Make sure you discuss any questions you have with your health care provider. Document Revised: 12/15/2017 Document Reviewed: 12/16/2017 Elsevier Patient Education  2021 [employer]  

## 2021-03-30 NOTE — H&P (Signed)
Chief Complaint: Patient was seen in consultation today for random liver biopsy at the request of Rehman,Najeeb U  Referring Physician(s): Rehman,Najeeb U  Supervising Physician: Corrie Mckusick  Patient Status: [facility] - Out-pt  History of Present Illness: Alyssa Durham is a 70 y.o. female   Hep B known since 62- lived in [place] 2 of her children born positive (so Dx was before then really)  Was treated with Interferon - but Virus not cleared [place] Elastography Sep 2021: Median kPa score was 5.9.  Echotexture suggested chronic liver disease with questionable subtle contour change. Hepatitis B E antigen was negative and E antibody was positive.  Hepatitis delta antibody was negative. HBV DNA titer by PCR was 540 0 IU/mL  Follows with Dr Dereck Leep in [place] Note 02/17/21:  #1.  Chronic hepatitis B.  Remote therapy with interferon 29 years ago was ineffective.  She has low viral titer(HBV DNA) but it is trending upwards.  Result from 2 days ago are pending.  Since she is hepatitis B surface antigen negative she may be a candidate for antiviral therapy based on AASLD guidelines.  Since ultrasound raises the possibility of cirrhosis liver biopsy to restage her disease would not be unreasonable. Plan: Proceed with abdominal ultrasound. Wait for results of HBV DNA by PCR. Further recommendations to follow. Office visit in 6 months  02/27/21: [place]:  IMPRESSION: Coarsened, increased echotexture throughout the liver compatible with fatty infiltration or intrinsic liver disease. Subtle nodularity suggests early changes of cirrhosis. Findings are stable since prior study.  Scheduled for random liver biopsy today   Past Medical History:  Diagnosis Date  . Allergy   . Anxiety and depression   . Arthritis   . Asthma   . Cataract   . Chronic active type B viral hepatitis ([place])   . Depression   . HLD (hyperlipidemia) 05/12/2017  . Migraines   . Osteoporosis   . Primary hypertension  01/01/2021    Past Surgical History:  Procedure Laterality Date  . CESAREAN SECTION     x3  . LIVER BIOPSY  1993  . URETHRAL DILATION    . WISDOM TOOTH EXTRACTION      Allergies: Codeine  Medications: Prior to Admission medications   Medication Sig Start Date End Date Taking? Authorizing Provider  albuterol (PROAIR HFA) 108 (90 Base) MCG/ACT inhaler Inhale 2 puffs into the lungs every 6 (six) hours as needed for wheezing or shortness of breath. 12/24/20  Yes Patel, Colin Broach, MD  Atogepant (QULIPTA) 60 MG TABS Take 1 tablet by mouth daily. 03/23/21  Yes [place] Sprang, MD  cholecalciferol (VITAMIN D3) 25 MCG (1000 UNIT) tablet Take 1,000 Units by mouth daily.   Yes [provider]  Coenzyme Q10 (COQ10) 100 MG CAPS Take 100 mg by mouth daily.   Yes [provider]  fluticasone (FLONASE) 50 MCG/ACT nasal spray Place 2 sprays into both nostrils daily. Patient taking differently: Place 2 sprays into both nostrils daily as needed for allergies. 12/24/20  Yes Lindell Spar, MD  levocetirizine (XYZAL) 5 MG tablet Take 1 tablet (5 mg total) by mouth every evening. 12/24/20  Yes Lindell Spar, MD  magnesium oxide (MAG-OX) 400 MG tablet Take 400 mg by mouth daily.   Yes [provider]  Multiple Vitamins-Minerals (HAIR/SKIN/NAILS/BIOTIN PO) Take 1 each by mouth in the morning, at noon, and at bedtime.   Yes [provider]  RELPAX 40 MG tablet TAKE 1 TABLET BY MOUTH AT ONSET  OF MIGRAINE. DO NOT TAKE MORE THAN 3 TABLETS A WEEK Patient taking differently: Take 40 mg by mouth every 2 (two) hours as needed for migraine. TAKE 1 TABLET BY MOUTH AT ONSET OF MIGRAINE. DO NOT TAKE MORE THAN 3 TABLETS A WEEK 12/29/20  Yes [place] Sprang, MD  tretinoin (RETIN-A) 0.1 % cream Apply 1 application topically at bedtime as needed. Patient states that she uses sporadic 08/18/20  Yes [provider]  vitamin C (ASCORBIC ACID) 500 MG tablet Take 500 mg by mouth daily.   Yes  [provider]  Clobetasol Prop Emollient Base 0.05 % emollient cream Apply 1 application topically 2 (two) times daily as needed (irritation).    [provider]  Rimegepant Sulfate (NURTEC) 75 MG TBDP Take 1 tablet by mouth as needed. Take 1 tablet as needed for migraine. Do not take more than 1 in a 24 hour period Patient taking differently: Take 75 mg by mouth as needed (migraine). Take 1 tablet as needed for migraine. Do not take more than 1 in a 24 hour period 06/03/20   [place] Sprang, MD     Family History  Problem Relation Age of Onset  . Uterine cancer Mother   . Migraines Father   . Early death Father 32       suicide   . Transient ischemic attack Father        multiple   . Migraines Brother   . Healthy Daughter   . Drug abuse Son        addict - stable on suboxone  . Hepatitis B Son   . Healthy Son   . Cancer Maternal Grandmother        breast  . Alcohol abuse Paternal Grandfather   . Early death Paternal Aunt        suicide  . Mental illness Paternal Aunt     Social History   Socioeconomic History  . Marital status: Divorced    Spouse name: Not on file  . Number of children: 3  . Years of education: 1  . Highest education level: Not on file  Occupational History  . Occupation: retired    Comment: Chiropractor  Tobacco Use  . Smoking status: Former Smoker    Packs/day: 2.00    Years: 5.00    Pack years: 10.00    Types: Cigarettes    Start date: 12/21/1967    Quit date: 12/20/1972    Years since quitting: 48.3  . Smokeless tobacco: Never Used  Vaping Use  . Vaping Use: Never used  Substance and Sexual Activity  . Alcohol use: No  . Drug use: No  . Sexual activity: Not Currently  Other Topics Concern  . Not on file  Social History Narrative   Retired Web designer   Three children  [place], [place] and [employer] - 2   Right handed   [employer]   Social Determinants of Health   Financial Resource  Strain: Not on file  Food Insecurity: Not on file  Transportation Needs: Not on file  Physical Activity: Not on file  Stress: Not on file  Social Connections: Not on file   Review of Systems: A 12 point ROS discussed and pertinent positives are indicated in the HPI above.  All other systems are negative.  Review of Systems  Constitutional: Negative for activity change, appetite change, fatigue and fever.  Respiratory: Negative for cough and shortness of breath.   Cardiovascular: Negative  for chest pain.  Gastrointestinal: Negative for abdominal pain, diarrhea, nausea and vomiting.  Musculoskeletal: Negative for back pain.  Psychiatric/Behavioral: Negative for behavioral problems and confusion.    Vital Signs: BP 130/74   Pulse 84   Temp 98.1 F (36.7 C) (Oral)   Resp 16   Ht 5\' 2"  (1.575 m)   Wt 169 lb (76.7 kg)   SpO2 100%   BMI 30.91 kg/m   Physical Exam Vitals reviewed.  HENT:     Mouth/Throat:     Mouth: Mucous membranes are moist.  Eyes:     General: No scleral icterus. Cardiovascular:     Rate and Rhythm: Regular rhythm.     Heart sounds: Normal heart sounds.  Pulmonary:     Effort: Pulmonary effort is normal.     Breath sounds: Normal breath sounds.  Abdominal:     Palpations: Abdomen is soft.     Tenderness: There is no abdominal tenderness.  Skin:    General: Skin is warm.  Neurological:     Mental Status: She is alert and oriented to person, place, and time.  Psychiatric:        Behavior: Behavior normal.     Imaging: No results found.  Labs:  CBC: Recent Labs    07/16/20 1132 12/25/20 1126  WBC 3.9 4.5  HGB 15.9* 16.3*  HCT 48.2* 47.6*  PLT 243 281    COAGS: Recent Labs    07/16/20 1132  INR 1.1    BMP: Recent Labs    07/16/20 1132 12/25/20 1126  NA 141 139  K 4.3 4.3  CL 103 102  CO2 26 25  GLUCOSE 82 90  BUN 21 13  CALCIUM 9.0 9.5  CREATININE 0.92 0.76  GFRNONAA 63 79  GFRAA 73 91    LIVER FUNCTION  TESTS: Recent Labs    07/16/20 1132 12/25/20 1126 02/16/21 0920  BILITOT 0.8 0.5 0.6  AST 27 29 29   ALT 32* 19 23  ALKPHOS  --  86  --   PROT 6.8 7.2 6.7  ALBUMIN  --  4.5  --     TUMOR MARKERS: Recent Labs    07/16/20 1132 02/16/21 0920  AFPTM 4.1 4.6    Assessment and Plan:  Hep B 1993-- ineffective Interferon treatment Follows with Dr Dereck Leep For liver core biopsy today Risks and benefits of liver core biopsy was discussed with the patient and/or patient's family including, but not limited to bleeding, infection, damage to adjacent structures or low yield requiring additional tests.  All of the questions were answered and there is agreement to proceed. Consent signed and in chart.  Thank you for this interesting consult.  I greatly enjoyed meeting Legend Somers Locken and look forward to participating in their care.  A copy of this report was sent to the requesting provider on this date.  Electronically Signed: Lavonia Drafts, PA-C 03/30/2021, 12:33 PM   I spent a total of  30 Minutes   in face to face in clinical consultation, greater than 50% of which was counseling/coordinating care for liver core bx

## 2021-04-01 LAB — SURGICAL PATHOLOGY

## 2021-04-02 ENCOUNTER — Telehealth (INDEPENDENT_AMBULATORY_CARE_PROVIDER_SITE_OTHER): Payer: Self-pay | Admitting: Internal Medicine

## 2021-04-02 ENCOUNTER — Other Ambulatory Visit (INDEPENDENT_AMBULATORY_CARE_PROVIDER_SITE_OTHER): Payer: Self-pay | Admitting: Internal Medicine

## 2021-04-02 MED ORDER — ENTECAVIR 0.5 MG PO TABS: 0.5000 mg | ORAL_TABLET | Freq: Every day | ORAL | 5 refills | Status: DC

## 2021-04-02 NOTE — Telephone Encounter (Signed)
Patient called stated she received her liver biopsy results by mychart - states she is very concerned about what she read - would like to speak to Dr Laural Golden about the results - please advise - ph# [phone]

## 2021-04-02 NOTE — Telephone Encounter (Signed)
Patient's call returned. Not sure why I have not received liver biopsy results in my result box. At a rate I reviewed the biopsy results with the patient. She has chronic active hepatitis B and she also has stage III/stage IV fibrosis.  She remains with compensated hepatic function. We will start her on Entecavir 0.5 mg daily.  She does not want to take generic preparation. Patient advised to call office if she has any side effects.  Patient will have CBC, comprehensive chemistry panel, hepatitis B surface antigen and HBV DNA by PCR in 3 months followed by office visit with me.

## 2021-04-02 NOTE — Telephone Encounter (Signed)
I spoke with Dr. Laural Golden he will call the patient and discuss with her.

## 2021-04-06 NOTE — Telephone Encounter (Signed)
Reminder to repeat labs in 3 months has been placed

## 2021-04-07 ENCOUNTER — Telehealth (INDEPENDENT_AMBULATORY_CARE_PROVIDER_SITE_OTHER): Payer: Self-pay

## 2021-04-07 ENCOUNTER — Other Ambulatory Visit (INDEPENDENT_AMBULATORY_CARE_PROVIDER_SITE_OTHER): Payer: Self-pay

## 2021-04-07 DIAGNOSIS — B181 Chronic viral hepatitis B without delta-agent: Secondary | ICD-10-CM

## 2021-04-07 NOTE — Telephone Encounter (Signed)
Patient called today to ask if she could have a baseline HepB Dna Replication done at [facility] prior to starting her antiviral. She states she is aware it was denied and I made her aware we did send an appeal, we would let her know if approved or not. I advised that we did ask for it to be expedited and I would notify her of the determination. She states she get assistance with her medication even if the med is not approved by the insurance.

## 2021-04-07 NOTE — Telephone Encounter (Signed)
Dr. Laural Golden agreed to this. I have sent the order to [facility]. Patient aware and states she will go on 04/08/2021 to have this drawn.

## 2021-04-08 DIAGNOSIS — B181 Chronic viral hepatitis B without delta-agent: Secondary | ICD-10-CM | POA: Diagnosis not present

## 2021-04-10 LAB — HEPATITIS B DNA, ULTRAQUANTITATIVE, PCR
Hepatitis B DNA (Calc): 3.29 Log IU/mL — ABNORMAL HIGH
Hepatitis B DNA: 1950 IU/mL — ABNORMAL HIGH

## 2021-04-14 ENCOUNTER — Other Ambulatory Visit (INDEPENDENT_AMBULATORY_CARE_PROVIDER_SITE_OTHER): Payer: Self-pay

## 2021-04-14 ENCOUNTER — Telehealth (INDEPENDENT_AMBULATORY_CARE_PROVIDER_SITE_OTHER): Payer: Self-pay

## 2021-04-14 DIAGNOSIS — I10 Essential (primary) hypertension: Secondary | ICD-10-CM

## 2021-04-14 DIAGNOSIS — K769 Liver disease, unspecified: Secondary | ICD-10-CM

## 2021-04-14 DIAGNOSIS — B1910 Unspecified viral hepatitis B without hepatic coma: Secondary | ICD-10-CM

## 2021-04-14 DIAGNOSIS — Z1159 Encounter for screening for other viral diseases: Secondary | ICD-10-CM

## 2021-04-14 DIAGNOSIS — B181 Chronic viral hepatitis B without delta-agent: Secondary | ICD-10-CM

## 2021-04-14 NOTE — Telephone Encounter (Signed)
Patient aware of all and she is scheduled to see Dr. Laural Golden in August she is aware to have labs drawn around 07/14/2021 prior to the appointment her with Dr. Laural Golden.

## 2021-04-14 NOTE — Telephone Encounter (Signed)
I spoke with Dr. Laural Golden he says yes patient needs to adhere to the directions as much as possible. Also ok to draw labs   Patient called today to report she will beginning her Sherren Kerns today and was instructed to call the office and tell us when she will begin so we can arrange her lab draw for three months from now. She also wanted to know if she had to follow the directions of the medications to take on an empty stomach ot to take 2 hours prior to a meal.

## 2021-04-14 NOTE — Telephone Encounter (Signed)
Labs ordered for [facility] around 07/14/2021. They have been mailed to the patient .

## 2021-04-14 NOTE — Telephone Encounter (Signed)
Labs are to be done prior to her appt here we will send patient a reminder for around July 26,2022. Per Dr. Laural Golden she will need a CMET,HBSAG,CBC,Hep B Dna ultraquant at that time at [facility].

## 2021-04-23 ENCOUNTER — Other Ambulatory Visit: Payer: Self-pay | Admitting: Internal Medicine

## 2021-04-23 ENCOUNTER — Ambulatory Visit (INDEPENDENT_AMBULATORY_CARE_PROVIDER_SITE_OTHER): Payer: PPO | Admitting: Internal Medicine

## 2021-04-23 ENCOUNTER — Other Ambulatory Visit: Payer: Self-pay

## 2021-04-23 ENCOUNTER — Encounter: Payer: Self-pay | Admitting: Internal Medicine

## 2021-04-23 VITALS — BP 127/80 | HR 71 | Wt 167.0 lb

## 2021-04-23 DIAGNOSIS — F19982 Other psychoactive substance use, unspecified with psychoactive substance-induced sleep disorder: Secondary | ICD-10-CM | POA: Diagnosis not present

## 2021-04-23 DIAGNOSIS — E785 Hyperlipidemia, unspecified: Secondary | ICD-10-CM | POA: Diagnosis not present

## 2021-04-23 DIAGNOSIS — I10 Essential (primary) hypertension: Secondary | ICD-10-CM | POA: Diagnosis not present

## 2021-04-23 DIAGNOSIS — M810 Age-related osteoporosis without current pathological fracture: Secondary | ICD-10-CM

## 2021-04-23 DIAGNOSIS — Z1231 Encounter for screening mammogram for malignant neoplasm of breast: Secondary | ICD-10-CM

## 2021-04-23 DIAGNOSIS — G43819 Other migraine, intractable, without status migrainosus: Secondary | ICD-10-CM

## 2021-04-23 DIAGNOSIS — B181 Chronic viral hepatitis B without delta-agent: Secondary | ICD-10-CM | POA: Diagnosis not present

## 2021-04-23 DIAGNOSIS — J4520 Mild intermittent asthma, uncomplicated: Secondary | ICD-10-CM

## 2021-04-23 DIAGNOSIS — J329 Chronic sinusitis, unspecified: Secondary | ICD-10-CM

## 2021-04-23 DIAGNOSIS — L659 Nonscarring hair loss, unspecified: Secondary | ICD-10-CM

## 2021-04-23 MED ORDER — IBANDRONATE SODIUM 150 MG PO TABS: 150.0000 mg | ORAL_TABLET | ORAL | 3 refills | Status: DC

## 2021-04-23 MED ORDER — ZOLPIDEM TARTRATE 5 MG PO TABS: 5.0000 mg | ORAL_TABLET | Freq: Every evening | ORAL | 1 refills | Status: DC | PRN

## 2021-04-23 NOTE — Patient Instructions (Signed)
Please start taking Boniva and Caltrate for osteoporosis.  Please start taking Ambien for insomnia.  Please maintain simple sleep hygiene. - Maintain dark and non-noisy environment in the bedroom. - Please use the bedroom for sleep and sexual activity only. - Do not use electronic devices in the bedroom. - Please take dinner at least 2 hours before bedtime. - Please avoid caffeinated products in the evening, including coffee, soft drinks. - Please try to maintain the regular sleep-wake cycle - Go to bed and wake up at the same time.  Please continue to take other medications as prescribed.

## 2021-04-23 NOTE — Assessment & Plan Note (Signed)
BP Readings from Last 1 Encounters:  03/30/21 128/73   Well-controlled with diet modification and increased activity Had component of white coat hypertension Advised DASH diet and moderate exercise/walking, at least 150 mins/week

## 2021-04-23 NOTE — Assessment & Plan Note (Signed)
Last DEXA scan reviewed Patient is ready to start Boniva now Start Calcium and Vitamin D supplement (Caltrate)

## 2021-04-23 NOTE — Assessment & Plan Note (Signed)
On Qulipta and Relpax Follows up with Neurology 

## 2021-04-23 NOTE — Assessment & Plan Note (Signed)
Uses Albuterol PRN Likely related to allergies

## 2021-04-23 NOTE — Assessment & Plan Note (Signed)
Advised to follow low cholesterol diet 

## 2021-04-23 NOTE — Progress Notes (Signed)
Established Patient Office Visit  Subjective:  Patient ID: Alyssa Durham, female    DOB: 03-Jan-1951  Age: 70 y.o. MRN: [id number]  CC:  Chief Complaint  Patient presents with  . Hyperlipidemia    Follow up visit   . Alopecia    Started losing hair last nov and has lost over half her hair. States its getting some better now     HPI Alyssa Durham is  a 70 year old female with past medical history of chronic migraine, mild intermittent asthma related to allergies, osteoporosis, chronic hep B, lichen sclerosus, and gluteal tendinitis of right buttock who presents for follow up of her chronic medical conditions.  Her BP was wnl today. She had stopped taking Losartan. She denies any chest pain, dyspnea or palpitations.  Migraine: Well-controlled with Lenoria Chime now. Follows up with Neurologist.  Chronic Hep B: Was recently started on Entecavir after checking DNA titer and liver biopsy. She has been having nightmares after starting it. She has been taking her old Clonazepam medication.  Osteoporosis: Only taking Vitamin D currently. Was started on Boniva in the past, but did not like it as she had to avoid food for an hour. She is willing to start it now. She is going to start taking Caltrate as well.  She is more active and ambulating well since she had PRP injection in 10/2020. She has been trying to lose weight as well. She requests to be scheduled for Mammography.  Past Medical History:  Diagnosis Date  . Allergy   . Anxiety and depression   . Arthritis   . Asthma   . Cataract   . Chronic active type B viral hepatitis ([place])   . Depression   . History of HPV infection 11/15/2017  . HLD (hyperlipidemia) 05/12/2017  . Localized swelling of left lower leg 01/01/2021  . Migraines   . Osteoporosis   . Primary hypertension 01/01/2021    Past Surgical History:  Procedure Laterality Date  . CESAREAN SECTION     x3  . LIVER BIOPSY  1993  . URETHRAL DILATION    . WISDOM TOOTH  EXTRACTION      Family History  Problem Relation Age of Onset  . Uterine cancer Mother   . Migraines Father   . Early death Father 81       suicide   . Transient ischemic attack Father        multiple   . Migraines Brother   . Healthy Daughter   . Drug abuse Son        addict - stable on suboxone  . Hepatitis B Son   . Healthy Son   . Cancer Maternal Grandmother        breast  . Alcohol abuse Paternal Grandfather   . Early death Paternal Aunt        suicide  . Mental illness Paternal Aunt     Social History   Socioeconomic History  . Marital status: Divorced    Spouse name: Not on file  . Number of children: 3  . Years of education: 73  . Highest education level: Not on file  Occupational History  . Occupation: retired    Comment: Chiropractor  Tobacco Use  . Smoking status: Former Smoker    Packs/day: 2.00    Years: 5.00    Pack years: 10.00    Types: Cigarettes    Start date: 12/21/1967    Quit date: 12/20/1972    Years since  quitting: 48.3  . Smokeless tobacco: Never Used  Vaping Use  . Vaping Use: Never used  Substance and Sexual Activity  . Alcohol use: No  . Drug use: No  . Sexual activity: Not Currently  Other Topics Concern  . Not on file  Social History Narrative   Retired Web designer   Three children  [place], [place] and [employer] - 2   Right handed   [employer]   Social Determinants of Health   Financial Resource Strain: Not on file  Food Insecurity: Not on file  Transportation Needs: Not on file  Physical Activity: Not on file  Stress: Not on file  Social Connections: Not on file  Intimate Partner Violence: Not on file    Outpatient Medications Prior to Visit  Medication Sig Dispense Refill  . albuterol (PROAIR HFA) 108 (90 Base) MCG/ACT inhaler Inhale 2 puffs into the lungs every 6 (six) hours as needed for wheezing or shortness of breath. 18 g 5  . Atogepant (QULIPTA) 60 MG TABS Take 1 tablet by mouth  daily. 30 tablet 11  . cholecalciferol (VITAMIN D3) 25 MCG (1000 UNIT) tablet Take 1,000 Units by mouth daily.    . Clobetasol Prop Emollient Base 0.05 % emollient cream Apply 1 application topically 2 (two) times daily as needed (irritation).    . Coenzyme Q10 (COQ10) 100 MG CAPS Take 100 mg by mouth daily.    Marland Kitchen entecavir (BARACLUDE) 0.5 MG tablet Take 1 tablet (0.5 mg total) by mouth daily. 30 tablet 5  . fluticasone (FLONASE) 50 MCG/ACT nasal spray Place 2 sprays into both nostrils daily. (Patient taking differently: Place 2 sprays into both nostrils daily as needed for allergies.) 16 g 6  . levocetirizine (XYZAL) 5 MG tablet Take 1 tablet (5 mg total) by mouth every evening. 30 tablet 5  . magnesium oxide (MAG-OX) 400 MG tablet Take 400 mg by mouth daily.    . Multiple Vitamins-Minerals (HAIR/SKIN/NAILS/BIOTIN PO) Take 1 each by mouth in the morning, at noon, and at bedtime.    . RELPAX 40 MG tablet TAKE 1 TABLET BY MOUTH AT ONSET OF MIGRAINE. DO NOT TAKE MORE THAN 3 TABLETS A WEEK (Patient taking differently: Take 40 mg by mouth every 2 (two) hours as needed for migraine. TAKE 1 TABLET BY MOUTH AT ONSET OF MIGRAINE. DO NOT TAKE MORE THAN 3 TABLETS A WEEK) 12 tablet 11  . tretinoin (RETIN-A) 0.1 % cream Apply 1 application topically at bedtime as needed. Patient states that she uses sporadic    . Rimegepant Sulfate (NURTEC) 75 MG TBDP Take 1 tablet by mouth as needed. Take 1 tablet as needed for migraine. Do not take more than 1 in a 24 hour period (Patient taking differently: Take 75 mg by mouth as needed (migraine). Take 1 tablet as needed for migraine. Do not take more than 1 in a 24 hour period) 10 tablet 4  . vitamin C (ASCORBIC ACID) 500 MG tablet Take 500 mg by mouth daily.     No facility-administered medications prior to visit.    Allergies  Allergen Reactions  . Codeine Nausea And Vomiting and Other (See Comments)    ROS Review of Systems  Constitutional: Negative for chills and  fever.  HENT: Negative for congestion, postnasal drip, rhinorrhea, sinus pressure, sinus pain and sore throat.   Eyes: Negative for pain and discharge.  Respiratory: Negative for cough and shortness of breath.   Cardiovascular: Negative for chest pain and palpitations.  Gastrointestinal: Negative for abdominal pain, constipation, diarrhea, nausea and vomiting.  Endocrine: Negative for polydipsia and polyuria.  Genitourinary: Negative for dysuria and hematuria.  Musculoskeletal: Positive for arthralgias and back pain. Negative for neck pain and neck stiffness.  Skin: Negative for rash.  Neurological: Negative for dizziness, seizures, syncope and weakness.  Psychiatric/Behavioral: Negative for agitation and behavioral problems.      Objective:    Physical Exam Vitals reviewed.  Constitutional:      General: She is not in acute distress.    Appearance: She is not diaphoretic.  HENT:     Head: Normocephalic and atraumatic.     Nose: Nose normal. No congestion.     Mouth/Throat:     Mouth: Mucous membranes are moist.     Pharynx: No posterior oropharyngeal erythema.  Eyes:     General: No scleral icterus.    Extraocular Movements: Extraocular movements intact.  Cardiovascular:     Rate and Rhythm: Normal rate and regular rhythm.     Pulses: Normal pulses.     Heart sounds: Normal heart sounds. No murmur heard.   Pulmonary:     Breath sounds: Normal breath sounds. No wheezing or rales.  Abdominal:     Palpations: Abdomen is soft.     Tenderness: There is no abdominal tenderness.  Musculoskeletal:     Cervical back: Neck supple. No tenderness.     Right lower leg: No edema.     Left lower leg: No edema.  Skin:    General: Skin is warm.     Findings: No rash.  Neurological:     General: No focal deficit present.     Mental Status: She is alert and oriented to person, place, and time.  Psychiatric:        Mood and Affect: Mood normal.        Behavior: Behavior normal.      There were no vitals taken for this visit. Wt Readings from Last 3 Encounters:  03/30/21 169 lb (76.7 kg)  02/17/21 171 lb 1.6 oz (77.6 kg)  01/21/21 171 lb 3.2 oz (77.7 kg)     Health Maintenance Due  Topic Date Due  . COLONOSCOPY (Pts 45-72yrs Insurance coverage will need to be confirmed)  Never done  . MAMMOGRAM  10/11/2020  . TETANUS/TDAP  01/15/2021    There are no preventive care reminders to display for this patient.  Lab Results  Component Value Date   TSH 1.970 12/25/2020   Lab Results  Component Value Date   WBC 4.2 03/30/2021   HGB 15.2 (H) 03/30/2021   HCT 46.9 (H) 03/30/2021   MCV 94.4 03/30/2021   PLT 256 03/30/2021   Lab Results  Component Value Date   NA 139 12/25/2020   K 4.3 12/25/2020   CO2 25 12/25/2020   GLUCOSE 90 12/25/2020   BUN 13 12/25/2020   CREATININE 0.76 12/25/2020   BILITOT 0.6 02/16/2021   ALKPHOS 86 12/25/2020   AST 29 02/16/2021   ALT 23 02/16/2021   PROT 6.7 02/16/2021   ALBUMIN 4.5 12/25/2020   CALCIUM 9.5 12/25/2020   Lab Results  Component Value Date   CHOL 235 (H) 12/25/2020   Lab Results  Component Value Date   HDL 69 12/25/2020   Lab Results  Component Value Date   LDLCALC 140 (H) 12/25/2020   Lab Results  Component Value Date   TRIG 149 12/25/2020   Lab Results  Component Value Date   CHOLHDL 3.4 12/25/2020  Lab Results  Component Value Date   HGBA1C 5.3 12/25/2020      Assessment & Plan:   Problem List Items Addressed This Visit      Cardiovascular and Mediastinum   Migraine    On Qulipta and Relpax Follows up with Neurology      Primary hypertension    BP Readings from Last 1 Encounters:  03/30/21 128/73   Well-controlled with diet modification and increased activity Had component of white coat hypertension Advised DASH diet and moderate exercise/walking, at least 150 mins/week         Respiratory   Mild intermittent asthma without complication - Primary    Uses Albuterol  PRN Likely related to allergies        Digestive   Chronic hepatitis B ([place])    Recent rise in Hep B DNA titer Follows up with GI Had liver biopsy recently, was started on Entecavir        Musculoskeletal and Integument   Osteoporosis    Last DEXA scan reviewed Patient is ready to start Boniva now Start Calcium and Vitamin D supplement (Caltrate)      Relevant Medications   ibandronate (BONIVA) 150 MG tablet     Other   Hyperlipidemia    Advised to follow low-cholesterol diet.      Hair loss    Better with vitamin supplement Avoid Minoxidil while undergoing Hep B management      Drug-induced insomnia (HCC)   Relevant Medications   zolpidem (AMBIEN) 5 MG tablet    Other Visit Diagnoses    Visit for screening mammogram       Relevant Orders   MM 3D SCREEN BREAST BILATERAL      Meds ordered this encounter  Medications  . zolpidem (AMBIEN) 5 MG tablet    Sig: Take 1 tablet (5 mg total) by mouth at bedtime as needed for sleep.    Dispense:  30 tablet    Refill:  1  . ibandronate (BONIVA) 150 MG tablet    Sig: Take 1 tablet (150 mg total) by mouth every 30 (thirty) days. Take in the morning with a full glass of water, on an empty stomach, and do not take anything else by mouth or lie down for the next 30 min.    Dispense:  3 tablet    Refill:  3    Follow-up: Return in about 5 months (around 09/23/2021) for Annual physical.    Lindell Spar, MD

## 2021-04-23 NOTE — Assessment & Plan Note (Signed)
Better with vitamin supplement Avoid Minoxidil while undergoing Hep B management

## 2021-04-23 NOTE — Assessment & Plan Note (Signed)
Recent rise in Hep B DNA titer Follows up with GI Had liver biopsy recently, was started on Entecavir

## 2021-05-07 ENCOUNTER — Ambulatory Visit (HOSPITAL_COMMUNITY)
Admission: RE | Admit: 2021-05-07 | Discharge: 2021-05-07 | Disposition: A | Discharge status: Home / Self Care | Payer: PPO | Source: Ambulatory Visit | Attending: Internal Medicine | Admitting: Internal Medicine

## 2021-05-07 DIAGNOSIS — Z1231 Encounter for screening mammogram for malignant neoplasm of breast: Secondary | ICD-10-CM | POA: Diagnosis not present

## 2021-05-11 ENCOUNTER — Ambulatory Visit (INDEPENDENT_AMBULATORY_CARE_PROVIDER_SITE_OTHER): Payer: PPO

## 2021-05-11 ENCOUNTER — Other Ambulatory Visit (INDEPENDENT_AMBULATORY_CARE_PROVIDER_SITE_OTHER): Payer: Self-pay | Admitting: Internal Medicine

## 2021-05-11 ENCOUNTER — Other Ambulatory Visit: Payer: Self-pay

## 2021-05-11 ENCOUNTER — Ambulatory Visit
Admission: EM | Admit: 2021-05-11 | Discharge: 2021-05-11 | Disposition: A | Discharge status: Home / Self Care | Payer: PPO | Attending: Family Medicine | Admitting: Family Medicine

## 2021-05-11 DIAGNOSIS — M1712 Unilateral primary osteoarthritis, left knee: Secondary | ICD-10-CM

## 2021-05-11 DIAGNOSIS — M25562 Pain in left knee: Secondary | ICD-10-CM

## 2021-05-11 MED ORDER — PREDNISONE 20 MG PO TABS: 40.0000 mg | ORAL_TABLET | Freq: Every day | ORAL | 0 refills | Status: AC

## 2021-05-11 NOTE — ED Provider Notes (Signed)
[facility]    CSN: [id number] Arrival date & time: 05/11/21  1153      History   Chief Complaint Chief Complaint  Patient presents with  . Knee Pain    HPI Alyssa Durham is a 70 y.o. female.   Reports left knee pain for the last day.  Reports that she was walking around and then all of a sudden is having sharp medial left knee pain.  Has not attempted OTC treatment.  Denies known injury.  Pain is worse with activity.  Denies radiating pain, bruising, erythema, numbness, tingling, other symptoms.  ROS per HPI  The history is provided by the patient.    Past Medical History:  Diagnosis Date  . Allergy   . Anxiety and depression   . Arthritis   . Asthma   . Cataract   . Chronic active type B viral hepatitis ([place])   . Depression   . History of HPV infection 11/15/2017  . HLD (hyperlipidemia) 05/12/2017  . Localized swelling of left lower leg 01/01/2021  . Migraines   . Osteoporosis   . Primary hypertension 01/01/2021    Patient Active Problem List   Diagnosis Date Noted  . Drug-induced insomnia ([place]) 04/23/2021  . Primary hypertension 01/01/2021  . Vitamin D deficiency 01/01/2021  . Chronic rhinosinusitis 12/24/2020  . Mild intermittent asthma without complication 92/42/6834  . Elevated rheumatoid factor 12/24/2020  . Degeneration of lumbar intervertebral disc 10/28/2020  . Hair loss 09/30/2020  . Low back pain 07/17/2020  . Gluteal tendinitis of right buttock 04/19/2020  . Labral tear of right hip joint 04/19/2020  . Pain in joint of right shoulder 06/29/2018  . Hyperlipidemia 05/12/2017  . RBBB 05/12/2017  . Calcification of aorta (HCC) 04/04/2017  . Chronic hepatitis B ([place]) 03/29/2017  . Lichen sclerosus of female genitalia 03/29/2017  . Osteoporosis 03/29/2017  . Migraine 08/16/2016    Past Surgical History:  Procedure Laterality Date  . CESAREAN SECTION     x3  . LIVER BIOPSY  1993  . URETHRAL DILATION    . WISDOM TOOTH EXTRACTION       OB History   No obstetric history on file.      Home Medications    Prior to Admission medications   Medication Sig Start Date End Date Taking? Authorizing Provider  predniSONE (DELTASONE) 20 MG tablet Take 2 tablets (40 mg total) by mouth daily with breakfast for 5 days. 05/11/21 05/16/21 Yes Faustino Congress, NP  albuterol [facility] HFA) 108 (90 Base) MCG/ACT inhaler Inhale 2 puffs into the lungs every 6 (six) hours as needed for wheezing or shortness of breath. 12/24/20   Lindell Spar, MD  Atogepant (QULIPTA) 60 MG TABS Take 1 tablet by mouth daily. 03/23/21   Cameron Sprang, MD  BARACLUDE 0.5 MG tablet TAKE 1 TABLET(0.5 MG) BY MOUTH DAILY 05/11/21   Rehman, Mechele Dawley, MD  cholecalciferol (VITAMIN D3) 25 MCG (1000 UNIT) tablet Take 1,000 Units by mouth daily.    [provider]  Clobetasol Prop Emollient Base 0.05 % emollient cream Apply 1 application topically 2 (two) times daily as needed (irritation).    [provider]  Coenzyme Q10 (COQ10) 100 MG CAPS Take 100 mg by mouth daily.    [provider]  fluticasone (FLONASE) 50 MCG/ACT nasal spray Place 2 sprays into both nostrils daily. Patient taking differently: Place 2 sprays into both nostrils daily as needed for allergies. 12/24/20   Lindell Spar, MD  ibandronate (BONIVA) 150 MG tablet Take 1 tablet (150 mg total) by mouth every 30 (thirty) days. Take in the morning with a full glass of water, on an empty stomach, and do not take anything else by mouth or lie down for the next 30 min. 04/23/21   Lindell Spar, MD  levocetirizine (XYZAL) 5 MG tablet TAKE 1 TABLET(5 MG) BY MOUTH EVERY EVENING 04/23/21   Lindell Spar, MD  magnesium oxide (MAG-OX) 400 MG tablet Take 400 mg by mouth daily.    [provider]  Multiple Vitamins-Minerals (HAIR/SKIN/NAILS/BIOTIN PO) Take 1 each by mouth in the morning, at noon, and at bedtime.    [provider]  RELPAX 40 MG tablet TAKE 1 TABLET BY MOUTH AT  ONSET OF MIGRAINE. DO NOT TAKE MORE THAN 3 TABLETS A WEEK Patient taking differently: Take 40 mg by mouth every 2 (two) hours as needed for migraine. TAKE 1 TABLET BY MOUTH AT ONSET OF MIGRAINE. DO NOT TAKE MORE THAN 3 TABLETS A WEEK 12/29/20   Cameron Sprang, MD  tretinoin (RETIN-A) 0.1 % cream Apply 1 application topically at bedtime as needed. Patient states that she uses sporadic 08/18/20   [provider]  zolpidem (AMBIEN) 5 MG tablet Take 1 tablet (5 mg total) by mouth at bedtime as needed for sleep. 04/23/21   Lindell Spar, MD    Family History Family History  Problem Relation Age of Onset  . Uterine cancer Mother   . Migraines Father   . Early death Father 55       suicide   . Transient ischemic attack Father        multiple   . Migraines Brother   . Healthy Daughter   . Drug abuse Son        addict - stable on suboxone  . Hepatitis B Son   . Healthy Son   . Cancer Maternal Grandmother        breast  . Alcohol abuse Paternal Grandfather   . Early death Paternal Aunt        suicide  . Mental illness Paternal Aunt     Social History Social History   Tobacco Use  . Smoking status: Former Smoker    Packs/day: 2.00    Years: 5.00    Pack years: 10.00    Types: Cigarettes    Start date: 12/21/1967    Quit date: 12/20/1972    Years since quitting: 48.4  . Smokeless tobacco: Never Used  Vaping Use  . Vaping Use: Never used  Substance Use Topics  . Alcohol use: No  . Drug use: No     Allergies   Codeine   Review of Systems Review of Systems   Physical Exam Triage Vital Signs ED Triage Vitals  Enc Vitals Group     BP 05/11/21 1457 136/86     Pulse Rate 05/11/21 1457 80     Resp 05/11/21 1457 17     Temp 05/11/21 1457 97.8 F (36.6 C)     Temp Source 05/11/21 1457 Oral     SpO2 05/11/21 1457 96 %     Weight --      Height --      Head Circumference --      Peak Flow --      Pain Score 05/11/21 1502 8     Pain Loc --      Pain Edu? --       Excl. in  GC? --    No data found.  Updated Vital Signs BP 136/86 (BP Location: Left Arm)   Pulse 80   Temp 97.8 F (36.6 C) (Oral)   Resp 17   SpO2 96%   Visual Acuity Right Eye Distance:   Left Eye Distance:   Bilateral Distance:    Right Eye Near:   Left Eye Near:    Bilateral Near:     Physical Exam Vitals and nursing note reviewed.  Constitutional:      General: She is not in acute distress.    Appearance: Normal appearance. She is well-developed.  HENT:     Head: Normocephalic and atraumatic.     Nose: Nose normal.     Mouth/Throat:     Mouth: Mucous membranes are moist.     Pharynx: Oropharynx is clear.  Eyes:     Extraocular Movements: Extraocular movements intact.     Conjunctiva/sclera: Conjunctivae normal.     Pupils: Pupils are equal, round, and reactive to light.  Cardiovascular:     Rate and Rhythm: Normal rate and regular rhythm.  Pulmonary:     Effort: Pulmonary effort is normal.  Musculoskeletal:        General: Swelling and tenderness present.     Cervical back: Normal range of motion and neck supple.     Comments: Mild swelling and tenderness noted to medial left knee.  No erythema, bruising, drainage, wounds noted  Skin:    General: Skin is warm and dry.     Capillary Refill: Capillary refill takes less than 2 seconds.  Neurological:     General: No focal deficit present.     Mental Status: She is alert and oriented to person, place, and time.  Psychiatric:        Mood and Affect: Mood normal.        Behavior: Behavior normal.        Thought Content: Thought content normal.      UC Treatments / Results  Labs (all labs ordered are listed, but only abnormal results are displayed) Labs Reviewed - No data to display  EKG   Radiology No results found.  Procedures Procedures (including critical care time)  Medications Ordered in UC Medications - No data to display  Initial Impression / Assessment and Plan / UC Course  I have  reviewed the triage vital signs and the nursing notes.  Pertinent labs & imaging results that were available during my care of the patient were reviewed by me and considered in my medical decision making (see chart for details).    Left knee pain Osteoarthritis  X-ray today shows osteoarthritis with minimal tibial spurring Prednisone taper prescribed Discussed with patient because of her pain May take Tylenol and ibuprofen as needed for pain May wear brace as needed Follow-up with orthopedics if symptoms are persisting   Final Clinical Impressions(s) / UC Diagnoses   Final diagnoses:  Acute pain of left knee  Primary osteoarthritis of left knee     Discharge Instructions     Wear the brace when up and active  I have sent in prednisone for you to take 2 tablets in the morning daily for 5 days  Follow up with this office or with primary care if symptoms are persisting.  Follow up in the ER for high fever, trouble swallowing, trouble breathing, other concerning symptoms.     ED Prescriptions    Medication Sig Dispense Auth. Provider   predniSONE (DELTASONE) 20 MG tablet  Take 2 tablets (40 mg total) by mouth daily with breakfast for 5 days. 10 tablet Faustino Congress, NP     PDMP not reviewed this encounter.   Faustino Congress, NP 05/15/21 1001

## 2021-05-11 NOTE — Discharge Instructions (Signed)
Wear the brace when up and active  I have sent in prednisone for you to take 2 tablets in the morning daily for 5 days  Follow up with this office or with primary care if symptoms are persisting.  Follow up in the ER for high fever, trouble swallowing, trouble breathing, other concerning symptoms.

## 2021-05-22 ENCOUNTER — Ambulatory Visit (INDEPENDENT_AMBULATORY_CARE_PROVIDER_SITE_OTHER): Payer: PPO | Admitting: Internal Medicine

## 2021-05-22 ENCOUNTER — Encounter: Payer: Self-pay | Admitting: Internal Medicine

## 2021-05-22 ENCOUNTER — Other Ambulatory Visit: Payer: Self-pay

## 2021-05-22 VITALS — BP 140/84 | HR 87 | Temp 98.1°F | Resp 18 | Ht 62.0 in | Wt 164.0 lb

## 2021-05-22 DIAGNOSIS — Z09 Encounter for follow-up examination after completed treatment for conditions other than malignant neoplasm: Secondary | ICD-10-CM | POA: Diagnosis not present

## 2021-05-22 DIAGNOSIS — M1712 Unilateral primary osteoarthritis, left knee: Secondary | ICD-10-CM

## 2021-05-22 DIAGNOSIS — M1990 Unspecified osteoarthritis, unspecified site: Secondary | ICD-10-CM | POA: Insufficient documentation

## 2021-05-22 DIAGNOSIS — M169 Osteoarthritis of hip, unspecified: Secondary | ICD-10-CM | POA: Insufficient documentation

## 2021-05-22 DIAGNOSIS — M8580 Other specified disorders of bone density and structure, unspecified site: Secondary | ICD-10-CM | POA: Insufficient documentation

## 2021-05-22 MED ORDER — DICLOFENAC SODIUM 1 % EX GEL: 2.0000 g | Freq: Four times a day (QID) | CUTANEOUS | 0 refills | Status: DC

## 2021-05-22 NOTE — Progress Notes (Signed)
Acute Office Visit  Subjective:    Patient ID: Alyssa Durham, female    DOB: [birth year], 70 y.o.   MRN: [id number]  Chief Complaint  Patient presents with  . Knee Pain    On 05-11-21 pt started having left knee pain went to urgent care did xray was given knee braces these didn't work also given prednisone but she did not take these the past two days have been better     HPI Patient is in today for evaluation of left knee pain that started about 10 days ago while she was cleaning at her mother's home. She had acute onset left knee pain, over medial side with swelling. She went to Urgent care and was given Prednisone. She did not take Prednisone as she is concerned about her PRP injections for her hip. She was in an impression that Prednisone would affect PRP effect. Her knee pain has been improving gradually and she has been taking Tylenol. Her knee swelling has also improved.  Past Medical History:  Diagnosis Date  . Allergy   . Anxiety and depression   . Arthritis   . Asthma   . Cataract   . Chronic active type B viral hepatitis ([place])   . Depression   . History of HPV infection 11/15/2017  . HLD (hyperlipidemia) 05/12/2017  . Localized swelling of left lower leg 01/01/2021  . Migraines   . Osteoporosis   . Primary hypertension 01/01/2021    Past Surgical History:  Procedure Laterality Date  . CESAREAN SECTION     x3  . LIVER BIOPSY  1993  . URETHRAL DILATION    . WISDOM TOOTH EXTRACTION      Family History  Problem Relation Age of Onset  . Uterine cancer Mother   . Migraines Father   . Early death Father 60       suicide   . Transient ischemic attack Father        multiple   . Migraines Brother   . Healthy Daughter   . Drug abuse Son        addict - stable on suboxone  . Hepatitis B Son   . Healthy Son   . Cancer Maternal Grandmother        breast  . Alcohol abuse Paternal Grandfather   . Early death Paternal Aunt        suicide  . Mental illness  Paternal Aunt     Social History   Socioeconomic History  . Marital status: Divorced    Spouse name: Not on file  . Number of children: 3  . Years of education: 26  . Highest education level: Not on file  Occupational History  . Occupation: retired    Comment: Chiropractor  Tobacco Use  . Smoking status: Former Smoker    Packs/day: 2.00    Years: 5.00    Pack years: 10.00    Types: Cigarettes    Start date: 12/21/1967    Quit date: 12/20/1972    Years since quitting: 48.4  . Smokeless tobacco: Never Used  Vaping Use  . Vaping Use: Never used  Substance and Sexual Activity  . Alcohol use: No  . Drug use: No  . Sexual activity: Not Currently  Other Topics Concern  . Not on file  Social History Narrative   Retired Web designer   Three children  [place] and [employer] - 2   Right handed   [employer]  Social Determinants of Health   Financial Resource Strain: Not on file  Food Insecurity: Not on file  Transportation Needs: Not on file  Physical Activity: Not on file  Stress: Not on file  Social Connections: Not on file  Intimate Partner Violence: Not on file    Outpatient Medications Prior to Visit  Medication Sig Dispense Refill  . albuterol (PROAIR HFA) 108 (90 Base) MCG/ACT inhaler Inhale 2 puffs into the lungs every 6 (six) hours as needed for wheezing or shortness of breath. 18 g 5  . Atogepant (QULIPTA) 60 MG TABS Take 1 tablet by mouth daily. 30 tablet 11  . BARACLUDE 0.5 MG tablet TAKE 1 TABLET(0.5 MG) BY MOUTH DAILY 30 tablet 11  . cholecalciferol (VITAMIN D3) 25 MCG (1000 UNIT) tablet Take 1,000 Units by mouth daily.    . Clobetasol Prop Emollient Base 0.05 % emollient cream Apply 1 application topically 2 (two) times daily as needed (irritation).    . Coenzyme Q10 (COQ10) 100 MG CAPS Take 100 mg by mouth daily.    . fluticasone (FLONASE) 50 MCG/ACT nasal spray Place 2 sprays into both nostrils daily. (Patient taking  differently: Place 2 sprays into both nostrils daily as needed for allergies.) 16 g 6  . ibandronate (BONIVA) 150 MG tablet Take 1 tablet (150 mg total) by mouth every 30 (thirty) days. Take in the morning with a full glass of water, on an empty stomach, and do not take anything else by mouth or lie down for the next 30 min. 3 tablet 3  . levocetirizine (XYZAL) 5 MG tablet TAKE 1 TABLET(5 MG) BY MOUTH EVERY EVENING 30 tablet 5  . magnesium oxide (MAG-OX) 400 MG tablet Take 400 mg by mouth daily.    . Multiple Vitamins-Minerals (HAIR/SKIN/NAILS/BIOTIN PO) Take 1 each by mouth in the morning, at noon, and at bedtime.    . RELPAX 40 MG tablet TAKE 1 TABLET BY MOUTH AT ONSET OF MIGRAINE. DO NOT TAKE MORE THAN 3 TABLETS A WEEK (Patient taking differently: Take 40 mg by mouth every 2 (two) hours as needed for migraine. TAKE 1 TABLET BY MOUTH AT ONSET OF MIGRAINE. DO NOT TAKE MORE THAN 3 TABLETS A WEEK) 12 tablet 11  . tretinoin (RETIN-A) 0.1 % cream Apply 1 application topically at bedtime as needed. Patient states that she uses sporadic    . zolpidem (AMBIEN) 5 MG tablet Take 1 tablet (5 mg total) by mouth at bedtime as needed for sleep. 30 tablet 1   No facility-administered medications prior to visit.    Allergies  Allergen Reactions  . Codeine Nausea And Vomiting and Other (See Comments)    Review of Systems  Constitutional: Negative for chills and fever.  HENT: Negative for congestion, postnasal drip, rhinorrhea, sinus pressure, sinus pain and sore throat.   Eyes: Negative for pain and discharge.  Respiratory: Negative for cough and shortness of breath.   Cardiovascular: Negative for chest pain and palpitations.  Gastrointestinal: Negative for abdominal pain, constipation, diarrhea, nausea and vomiting.  Endocrine: Negative for polydipsia and polyuria.  Genitourinary: Negative for dysuria and hematuria.  Musculoskeletal: Positive for arthralgias and back pain. Negative for neck pain and neck  stiffness.  Skin: Negative for rash.  Neurological: Negative for dizziness, seizures, syncope and weakness.  Psychiatric/Behavioral: Negative for agitation and behavioral problems.       Objective:    Physical Exam Vitals reviewed.  Constitutional:      General: She is not in acute distress.  Appearance: She is not diaphoretic.  HENT:     Head: Normocephalic and atraumatic.     Nose: Nose normal. No congestion.     Mouth/Throat:     Mouth: Mucous membranes are moist.     Pharynx: No posterior oropharyngeal erythema.  Eyes:     General: No scleral icterus.    Extraocular Movements: Extraocular movements intact.  Cardiovascular:     Rate and Rhythm: Normal rate and regular rhythm.     Pulses: Normal pulses.     Heart sounds: Normal heart sounds. No murmur heard.   Pulmonary:     Breath sounds: Normal breath sounds. No wheezing or rales.  Abdominal:     Palpations: Abdomen is soft.     Tenderness: There is no abdominal tenderness.  Musculoskeletal:     Cervical back: Neck supple. No tenderness.     Left knee: Swelling (Mild) present. No effusion or erythema.     Right lower leg: No edema.     Left lower leg: No edema.  Skin:    General: Skin is warm.     Findings: No rash.  Neurological:     General: No focal deficit present.     Mental Status: She is alert and oriented to person, place, and time.  Psychiatric:        Mood and Affect: Mood normal.        Behavior: Behavior normal.     BP 140/84 (BP Location: Left Arm, Patient Position: Sitting, Cuff Size: Normal)   Pulse 87   Temp 98.1 F (36.7 C) (Oral)   Resp 18   Ht 5\' 2"  (1.575 m)   Wt 164 lb (74.4 kg)   SpO2 98%   BMI 30.00 kg/m  Wt Readings from Last 3 Encounters:  05/22/21 164 lb (74.4 kg)  04/23/21 167 lb (75.8 kg)  03/30/21 169 lb (76.7 kg)    Health Maintenance Due  Topic Date Due  . COLONOSCOPY (Pts 45-1yrs Insurance coverage will need to be confirmed)  Never done  . Zoster Vaccines-  Shingrix (1 of 2) Never done  . Pneumococcal Vaccine 68-22 Years old (1 of 2 - PPSV23) Never done  . TETANUS/TDAP  01/15/2021    There are no preventive care reminders to display for this patient.   Lab Results  Component Value Date   TSH 1.970 12/25/2020   Lab Results  Component Value Date   WBC 4.2 03/30/2021   HGB 15.2 (H) 03/30/2021   HCT 46.9 (H) 03/30/2021   MCV 94.4 03/30/2021   PLT 256 03/30/2021   Lab Results  Component Value Date   NA 139 12/25/2020   K 4.3 12/25/2020   CO2 25 12/25/2020   GLUCOSE 90 12/25/2020   BUN 13 12/25/2020   CREATININE 0.76 12/25/2020   BILITOT 0.6 02/16/2021   ALKPHOS 86 12/25/2020   AST 29 02/16/2021   ALT 23 02/16/2021   PROT 6.7 02/16/2021   ALBUMIN 4.5 12/25/2020   CALCIUM 9.5 12/25/2020   Lab Results  Component Value Date   CHOL 235 (H) 12/25/2020   Lab Results  Component Value Date   HDL 69 12/25/2020   Lab Results  Component Value Date   LDLCALC 140 (H) 12/25/2020   Lab Results  Component Value Date   TRIG 149 12/25/2020   Lab Results  Component Value Date   CHOLHDL 3.4 12/25/2020   Lab Results  Component Value Date   HGBA1C 5.3 12/25/2020  Assessment & Plan:   Osteoarthritis of left knee Urgent care follow up Urgent care chart reviewed X-ray left knee shows tricompartmental OA and tibial spurring Conservative measures for now - Tylenol PRN Heating pad/ice Voltaren gel PRN If persistent, advised to contact her Orthopedic surgeon    Meds ordered this encounter  Medications  . diclofenac Sodium (VOLTAREN) 1 % GEL    Sig: Apply 2 g topically 4 (four) times daily.    Dispense:  50 g    Refill:  0     Raylan Hanton Keith Rake, MD

## 2021-05-22 NOTE — Patient Instructions (Signed)
Please apply heating pad or ice for knee swelling/pain.  Please take Tylenol or Meloxicam for pain.  Apply Voltaren gel to left knee area for localized pain relief.  If pain is persistent, please contact Orthopedic surgeon for evaluation.

## 2021-06-11 DIAGNOSIS — L650 Telogen effluvium: Secondary | ICD-10-CM | POA: Diagnosis not present

## 2021-06-11 DIAGNOSIS — L821 Other seborrheic keratosis: Secondary | ICD-10-CM | POA: Diagnosis not present

## 2021-06-15 ENCOUNTER — Telehealth (INDEPENDENT_AMBULATORY_CARE_PROVIDER_SITE_OTHER): Payer: Self-pay

## 2021-06-15 NOTE — Telephone Encounter (Signed)
Patient called this am stating she is unable to get her brand name Baraclude until July 31,2022 as it is on back order. She is able to get the generic of this at the [facility] in [place] on [address], but she will need [place] to call or send this in as the rx is written for DAW for Brand name only. Please advise. She has two pills left till she runs out.

## 2021-06-16 NOTE — Telephone Encounter (Signed)
Scott at [facility] aware to convert to the generic. He is aware to have the medication ready for the patient.

## 2021-06-16 NOTE — Telephone Encounter (Signed)
Per Dr Laural Golden the generic is ok to use. Patient should not stop or have an interruption in treatment.

## 2021-06-16 NOTE — Telephone Encounter (Signed)
Patient aware of all.

## 2021-07-15 DIAGNOSIS — B1910 Unspecified viral hepatitis B without hepatic coma: Secondary | ICD-10-CM | POA: Diagnosis not present

## 2021-07-15 DIAGNOSIS — K769 Liver disease, unspecified: Secondary | ICD-10-CM | POA: Diagnosis not present

## 2021-07-15 DIAGNOSIS — Z1159 Encounter for screening for other viral diseases: Secondary | ICD-10-CM | POA: Diagnosis not present

## 2021-07-15 DIAGNOSIS — I10 Essential (primary) hypertension: Secondary | ICD-10-CM | POA: Diagnosis not present

## 2021-07-15 DIAGNOSIS — B181 Chronic viral hepatitis B without delta-agent: Secondary | ICD-10-CM | POA: Diagnosis not present

## 2021-07-19 LAB — COMPREHENSIVE METABOLIC PANEL
AG Ratio: 1.6 (calc) (ref 1.0–2.5)
ALT: 17 U/L (ref 6–29)
AST: 23 U/L (ref 10–35)
Albumin: 4.1 g/dL (ref 3.6–5.1)
Alkaline phosphatase (APISO): 63 U/L (ref 37–153)
BUN: 20 mg/dL (ref 7–25)
CO2: 27 mmol/L (ref 20–32)
Calcium: 9.7 mg/dL (ref 8.6–10.4)
Chloride: 104 mmol/L (ref 98–110)
Creat: 0.85 mg/dL (ref 0.60–1.00)
Globulin: 2.6 g/dL (calc) (ref 1.9–3.7)
Glucose, Bld: 86 mg/dL (ref 65–139)
Potassium: 4.2 mmol/L (ref 3.5–5.3)
Sodium: 138 mmol/L (ref 135–146)
Total Bilirubin: 0.8 mg/dL (ref 0.2–1.2)
Total Protein: 6.7 g/dL (ref 6.1–8.1)

## 2021-07-19 LAB — CBC
HCT: 44.5 % (ref 35.0–45.0)
Hemoglobin: 15.4 g/dL (ref 11.7–15.5)
MCH: 31.1 pg (ref 27.0–33.0)
MCHC: 34.6 g/dL (ref 32.0–36.0)
MCV: 89.9 fL (ref 80.0–100.0)
MPV: 10.7 fL (ref 7.5–12.5)
Platelets: 288 10*3/uL (ref 140–400)
RBC: 4.95 10*6/uL (ref 3.80–5.10)
RDW: 13 % (ref 11.0–15.0)
WBC: 5.4 10*3/uL (ref 3.8–10.8)

## 2021-07-19 LAB — HEPATITIS B SURFACE ANTIGEN: Hepatitis B Surface Ag: REACTIVE — AB

## 2021-07-19 LAB — HEPATITIS B DNA, ULTRAQUANTITATIVE, PCR
Hepatitis B DNA (Calc): <1 Log IU/mL
Hepatitis B DNA: <10 IU/mL

## 2021-08-18 ENCOUNTER — Encounter (INDEPENDENT_AMBULATORY_CARE_PROVIDER_SITE_OTHER): Payer: Self-pay | Admitting: Internal Medicine

## 2021-08-18 ENCOUNTER — Ambulatory Visit (INDEPENDENT_AMBULATORY_CARE_PROVIDER_SITE_OTHER): Payer: PPO | Admitting: Internal Medicine

## 2021-08-18 ENCOUNTER — Other Ambulatory Visit: Payer: Self-pay

## 2021-08-18 VITALS — BP 136/88 | Temp 98.1°F | Ht 62.0 in | Wt 158.0 lb

## 2021-08-18 DIAGNOSIS — B181 Chronic viral hepatitis B without delta-agent: Secondary | ICD-10-CM

## 2021-08-18 NOTE — Patient Instructions (Signed)
Next blood work on 10/15/2021

## 2021-08-19 NOTE — Progress Notes (Signed)
Presenting complaint;  Follow for chronic hepatitis B.  Subjective:  Patient is 70 year old Caucasian female who is here for scheduled visit.  She was last seen on 04/14/2021. She she was diagnosed with hepatitis B in 1993.  She was living in [place] at the time and she was seen at [facility].  She had liver biopsy and was treated with interferon but did not respond.  She had liver biopsy on 03/30/2021 which revealed moderately active chronic hepatitis with advanced fibrosis with bridging septa.  She had grade 2-3 and stage III-IV disease.  I therefore recommended antiviral therapy.  She is started on Baraclude/Entecavir on 04/14/2021. She says she is doing very well.  Early on she had palpitations without chest pain or shortness of breath.  She has lost 6 pounds since her last visit.  She is trying to lose weight.  She has cut back on food intake.  She denies weakness nausea vomiting abdominal pain melena or rectal bleeding.  She is using polyethylene glycol on as-needed basis.  She generally takes one fourth of a dose each time. She had her planned blood work last month which has been reviewed with patient.    Current Medications: Outpatient Encounter Medications as of 08/18/2021  Medication Sig   albuterol (PROAIR HFA) 108 (90 Base) MCG/ACT inhaler Inhale 2 puffs into the lungs every 6 (six) hours as needed for wheezing or shortness of breath.   Atogepant (QULIPTA) 60 MG TABS Take 1 tablet by mouth daily.   BARACLUDE 0.5 MG tablet TAKE 1 TABLET(0.5 MG) BY MOUTH DAILY   cholecalciferol (VITAMIN D3) 25 MCG (1000 UNIT) tablet Take 1,000 Units by mouth daily.   Clobetasol Prop Emollient Base 0.05 % emollient cream Apply 1 application topically 2 (two) times daily as needed (irritation).   Coenzyme Q10 (COQ10) 100 MG CAPS Take 100 mg by mouth daily.   fluticasone (FLONASE) 50 MCG/ACT nasal spray Place 2 sprays into both nostrils daily. (Patient taking differently: Place 2 sprays  into both nostrils daily as needed for allergies.)   ibandronate (BONIVA) 150 MG tablet Take 1 tablet (150 mg total) by mouth every 30 (thirty) days. Take in the morning with a full glass of water, on an empty stomach, and do not take anything else by mouth or lie down for the next 30 min.   levocetirizine (XYZAL) 5 MG tablet TAKE 1 TABLET(5 MG) BY MOUTH EVERY EVENING   magnesium oxide (MAG-OX) 400 MG tablet Take 400 mg by mouth daily.   Multiple Vitamins-Minerals (HAIR/SKIN/NAILS/BIOTIN PO) Take 1 each by mouth in the morning, at noon, and at bedtime.   polyethylene glycol (MIRALAX / GLYCOLAX) 17 g packet Take 4.25 g by mouth daily.   RELPAX 40 MG tablet TAKE 1 TABLET BY MOUTH AT ONSET OF MIGRAINE. DO NOT TAKE MORE THAN 3 TABLETS A WEEK (Patient taking differently: Take 40 mg by mouth every 2 (two) hours as needed for migraine. TAKE 1 TABLET BY MOUTH AT ONSET OF MIGRAINE. DO NOT TAKE MORE THAN 3 TABLETS A WEEK)   tretinoin (RETIN-A) 0.1 % cream Apply 1 application topically at bedtime as needed. Patient states that she uses sporadic   zolpidem (AMBIEN) 5 MG tablet Take 1 tablet (5 mg total) by mouth at bedtime as needed for sleep.   [DISCONTINUED] diclofenac Sodium (VOLTAREN) 1 % GEL Apply 2 g topically 4 (four) times daily.   No facility-administered encounter medications on file as of 08/18/2021.     Objective: Blood pressure  136/88, temperature 98.1 F (36.7 C), temperature source Oral, height 5' 2"  (1.575 m), weight 158 lb (71.7 kg). Patient is alert and in no acute distress. Conjunctiva is pink. Sclera is nonicteric Oropharyngeal mucosa is normal. No neck masses or thyromegaly noted. Cardiac exam with regular rhythm normal S1 and S2. No murmur or gallop noted. Lungs are clear to auscultation. Abdomen is symmetrical soft and nontender without hepatosplenomegaly or masses. No LE edema or clubbing noted.  Labs/studies Results:   CBC Latest Ref Rng & Units 07/15/2021 03/30/2021 12/25/2020   WBC 3.8 - 10.8 Thousand/uL 5.4 4.2 4.5  Hemoglobin 11.7 - 15.5 g/dL 15.4 15.2(H) 16.3(H)  Hematocrit 35.0 - 45.0 % 44.5 46.9(H) 47.6(H)  Platelets 140 - 400 Thousand/uL 288 256 281    CMP Latest Ref Rng & Units 07/15/2021 02/16/2021 12/25/2020  Glucose 65 - 139 mg/dL 86 - 90  BUN 7 - 25 mg/dL 20 - 13  Creatinine 0.60 - 1.00 mg/dL 0.85 - 0.76  Sodium 135 - 146 mmol/L 138 - 139  Potassium 3.5 - 5.3 mmol/L 4.2 - 4.3  Chloride 98 - 110 mmol/L 104 - 102  CO2 20 - 32 mmol/L 27 - 25  Calcium 8.6 - 10.4 mg/dL 9.7 - 9.5  Total Protein 6.1 - 8.1 g/dL 6.7 6.7 7.2  Total Bilirubin 0.2 - 1.2 mg/dL 0.8 0.6 0.5  Alkaline Phos 44 - 121 IU/L - - 86  AST 10 - 35 U/L 23 29 29   ALT 6 - 29 U/L 17 23 19     Hepatic Function Latest Ref Rng & Units 07/15/2021 02/16/2021 12/25/2020  Total Protein 6.1 - 8.1 g/dL 6.7 6.7 7.2  Albumin 3.7 - 4.7 g/dL - - 4.5  AST 10 - 35 U/L 23 29 29   ALT 6 - 29 U/L 17 23 19   Alk Phosphatase 44 - 121 IU/L - - 86  Total Bilirubin 0.2 - 1.2 mg/dL 0.8 0.6 0.5  Bilirubin, Direct 0.0 - 0.2 mg/dL - 0.1 -    Other lab data from 07/15/2021  Hepatitis B surface antigen reactive HBV DNA by PCR undetectable  All lab data reviewed with patient again.  Assessment:  #1.  Chronic hepatitis B.  Liver biopsy in April 2022 revealed chronic active hepatitis grade 2-3 and stage III-IV disease.  She was treated with interferon over 20 years ago without success.  Given histologic findings patient was begun on Entecavir which she is tolerating well and she is responding as evidenced by undetectable HBV DNA by PCR.  Hepatitis B surface antigen remains positive.  It remains to be seen if she would seroconvert.  Chance of that is very small.  Unless she develops resistance she will continue antibiotic therapy for at least 5 years if not longer.  Plan:  Continue Baraclude/Entecavir at current dose of 0.5 mg p.o. daily. She will have CBC, comprehensive chemistry panel HBV DNA by PCR and hepatitis B  surface antigen in 2 months. Office visit in 6 months.

## 2021-08-25 ENCOUNTER — Ambulatory Visit (INDEPENDENT_AMBULATORY_CARE_PROVIDER_SITE_OTHER): Payer: PPO | Admitting: Internal Medicine

## 2021-09-01 ENCOUNTER — Telehealth: Payer: Self-pay | Admitting: Internal Medicine

## 2021-09-01 NOTE — Telephone Encounter (Signed)
Spoke with patient  and she will call back to schedule Medicare Annual Wellness Visit (AWV) by phone   Last AWV: 09/19/2017  Please schedule at anytime with [place].  40 minute appointment  Any questions, please contact me at [phone]

## 2021-09-24 ENCOUNTER — Other Ambulatory Visit: Payer: Self-pay

## 2021-09-24 ENCOUNTER — Ambulatory Visit (INDEPENDENT_AMBULATORY_CARE_PROVIDER_SITE_OTHER): Payer: PPO | Admitting: Internal Medicine

## 2021-09-24 ENCOUNTER — Encounter: Payer: Self-pay | Admitting: Internal Medicine

## 2021-09-24 VITALS — BP 137/82 | HR 72 | Temp 98.0°F | Resp 18 | Ht 62.0 in | Wt 161.0 lb

## 2021-09-24 DIAGNOSIS — M549 Dorsalgia, unspecified: Secondary | ICD-10-CM

## 2021-09-24 DIAGNOSIS — M19041 Primary osteoarthritis, right hand: Secondary | ICD-10-CM | POA: Diagnosis not present

## 2021-09-24 DIAGNOSIS — B181 Chronic viral hepatitis B without delta-agent: Secondary | ICD-10-CM

## 2021-09-24 DIAGNOSIS — Z0001 Encounter for general adult medical examination with abnormal findings: Secondary | ICD-10-CM | POA: Diagnosis not present

## 2021-09-24 DIAGNOSIS — E559 Vitamin D deficiency, unspecified: Secondary | ICD-10-CM | POA: Diagnosis not present

## 2021-09-24 DIAGNOSIS — E782 Mixed hyperlipidemia: Secondary | ICD-10-CM | POA: Diagnosis not present

## 2021-09-24 DIAGNOSIS — M19042 Primary osteoarthritis, left hand: Secondary | ICD-10-CM | POA: Diagnosis not present

## 2021-09-24 DIAGNOSIS — N904 Leukoplakia of vulva: Secondary | ICD-10-CM

## 2021-09-24 DIAGNOSIS — M19049 Primary osteoarthritis, unspecified hand: Secondary | ICD-10-CM | POA: Insufficient documentation

## 2021-09-24 HISTORY — DX: Encounter for general adult medical examination with abnormal findings: Z00.01

## 2021-09-24 MED ORDER — CLOBETASOL PROPIONATE 0.05 % EX CREA: 1.0000 "application " | TOPICAL_CREAM | Freq: Two times a day (BID) | CUTANEOUS | 0 refills | Status: DC

## 2021-09-24 MED ORDER — CYCLOBENZAPRINE HCL 5 MG PO TABS: 5.0000 mg | ORAL_TABLET | Freq: Three times a day (TID) | ORAL | 1 refills | Status: DC | PRN

## 2021-09-24 MED ORDER — MUPIROCIN 2 % EX OINT: 1.0000 "application " | TOPICAL_OINTMENT | Freq: Two times a day (BID) | CUTANEOUS | 0 refills | Status: DC

## 2021-09-24 NOTE — Progress Notes (Signed)
Established Patient Office Visit  Subjective:  Patient ID: Alyssa Durham, female    DOB: 1951-06-04  Age: 70 y.o. 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  She has been having right-sided upper back pain for last few weeks.  She states that she takes care of her mother and has to help her move around.  She denies any recent injury or fall.  Her pain is constant, worse with movement and better with rest.  She has not tried any medication for it yet.  She has started taking Baraclude for chronic hep B and tolerates it well.  She has been having flareups of lichen sclerosis, for which she used to use clobetasol cream.  Denies any vaginal discharge, dysuria or hematuria.  She has been having joint pains in her hands, which is chronic.  Denies any recent injury.  Past Medical History:  Diagnosis Date   Allergy    Anxiety and depression    Arthritis    Asthma    Cataract    Chronic active type B viral hepatitis ([place])    Depression    History of HPV infection 11/15/2017   HLD (hyperlipidemia) 05/12/2017   Localized swelling of left lower leg 01/01/2021   Migraines    Osteoporosis    Primary hypertension 01/01/2021    Past Surgical History:  Procedure Laterality Date   CESAREAN SECTION     x3   LIVER BIOPSY  1993   URETHRAL DILATION     WISDOM TOOTH EXTRACTION      Family History  Problem Relation Age of Onset   Uterine cancer Mother    Migraines Father    Early death Father 16       suicide    Transient ischemic attack Father         multiple    Migraines Brother    Healthy Daughter    Drug abuse Son        addict - stable on suboxone   Hepatitis B Son    Healthy Son    Cancer Maternal Grandmother        breast   Alcohol abuse Paternal Grandfather    Early death Paternal Aunt        suicide   Mental illness Paternal Aunt     Social History   Socioeconomic History   Marital status: Divorced    Spouse name: Not on file   Number of children: 3   Years of education: 67   Highest education level: Not on file  Occupational History   Occupation: retired    Comment: Chiropractor  Tobacco Use   Smoking status: Former    Packs/day: 2.00    Years: 5.00    Pack years: 10.00    Types: Cigarettes    Start date: 12/21/1967    Quit date: 12/20/1972    Years since quitting: 48.7   Smokeless tobacco: Never  Vaping Use   Vaping Use: Never used  Substance and Sexual Activity   Alcohol  use: No   Drug use: No   Sexual activity: Not Currently  Other Topics Concern   Not on file  Social History Narrative   Retired Web designer   Three children  [place], Warden/ranger and Naval architect - 2   Right handed   [employer]   Social Determinants of Health   Financial Resource Strain: Not on file  Food Insecurity: Not on file  Transportation Needs: Not on file  Physical Activity: Not on file  Stress: Not on file  Social Connections: Not on file  Intimate Partner Violence: Not on file    Outpatient Medications Prior to Visit  Medication Sig Dispense Refill   albuterol (PROAIR HFA) 108 (90 Base) MCG/ACT inhaler Inhale 2 puffs into the lungs every 6 (six) hours as needed for wheezing or shortness of breath. 18 g 5   Atogepant (QULIPTA) 60 MG TABS Take 1 tablet by mouth daily. 30 tablet 11   BARACLUDE 0.5 MG tablet TAKE 1 TABLET(0.5 MG) BY MOUTH DAILY 30 tablet 11   cholecalciferol (VITAMIN D3) 25 MCG (1000 UNIT) tablet Take 1,000 Units by mouth daily.     Clobetasol Prop Emollient Base 0.05 %  emollient cream Apply 1 application topically 2 (two) times daily as needed (irritation).     Coenzyme Q10 (COQ10) 100 MG CAPS Take 100 mg by mouth daily.     fluticasone (FLONASE) 50 MCG/ACT nasal spray Place 2 sprays into both nostrils daily. (Patient taking differently: Place 2 sprays into both nostrils daily as needed for allergies.) 16 g 6   ibandronate (BONIVA) 150 MG tablet Take 1 tablet (150 mg total) by mouth every 30 (thirty) days. Take in the morning with a full glass of water, on an empty stomach, and do not take anything else by mouth or lie down for the next 30 min. 3 tablet 3   levocetirizine (XYZAL) 5 MG tablet TAKE 1 TABLET(5 MG) BY MOUTH EVERY EVENING 30 tablet 5   magnesium oxide (MAG-OX) 400 MG tablet Take 400 mg by mouth daily.     Multiple Vitamins-Minerals (HAIR/SKIN/NAILS/BIOTIN PO) Take 1 each by mouth in the morning, at noon, and at bedtime.     polyethylene glycol (MIRALAX / GLYCOLAX) 17 g packet Take 4.25 g by mouth daily.     RELPAX 40 MG tablet TAKE 1 TABLET BY MOUTH AT ONSET OF MIGRAINE. DO NOT TAKE MORE THAN 3 TABLETS A WEEK (Patient taking differently: Take 40 mg by mouth every 2 (two) hours as needed for migraine. TAKE 1 TABLET BY MOUTH AT ONSET OF MIGRAINE. DO NOT TAKE MORE THAN 3 TABLETS A WEEK) 12 tablet 11   tretinoin (RETIN-A) 0.1 % cream Apply 1 application topically at bedtime as needed. Patient states that she uses sporadic     zolpidem (AMBIEN) 5 MG tablet Take 1 tablet (5 mg total) by mouth at bedtime as needed for sleep. 30 tablet 1   No facility-administered medications prior to visit.    Allergies  Allergen Reactions   Codeine Nausea And Vomiting and Other (See Comments)    ROS Review of Systems  Constitutional:  Negative for chills and fever.  HENT:  Negative for congestion, postnasal drip, rhinorrhea, sinus pressure, sinus pain and sore throat.   Eyes:  Negative for pain and discharge.  Respiratory:  Negative for cough and shortness of breath.    Cardiovascular:  Negative for chest pain and palpitations.  Gastrointestinal:  Negative for abdominal pain, constipation, diarrhea, nausea and vomiting.  Endocrine: Negative for polydipsia and polyuria.  Genitourinary:  Negative for dysuria and hematuria.  Musculoskeletal:  Positive for arthralgias and back pain. Negative for neck pain and neck stiffness.  Skin:  Negative for rash.  Neurological:  Negative for dizziness, seizures, syncope and weakness.  Psychiatric/Behavioral:  Negative for agitation and behavioral problems.      Objective:    Physical Exam Vitals reviewed.  Constitutional:      General: She is not in acute distress.    Appearance: She is not diaphoretic.  HENT:     Head: Normocephalic and atraumatic.     Nose: Nose normal. No congestion.     Mouth/Throat:     Mouth: Mucous membranes are moist.     Pharynx: No posterior oropharyngeal erythema.  Eyes:     General: No scleral icterus.    Extraocular Movements: Extraocular movements intact.  Cardiovascular:     Rate and Rhythm: Normal rate and regular rhythm.     Pulses: Normal pulses.     Heart sounds: Normal heart sounds. No murmur heard. Pulmonary:     Breath sounds: Normal breath sounds. No wheezing or rales.  Abdominal:     Palpations: Abdomen is soft.     Tenderness: There is no abdominal tenderness.  Musculoskeletal:     Right hand: Swelling (Mild PIP and DIP joints) present.     Left hand: Swelling (Mild PIP and DIP joints) present.     Cervical back: Neck supple. No tenderness.     Left knee: Swelling (Mild) present. No effusion or erythema.     Right lower leg: No edema.     Left lower leg: No edema.  Skin:    General: Skin is warm.     Findings: No rash.  Neurological:     General: No focal deficit present.     Mental Status: She is alert and oriented to person, place, and time.     Cranial Nerves: No cranial nerve deficit.     Sensory: No sensory deficit.     Motor: No weakness.   Psychiatric:        Mood and Affect: Mood normal.        Behavior: Behavior normal.    BP 137/82 (BP Location: Left Arm, Patient Position: Sitting, Cuff Size: Normal)   Pulse 72   Temp 98 F (36.7 C) (Oral)   Resp 18   Ht 5' 2"  (1.575 m)   Wt 161 lb 0.6 oz (73 kg)   SpO2 96%   BMI 29.45 kg/m  Wt Readings from Last 3 Encounters:  09/24/21 161 lb 0.6 oz (73 kg)  08/18/21 158 lb (71.7 kg)  05/22/21 164 lb (74.4 kg)     Health Maintenance Due  Topic Date Due   COLONOSCOPY (Pts 45-82yr Insurance coverage will need to be confirmed)  Never done   Zoster Vaccines- Shingrix (1 of 2) Never done   TETANUS/TDAP  01/15/2021   COVID-19 Vaccine (4 - Booster for Moderna series) 03/13/2021   INFLUENZA VACCINE  07/20/2021    There are no preventive care reminders to display for this patient.  Lab Results  Component Value Date   TSH 1.970 12/25/2020   Lab Results  Component Value Date   WBC 5.4 07/15/2021   HGB 15.4 07/15/2021   HCT 44.5 07/15/2021   MCV 89.9 07/15/2021   PLT 288 07/15/2021   Lab Results  Component Value Date   NA 138 07/15/2021   K 4.2 07/15/2021   CO2  27 07/15/2021   GLUCOSE 86 07/15/2021   BUN 20 07/15/2021   CREATININE 0.85 07/15/2021   BILITOT 0.8 07/15/2021   ALKPHOS 86 12/25/2020   AST 23 07/15/2021   ALT 17 07/15/2021   PROT 6.7 07/15/2021   ALBUMIN 4.5 12/25/2020   CALCIUM 9.7 07/15/2021   Lab Results  Component Value Date   CHOL 235 (H) 12/25/2020   Lab Results  Component Value Date   HDL 69 12/25/2020   Lab Results  Component Value Date   LDLCALC 140 (H) 12/25/2020   Lab Results  Component Value Date   TRIG 149 12/25/2020   Lab Results  Component Value Date   CHOLHDL 3.4 12/25/2020   Lab Results  Component Value Date   HGBA1C 5.3 12/25/2020      Assessment & Plan:   Problem List Items Addressed This Visit       Encounter for general adult medical examination with abnormal findings - Primary   Physical exam as  documented. Counseling done  re healthy lifestyle involving commitment to 150 minutes exercise per week, heart healthy diet, and attaining healthy weight.The importance of adequate sleep also discussed. Changes in health habits are decided on by the patient with goals and time frames  set for achieving them. Immunization and cancer screening needs are specifically addressed at this visit.     Relevant Orders  CMP14+EGFR  CBC with Differential/Platelet  HgB A1c  TSH    Digestive   Chronic hepatitis B (HCC)    On Baraclude Followed by GI      Relevant Medications   mupirocin ointment (BACTROBAN) 2 %     Musculoskeletal and Integument   Osteoarthritis of hand    Advised to perform simple hand exercises, material provided Voltaren gel PRN      Relevant Medications   cyclobenzaprine (FLEXERIL) 5 MG tablet     Genitourinary   Lichen sclerosus of female genitalia    Uses Clobetasol cream PRN      Relevant Medications   mupirocin ointment (BACTROBAN) 2 %   clobetasol cream (TEMOVATE) 0.05 %     Other   Hyperlipidemia    Advised to follow low-cholesterol diet.      Relevant Orders   Lipid Profile   Vitamin D deficiency    Last vitamin D Lab Results  Component Value Date   VD25OH 25.4 (L) 12/25/2020  Takes vitamin D supplements       Relevant Orders   Vitamin D (25 hydroxy)      Upper back pain on right side    Likely a muscle strain, could be due to heavy weightbearing, takes care of her mother Tylenol or ibuprofen as needed Flexeril as needed for muscle spasm      Relevant Medications   cyclobenzaprine (FLEXERIL) 5 MG tablet    Meds ordered this encounter  Medications   mupirocin ointment (BACTROBAN) 2 %    Sig: Apply 1 application topically 2 (two) times daily.    Dispense:  22 g    Refill:  0   clobetasol cream (TEMOVATE) 0.05 %    Sig: Apply 1 application topically 2 (two) times daily.    Dispense:  30 g    Refill:  0   cyclobenzaprine  (FLEXERIL) 5 MG tablet    Sig: Take 1 tablet (5 mg total) by mouth 3 (three) times daily as needed for muscle spasms.    Dispense:  30 tablet    Refill:  1  Follow-up: Return in about 6 months (around 03/25/2022).    Lindell Spar, MD

## 2021-09-24 NOTE — Assessment & Plan Note (Signed)
Last vitamin D Lab Results  Component Value Date   VD25OH 25.4 (L) 12/25/2020   Takes vitamin D supplements

## 2021-09-24 NOTE — Assessment & Plan Note (Signed)

## 2021-09-24 NOTE — Assessment & Plan Note (Signed)
Advised to perform simple hand exercises, material provided Voltaren gel PRN

## 2021-09-24 NOTE — Assessment & Plan Note (Signed)
Advised to follow low cholesterol diet 

## 2021-09-24 NOTE — Assessment & Plan Note (Signed)
On Baraclude Followed by GI 

## 2021-09-24 NOTE — Assessment & Plan Note (Signed)
Uses Clobetasol cream PRN

## 2021-09-24 NOTE — Assessment & Plan Note (Signed)
Likely a muscle strain, could be due to heavy weightbearing, takes care of her mother Tylenol or ibuprofen as needed Flexeril as needed for muscle spasm

## 2021-09-24 NOTE — Patient Instructions (Signed)
Please take Flexeril for upper back pain/back spasms.  Perform hand exercises as discussed, material attached.  Okay to apply Voltaren gel for joint pain.  Apply Clobetasol cream for lichen sclerosus.

## 2021-10-22 ENCOUNTER — Ambulatory Visit (INDEPENDENT_AMBULATORY_CARE_PROVIDER_SITE_OTHER): Payer: PPO

## 2021-10-22 ENCOUNTER — Other Ambulatory Visit: Payer: Self-pay

## 2021-10-22 DIAGNOSIS — Z23 Encounter for immunization: Secondary | ICD-10-CM | POA: Diagnosis not present

## 2021-11-04 ENCOUNTER — Other Ambulatory Visit (INDEPENDENT_AMBULATORY_CARE_PROVIDER_SITE_OTHER): Payer: Self-pay | Admitting: *Deleted

## 2021-11-04 DIAGNOSIS — B181 Chronic viral hepatitis B without delta-agent: Secondary | ICD-10-CM

## 2021-11-18 DIAGNOSIS — B181 Chronic viral hepatitis B without delta-agent: Secondary | ICD-10-CM | POA: Diagnosis not present

## 2021-11-20 LAB — COMPREHENSIVE METABOLIC PANEL WITH GFR
AG Ratio: 1.7 (calc) (ref 1.0–2.5)
ALT: 15 U/L (ref 6–29)
AST: 21 U/L (ref 10–35)
Albumin: 4 g/dL (ref 3.6–5.1)
Alkaline phosphatase (APISO): 63 U/L (ref 37–153)
BUN: 16 mg/dL (ref 7–25)
CO2: 29 mmol/L (ref 20–32)
Calcium: 9 mg/dL (ref 8.6–10.4)
Chloride: 105 mmol/L (ref 98–110)
Creat: 0.87 mg/dL (ref 0.60–1.00)
Globulin: 2.4 g/dL (ref 1.9–3.7)
Glucose, Bld: 76 mg/dL (ref 65–99)
Potassium: 4.4 mmol/L (ref 3.5–5.3)
Sodium: 141 mmol/L (ref 135–146)
Total Bilirubin: 0.6 mg/dL (ref 0.2–1.2)
Total Protein: 6.4 g/dL (ref 6.1–8.1)

## 2021-11-20 LAB — CBC WITH DIFFERENTIAL/PLATELET
Absolute Monocytes: 403 cells/uL (ref 200–950)
Basophils Absolute: 40 cells/uL (ref 0–200)
Basophils Relative: 1.1 %
Eosinophils Absolute: 112 cells/uL (ref 15–500)
Eosinophils Relative: 3.1 %
HCT: 44.5 % (ref 35.0–45.0)
Hemoglobin: 15.2 g/dL (ref 11.7–15.5)
Lymphs Abs: 695 cells/uL — ABNORMAL LOW (ref 850–3900)
MCH: 31 pg (ref 27.0–33.0)
MCHC: 34.2 g/dL (ref 32.0–36.0)
MCV: 90.6 fL (ref 80.0–100.0)
MPV: 10.8 fL (ref 7.5–12.5)
Monocytes Relative: 11.2 %
Neutro Abs: 2351 cells/uL (ref 1500–7800)
Neutrophils Relative %: 65.3 %
Platelets: 261 10*3/uL (ref 140–400)
RBC: 4.91 10*6/uL (ref 3.80–5.10)
RDW: 12.3 % (ref 11.0–15.0)
Total Lymphocyte: 19.3 %
WBC: 3.6 10*3/uL — ABNORMAL LOW (ref 3.8–10.8)

## 2021-11-20 LAB — HEPATITIS B SURFACE ANTIGEN: Hepatitis B Surface Ag: REACTIVE — AB

## 2021-11-20 LAB — HEPATITIS B DNA, ULTRAQUANTITATIVE, PCR
Hepatitis B DNA (Calc): <1 Log IU/mL
Hepatitis B DNA: <10 IU/mL

## 2021-12-04 ENCOUNTER — Encounter: Payer: Self-pay | Admitting: Neurology

## 2021-12-04 ENCOUNTER — Ambulatory Visit: Payer: PPO | Admitting: Neurology

## 2021-12-04 ENCOUNTER — Other Ambulatory Visit: Payer: Self-pay

## 2021-12-04 VITALS — BP 124/90 | HR 78 | Resp 18 | Ht 62.0 in | Wt 161.0 lb

## 2021-12-04 DIAGNOSIS — G43709 Chronic migraine without aura, not intractable, without status migrainosus: Secondary | ICD-10-CM | POA: Diagnosis not present

## 2021-12-04 MED ORDER — RELPAX 40 MG PO TABS: ORAL_TABLET | ORAL | 11 refills | Status: DC

## 2021-12-04 MED ORDER — QULIPTA 60 MG PO TABS: 1.0000 | ORAL_TABLET | Freq: Every day | ORAL | 11 refills | Status: DC

## 2021-12-04 NOTE — Patient Instructions (Signed)
Happy to hear you are doing good. Refills sent for your medications. Follow-up in 6-8 months, call for any changes.

## 2021-12-04 NOTE — Progress Notes (Signed)
NEUROLOGY FOLLOW UP OFFICE NOTE  Alyssa Durham [id number] 11-Sep-1951  HISTORY OF PRESENT ILLNESS: I had the pleasure of seeing Alyssa Durham in follow-up in the neurology clinic on 12/04/2021.  The patient was last seen over a year ago for chronic migraine. Since her last visit, she was started on Qulipta 60mg  daily in march 2022, she takes 1/2 tablet twice a day and reports significant reduction in migraines. When she forgets to take medication 2-3 evenings in a row, she would get a migraine and take Relpax. They are so mild that when she gets them, she does not take as much Relpax, cutting the pill depending on her pain level. She has some constipation with Qulipta but Miralax helps and she is fine. She denies any dizziness, focal numbness/tingling/weakness, vision changes, no falls. She takes prn Ambien 1/2 tablet, no daytime drowsiness.   History on Initial Assessment 08/16/2016: This is a 70 yo RH woman with a history of chronic migraines, depression. She moved from [place] 2 months ago, records from her previous neurologist were reviewed. She has had migraines since 1969. Migraines now are mostly localized over the right hemisphere, if she closes her eyes, she sees flashing lights, but no prior aura. This is associated with nausea, vomiting, photo and phonophobia. She has 4-5 migraine days a week, taking 1/2 to 2 tablets of Relpax at the onset, which does help ease progression of symptoms. For the past 4 years, she has had migraines upon awakening. Migraines were fairly well-controlled until 1993 when she was started on a trial of an unrecalled antidepressant which helped. In December 2016, headaches worsened and she was started on Lexapro, which helped with headaches but caused weight gain and hair loss. She tried Celexa (weight gain), Zoloft (weight gain), Wellbutrin. She also tried Topamax but stopped due to risk for kidney stones. She recalls taking Inderal but cannot recall effects/side effects.  Most recently, she was tried on nortriptyline, which did not help with the headaches, but helped her mood. She is now on Cymbalta for arthritis, which also helps with her mood. For rescue, she has tried sumatriptan, Maxalt, and Zomig, with no effect. Relpax and Frova helped the most, causing less palpitations. She has noticed stress to be a big trigger, as well a chocolates. She usually sleeps good. There is a family history of migraines in her father, brother, and niece. She had received 2 rounds of Botox injections for chronic migraine, the first one helped very well with much less use of Relpax. Last Botox was in April 2017 before her move.    She is also reporting memory loss. She had told her neurologist about this in 1994. Memory issues are more for long-term memory, she cannot recall what happened years ago with her family, which concerns her because she cannot recall her children growing up. Short-term memory is pretty good, she denies getting lost driving, no missed medications or bill payments. She denies any dizziness, diplopia, dysarthria, dysphagia, neck pain, bowel/bladder dysfunction. She has floaters in her left eye.    Diagnostic Data: MRI brain without contrast done 04/17/15 at [facility] in [place] did not show any acute changes, there was subtle white matter hyperintensity in the posterior left occipital lobe. Compared to 2007 study, this is only minimally progressive. EEG done 04/18/15 in [place] was within normal limits. She had a liver MRI with incidental finding of syrinx on the thoracic cord. 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 No underlying mass, mass effect, or cord thinning. She is asymptomatic from this.   Prior Preventative Medications: Zoloft, Topamax, Inderal, nortriptyline, Cymbalta, Botox, Zonisamide, Aimovig Prior Rescue medications: Relpax,  sumatriptan, maxalt, Zomig, Frova, Amerge (did help), Ubrelvy, Nurtec Has not tried: gabapentin, depakote  PAST MEDICAL HISTORY: Past Medical History:  Diagnosis Date   Allergy    Anxiety and depression    Arthritis    Asthma    Cataract    Chronic active type B viral hepatitis ([place])    Depression    History of HPV infection 11/15/2017   HLD (hyperlipidemia) 05/12/2017   Localized swelling of left lower leg 01/01/2021   Migraines    Osteoporosis    Primary hypertension 01/01/2021    MEDICATIONS: Current Outpatient Medications on File Prior to Visit  Medication Sig Dispense Refill   albuterol (PROAIR HFA) 108 (90 Base) MCG/ACT inhaler Inhale 2 puffs into the lungs every 6 (six) hours as needed for wheezing or shortness of breath. 18 g 5   Atogepant (QULIPTA) 60 MG TABS Take 1 tablet by mouth daily. 30 tablet 11   BARACLUDE 0.5 MG tablet TAKE 1 TABLET(0.5 MG) BY MOUTH DAILY 30 tablet 11   cholecalciferol (VITAMIN D3) 25 MCG (1000 UNIT) tablet Take 1,000 Units by mouth daily.     clobetasol cream (TEMOVATE) 4.09 % Apply 1 application topically 2 (two) times daily. 30 g 0   Clobetasol Prop Emollient Base 0.05 % emollient cream Apply 1 application topically 2 (two) times daily as needed (irritation).     Coenzyme Q10 (COQ10) 100 MG CAPS Take 100 mg by mouth daily.     cyclobenzaprine (FLEXERIL) 5 MG tablet Take 1 tablet (5 mg total) by mouth 3 (three) times daily as needed for muscle spasms. 30 tablet 1   fluticasone (FLONASE) 50 MCG/ACT nasal spray Place 2 sprays into both nostrils daily. (Patient taking differently: Place 2 sprays into both nostrils daily as needed for allergies.) 16 g 6   ibandronate (BONIVA) 150 MG tablet Take 1 tablet (150 mg total) by mouth every 30 (thirty) days. Take in the morning with a full glass of water, on an empty stomach, and do not take anything else by mouth or lie down for the next 30 min. 3 tablet 3   levocetirizine (XYZAL) 5 MG tablet TAKE 1 TABLET(5  MG) BY MOUTH EVERY EVENING 30 tablet 5   magnesium oxide (MAG-OX) 400 MG tablet Take 400 mg by mouth daily.     Multiple Vitamins-Minerals (HAIR/SKIN/NAILS/BIOTIN PO) Take 1 each by mouth in the morning, at noon, and at bedtime.     polyethylene glycol (MIRALAX / GLYCOLAX) 17 g packet Take 4.25 g by mouth daily.     RELPAX 40 MG tablet TAKE 1 TABLET BY MOUTH AT ONSET OF MIGRAINE. DO NOT TAKE MORE THAN 3 TABLETS A WEEK (Patient taking differently: Take 40 mg by mouth every 2 (two) hours as needed for migraine. TAKE 1 TABLET BY MOUTH AT ONSET OF MIGRAINE. DO NOT TAKE MORE THAN 3 TABLETS A WEEK) 12 tablet 11   tretinoin (RETIN-A) 0.1 % cream Apply 1 application topically at bedtime as needed. Patient states that she uses sporadic     zolpidem (AMBIEN) 5 MG tablet Take 1 tablet (5 mg total) by mouth at bedtime as needed for sleep. 30 tablet 1   losartan (COZAAR) 25 MG tablet Take 25 mg by mouth daily. (Patient  not taking: Reported on 12/04/2021)     mupirocin ointment (BACTROBAN) 2 % Apply 1 application topically 2 (two) times daily. (Patient not taking: Reported on 12/04/2021) 22 g 0   No current facility-administered medications on file prior to visit.    ALLERGIES: Allergies  Allergen Reactions   Codeine Nausea And Vomiting and Other (See Comments)    FAMILY HISTORY: Family History  Problem Relation Age of Onset   Uterine cancer Mother    Migraines Father    Early death Father 62       suicide    Transient ischemic attack Father        multiple    Migraines Brother    Healthy Daughter    Drug abuse Son        addict - stable on suboxone   Hepatitis B Son    Healthy Son    Cancer Maternal Grandmother        breast   Alcohol abuse Paternal Grandfather    Early death Paternal Aunt        suicide   Mental illness Paternal Aunt     SOCIAL HISTORY: Social History   Socioeconomic History   Marital status: Divorced    Spouse name: Not on file   Number of children: 3   Years of  education: 22   Highest education level: Not on file  Occupational History   Occupation: retired    Comment: Chiropractor  Tobacco Use   Smoking status: Former    Packs/day: 2.00    Years: 5.00    Pack years: 10.00    Types: Cigarettes    Start date: 12/21/1967    Quit date: 12/20/1972    Years since quitting: 48.9   Smokeless tobacco: Never  Vaping Use   Vaping Use: Never used  Substance and Sexual Activity   Alcohol use: No   Drug use: No   Sexual activity: Not Currently  Other Topics Concern   Not on file  Social History Narrative   Retired Web designer   Three children  [place], [place] and Naval architect - 2   Right handed   [employer]   Social Determinants of Health   Financial Resource Strain: Not on file  Food Insecurity: Not on file  Transportation Needs: Not on file  Physical Activity: Not on file  Stress: Not on file  Social Connections: Not on file  Intimate Partner Violence: Not on file     PHYSICAL EXAM: Vitals:   12/04/21 1540  BP: 124/90  Pulse: 78  Resp: 18  SpO2: 95%   General: No acute distress Head:  Normocephalic/atraumatic Skin/Extremities: No rash, no edema Neurological Exam: alert and awake. No aphasia or dysarthria. Fund of knowledge is appropriate.  Attention and concentration are normal.   Cranial nerves: Pupils equal, round. Extraocular movements intact with no nystagmus. Visual fields full.  No facial asymmetry.  Motor: Bulk and tone normal, muscle strength 5/5 throughout with no pronator drift.   Finger to nose testing intact.  Gait narrow-based and steady, no ataxia.   IMPRESSION: This is a 70 yo RH woman with chronic migraines with significant improvement on Qulipta 60mg  daily. She has prn Relpax for migraine rescue. She is happy with current regimen and has not had this amount of freedom from  migraine with numerous preventative medications in the past. Refills sent for medications, follow-up in 6-8 months, she  knows to call for any changes.   Thank you for allowing  me to participate in her care.  Please do not hesitate to call for any questions or concerns.    Ellouise Newer, M.D.   CC: Dr. Posey Pronto

## 2021-12-11 ENCOUNTER — Telehealth: Payer: Self-pay | Admitting: Internal Medicine

## 2021-12-11 ENCOUNTER — Other Ambulatory Visit: Payer: Self-pay | Admitting: Internal Medicine

## 2021-12-11 DIAGNOSIS — F19982 Other psychoactive substance use, unspecified with psychoactive substance-induced sleep disorder: Secondary | ICD-10-CM

## 2021-12-11 MED ORDER — ZOLPIDEM TARTRATE 5 MG PO TABS: 5.0000 mg | ORAL_TABLET | Freq: Every evening | ORAL | 1 refills | Status: DC | PRN

## 2021-12-11 NOTE — Telephone Encounter (Signed)
Pt needs med sent over to [facility]   zolpidem (AMBIEN) 5 MG tablet   Since pharm has not been the filling pharmacy they need authorization and script sent to them   Fax  [phone]

## 2021-12-20 HISTORY — PX: KNEE SURGERY: SHX244

## 2021-12-23 ENCOUNTER — Ambulatory Visit (INDEPENDENT_AMBULATORY_CARE_PROVIDER_SITE_OTHER): Payer: PPO | Admitting: Internal Medicine

## 2021-12-23 ENCOUNTER — Other Ambulatory Visit: Payer: Self-pay

## 2021-12-23 ENCOUNTER — Encounter: Payer: Self-pay | Admitting: Internal Medicine

## 2021-12-23 ENCOUNTER — Encounter: Payer: Self-pay | Admitting: *Deleted

## 2021-12-23 VITALS — BP 124/78 | HR 78 | Resp 18 | Ht 62.0 in | Wt 161.1 lb

## 2021-12-23 DIAGNOSIS — M1712 Unilateral primary osteoarthritis, left knee: Secondary | ICD-10-CM

## 2021-12-23 MED ORDER — NAPROXEN 500 MG PO TABS: 500.0000 mg | ORAL_TABLET | Freq: Two times a day (BID) | ORAL | 1 refills | Status: DC

## 2021-12-23 NOTE — Progress Notes (Signed)
Acute Office Visit  Subjective:    Patient ID: Alyssa Durham, female    DOB: 1951/11/08, 71 y.o.   MRN: [id number]  Chief Complaint  Patient presents with   Knee Pain    Started 12-06-21 pt has left knee pain it is above behind and below the knee hurts worse when walking on it     HPI Patient is in today for evaluation of left knee pain that started about 15 days ago.  He is gradual in onset, about 4/10 currently, but gets up to 8/10 upon walking and standing for long time and is associated with swelling of the knee.  She also reports tenderness on the medial and lateral sides of the knee along with thigh and leg pain at times.  Past Medical History:  Diagnosis Date   Allergy    Anxiety and depression    Arthritis    Asthma    Cataract    Chronic active type B viral hepatitis ([place])    Depression    History of HPV infection 11/15/2017   HLD (hyperlipidemia) 05/12/2017   Localized swelling of left lower leg 01/01/2021   Migraines    Osteoporosis    Primary hypertension 01/01/2021    Past Surgical History:  Procedure Laterality Date   CESAREAN SECTION     x3   LIVER BIOPSY  1993   URETHRAL DILATION     WISDOM TOOTH EXTRACTION      Family History  Problem Relation Age of Onset   Uterine cancer Mother    Migraines Father    Early death Father 84       suicide    Transient ischemic attack Father        multiple    Migraines Brother    Healthy Daughter    Drug abuse Son        addict - stable on suboxone   Hepatitis B Son    Healthy Son    Cancer Maternal Grandmother        breast   Alcohol abuse Paternal Grandfather    Early death Paternal Aunt        suicide   Mental illness Paternal Aunt     Social History   Socioeconomic History   Marital status: Divorced    Spouse name: Not on file   Number of children: 3   Years of education: 46   Highest education level: Not on file  Occupational History   Occupation: retired    Comment: Chiropractor   Tobacco Use   Smoking status: Former    Packs/day: 2.00    Years: 5.00    Pack years: 10.00    Types: Cigarettes    Start date: 12/21/1967    Quit date: 12/20/1972    Years since quitting: 49.0   Smokeless tobacco: Never  Vaping Use   Vaping Use: Never used  Substance and Sexual Activity   Alcohol use: No   Drug use: No   Sexual activity: Not Currently  Other Topics Concern   Not on file  Social History Narrative   Retired Web designer   Three children  [place], [place] and Naval architect - 2   Right handed   [employer]   Social Determinants of Health   Financial Resource Strain: Not on file  Food Insecurity: Not on file  Transportation Needs: Not on file  Physical Activity: Not on file  Stress: Not on file  Social Connections: Not on file  Intimate Partner Violence: Not on file    Outpatient Medications Prior to Visit  Medication Sig Dispense Refill   albuterol (PROAIR HFA) 108 (90 Base) MCG/ACT inhaler Inhale 2 puffs into the lungs every 6 (six) hours as needed for wheezing or shortness of breath. 18 g 5   Atogepant (QULIPTA) 60 MG TABS Take 1 tablet by mouth daily. 30 tablet 11   BARACLUDE 0.5 MG tablet TAKE 1 TABLET(0.5 MG) BY MOUTH DAILY 30 tablet 11   cholecalciferol (VITAMIN D3) 25 MCG (1000 UNIT) tablet Take 1,000 Units by mouth daily.     clobetasol cream (TEMOVATE) 2.70 % Apply 1 application topically 2 (two) times daily. 30 g 0   Clobetasol Prop Emollient Base 0.05 % emollient cream Apply 1 application topically 2 (two) times daily as needed (irritation).     cyclobenzaprine (FLEXERIL) 5 MG tablet Take 1 tablet (5 mg total) by mouth 3 (three) times daily as needed for muscle spasms. 30 tablet 1   fluticasone (FLONASE) 50 MCG/ACT nasal spray Place 2 sprays into both nostrils daily. (Patient taking differently: Place 2 sprays into both nostrils daily as needed for allergies.) 16 g 6   ibandronate (BONIVA) 150 MG tablet Take 1 tablet (150 mg  total) by mouth every 30 (thirty) days. Take in the morning with a full glass of water, on an empty stomach, and do not take anything else by mouth or lie down for the next 30 min. 3 tablet 3   levocetirizine (XYZAL) 5 MG tablet TAKE 1 TABLET(5 MG) BY MOUTH EVERY EVENING 30 tablet 5   magnesium oxide (MAG-OX) 400 MG tablet Take 400 mg by mouth daily.     Multiple Vitamins-Minerals (HAIR/SKIN/NAILS/BIOTIN PO) Take 1 each by mouth in the morning, at noon, and at bedtime.     mupirocin ointment (BACTROBAN) 2 % Apply 1 application topically 2 (two) times daily. 22 g 0   polyethylene glycol (MIRALAX / GLYCOLAX) 17 g packet Take 4.25 g by mouth daily.     RELPAX 40 MG tablet May repeat in 2 hours if headache persists or recurs. 12 tablet 11   tretinoin (RETIN-A) 0.1 % cream Apply 1 application topically at bedtime as needed. Patient states that she uses sporadic     zolpidem (AMBIEN) 5 MG tablet Take 1 tablet (5 mg total) by mouth at bedtime as needed for sleep. 30 tablet 1   Coenzyme Q10 (COQ10) 100 MG CAPS Take 100 mg by mouth daily.     losartan (COZAAR) 25 MG tablet Take 25 mg by mouth daily. (Patient not taking: Reported on 12/04/2021)     No facility-administered medications prior to visit.    Allergies  Allergen Reactions   Codeine Nausea And Vomiting and Other (See Comments)    Review of Systems  Constitutional:  Negative for chills and fever.  HENT:  Negative for congestion, postnasal drip, rhinorrhea, sinus pressure, sinus pain and sore throat.   Eyes:  Negative for pain and discharge.  Respiratory:  Negative for cough and shortness of breath.   Cardiovascular:  Negative for chest pain and palpitations.  Gastrointestinal:  Negative for abdominal pain, constipation, diarrhea, nausea and vomiting.  Endocrine: Negative for polydipsia and polyuria.  Genitourinary:  Negative for dysuria and hematuria.  Musculoskeletal:  Positive for arthralgias and back pain. Negative for neck pain and  neck stiffness.  Skin:  Negative for rash.  Neurological:  Negative for dizziness, seizures, syncope and weakness.  Psychiatric/Behavioral:  Negative for agitation and behavioral problems.  Objective:    Physical Exam Vitals reviewed.  Constitutional:      General: She is not in acute distress.    Appearance: She is not diaphoretic.  HENT:     Head: Normocephalic and atraumatic.     Nose: Nose normal. No congestion.     Mouth/Throat:     Mouth: Mucous membranes are moist.     Pharynx: No posterior oropharyngeal erythema.  Eyes:     General: No scleral icterus.    Extraocular Movements: Extraocular movements intact.  Cardiovascular:     Rate and Rhythm: Normal rate and regular rhythm.     Pulses: Normal pulses.     Heart sounds: Normal heart sounds. No murmur heard. Pulmonary:     Breath sounds: Normal breath sounds. No wheezing or rales.  Musculoskeletal:     Right hand: Swelling (Mild PIP and DIP joints) present.     Left hand: Swelling (Mild PIP and DIP joints) present.     Cervical back: Neck supple. No tenderness.     Left knee: Swelling (Mild) present. No effusion or erythema.     Right lower leg: No edema.     Left lower leg: No edema.  Skin:    General: Skin is warm.     Findings: No rash.  Neurological:     General: No focal deficit present.     Mental Status: She is alert and oriented to person, place, and time.     Cranial Nerves: No cranial nerve deficit.     Sensory: No sensory deficit.     Motor: No weakness.  Psychiatric:        Mood and Affect: Mood normal.        Behavior: Behavior normal.    BP 124/78 (BP Location: Left Arm, Patient Position: Sitting, Cuff Size: Normal)    Pulse 78    Resp 18    Ht 5\' 2"  (1.575 m)    Wt 161 lb 1.9 oz (73.1 kg)    SpO2 94%    BMI 29.47 kg/m  Wt Readings from Last 3 Encounters:  12/23/21 161 lb 1.9 oz (73.1 kg)  12/04/21 161 lb (73 kg)  09/24/21 161 lb 0.6 oz (73 kg)        Assessment & Plan:   Problem  List Items Addressed This Visit       Musculoskeletal and Integument   Osteoarthritis - Primary    Previous x-rays reviewed- tricompartmental osteoarthritis noted She has history of hip arthritis and arthritis of small joints of hand as well Naproxen as needed for now Referred to orthopedic surgeon Advised to avoid standing for long times Advised to perform leg elevation as tolerated while sitting for leg swelling      Relevant Medications   naproxen (NAPROSYN) 500 MG tablet   Other Relevant Orders   Ambulatory referral to Orthopedic Surgery     Meds ordered this encounter  Medications   naproxen (NAPROSYN) 500 MG tablet    Sig: Take 1 tablet (500 mg total) by mouth 2 (two) times daily with a meal.    Dispense:  60 tablet    Refill:  1     Hasini Peachey Keith Rake, MD

## 2021-12-23 NOTE — Patient Instructions (Signed)
Please take Naproxen for knee pain on as needed basis. Okay to take Tylenol in between for knee pain.

## 2021-12-23 NOTE — Assessment & Plan Note (Signed)
Previous x-rays reviewed- tricompartmental osteoarthritis noted She has history of hip arthritis and arthritis of small joints of hand as well Naproxen as needed for now Referred to orthopedic surgeon Advised to avoid standing for long times Advised to perform leg elevation as tolerated while sitting for leg swelling

## 2021-12-29 ENCOUNTER — Telehealth: Payer: Self-pay

## 2021-12-29 NOTE — Telephone Encounter (Signed)
New message     Naelle Shaver Key: [id number] help? Call us at [phone] Outcome Additional Information Required Patient not eligible (does not have coverage with the PBM/payer) Drug Qulipta 60MG  tablets Form [employer] 4-Part Electronic [place] Form [phone] NCPDP)

## 2021-12-30 DIAGNOSIS — M25562 Pain in left knee: Secondary | ICD-10-CM | POA: Diagnosis not present

## 2022-01-10 DIAGNOSIS — M25562 Pain in left knee: Secondary | ICD-10-CM | POA: Diagnosis not present

## 2022-01-14 ENCOUNTER — Ambulatory Visit (INDEPENDENT_AMBULATORY_CARE_PROVIDER_SITE_OTHER): Payer: PPO

## 2022-01-14 ENCOUNTER — Other Ambulatory Visit: Payer: Self-pay

## 2022-01-14 DIAGNOSIS — Z0000 Encounter for general adult medical examination without abnormal findings: Secondary | ICD-10-CM | POA: Diagnosis not present

## 2022-01-14 NOTE — Progress Notes (Signed)
Subjective:   Alyssa Durham is a 71 y.o. female who presents for Medicare Annual (Subsequent) preventive examination.  Review of Systems    I connected with  [name] on 01/14/22 by a audio enabled telemedicine application and verified that I am speaking with the correct person using two identifiers.  Patient Location: Home  Provider Location: Office/Clinic  I discussed the limitations of evaluation and management by telemedicine. The patient expressed understanding and agreed to proceed.  Cardiac Risk Factors include: hypertension;advanced age (>56men, >39 women)     Objective:    Today's Vitals   01/14/22 1319  PainSc: 3   PainLoc: Knee   There is no height or weight on file to calculate BMI.  Advanced Directives 01/14/2022 03/30/2021 10/13/2020 06/03/2020 04/30/2019 10/21/2017 09/19/2017  Does Patient Have a Medical Advance Directive? No No No No No No No  Would patient like information on creating a medical advance directive? No - Patient declined No - Patient declined Yes (MAU/Ambulatory/Procedural Areas - Information given) - - No - Patient declined Yes (MAU/Ambulatory/Procedural Areas - Information given)    Current Medications (verified) Outpatient Encounter Medications as of 01/14/2022  Medication Sig   albuterol (PROAIR HFA) 108 (90 Base) MCG/ACT inhaler Inhale 2 puffs into the lungs every 6 (six) hours as needed for wheezing or shortness of breath.   Atogepant (QULIPTA) 60 MG TABS Take 1 tablet by mouth daily.   BARACLUDE 0.5 MG tablet TAKE 1 TABLET(0.5 MG) BY MOUTH DAILY   cholecalciferol (VITAMIN D3) 25 MCG (1000 UNIT) tablet Take 1,000 Units by mouth daily.   clobetasol cream (TEMOVATE) 6.64 % Apply 1 application topically 2 (two) times daily.   Clobetasol Prop Emollient Base 0.05 % emollient cream Apply 1 application topically 2 (two) times daily as needed (irritation).   cyclobenzaprine (FLEXERIL) 5 MG tablet Take 1 tablet (5 mg total) by mouth 3 (three)  times daily as needed for muscle spasms.   fluticasone (FLONASE) 50 MCG/ACT nasal spray Place 2 sprays into both nostrils daily. (Patient taking differently: Place 2 sprays into both nostrils daily as needed for allergies.)   ibandronate (BONIVA) 150 MG tablet Take 1 tablet (150 mg total) by mouth every 30 (thirty) days. Take in the morning with a full glass of water, on an empty stomach, and do not take anything else by mouth or lie down for the next 30 min.   levocetirizine (XYZAL) 5 MG tablet TAKE 1 TABLET(5 MG) BY MOUTH EVERY EVENING   magnesium oxide (MAG-OX) 400 MG tablet Take 400 mg by mouth daily.   Multiple Vitamins-Minerals (HAIR/SKIN/NAILS/BIOTIN PO) Take 1 each by mouth in the morning, at noon, and at bedtime.   mupirocin ointment (BACTROBAN) 2 % Apply 1 application topically 2 (two) times daily.   naproxen (NAPROSYN) 500 MG tablet Take 1 tablet (500 mg total) by mouth 2 (two) times daily with a meal.   polyethylene glycol (MIRALAX / GLYCOLAX) 17 g packet Take 4.25 g by mouth daily.   polyethylene glycol powder (GLYCOLAX/MIRALAX) 17 GM/SCOOP powder polyethylene glycol 3350 17 gram oral powder packet   4.25 g by oral route.   predniSONE (DELTASONE) 20 MG tablet prednisone 20 mg tablet   RELPAX 40 MG tablet May repeat in 2 hours if headache persists or recurs.   tretinoin (RETIN-A) 0.1 % cream Apply 1 application topically at bedtime as needed. Patient states that she uses sporadic   zolpidem (AMBIEN) 5 MG tablet Take 1 tablet (5 mg total) by mouth  at bedtime as needed for sleep.   No facility-administered encounter medications on file as of 01/14/2022.    Allergies (verified) Codeine   History: Past Medical History:  Diagnosis Date   Allergy    Anxiety and depression    Arthritis    Asthma    Cataract    Chronic active type B viral hepatitis ([facility])    Depression    History of HPV infection 11/15/2017   HLD (hyperlipidemia) 05/12/2017   Localized swelling of left lower leg  01/01/2021   Migraines    Osteoporosis    Primary hypertension 01/01/2021   Past Surgical History:  Procedure Laterality Date   CESAREAN SECTION     x3   LIVER BIOPSY  1993   URETHRAL DILATION     WISDOM TOOTH EXTRACTION     Family History  Problem Relation Age of Onset   Uterine cancer Mother    Migraines Father    Early death Father 86       suicide    Transient ischemic attack Father        multiple    Migraines Brother    Healthy Daughter    Drug abuse Son        addict - stable on suboxone   Hepatitis B Son    Healthy Son    Cancer Maternal Grandmother        breast   Alcohol abuse Paternal Grandfather    Early death Paternal Aunt        suicide   Mental illness Paternal Aunt    Social History   Socioeconomic History   Marital status: Divorced    Spouse name: Not on file   Number of children: 3   Years of education: 70   Highest education level: Not on file  Occupational History   Occupation: retired    Comment: Chiropractor  Tobacco Use   Smoking status: Former    Packs/day: 2.00    Years: 5.00    Pack years: 10.00    Types: Cigarettes    Start date: 12/21/1967    Quit date: 12/20/1972    Years since quitting: 49.1   Smokeless tobacco: Never  Vaping Use   Vaping Use: Never used  Substance and Sexual Activity   Alcohol use: No   Drug use: No   Sexual activity: Not Currently  Other Topics Concern   Not on file  Social History Narrative   Retired Web designer   Three children  [place], [place] and Naval architect - 2   Right handed   College   Social Determinants of Health   Financial Resource Strain: Low Risk    Difficulty of Paying Living Expenses: Not hard at all  Food Insecurity: No Food Insecurity   Worried About Charity fundraiser in the Last Year: Never true   Arboriculturist in the Last Year: Never true  Transportation Needs: No Transportation Needs   Lack of Transportation (Medical): No   Lack of  Transportation (Non-Medical): No  Physical Activity: Inactive   Days of Exercise per Week: 0 days   Minutes of Exercise per Session: 0 min  Stress: Stress Concern Present   Feeling of Stress : To some extent  Social Connections: Moderately Integrated   Frequency of Communication with Friends and Family: More than three times a week   Frequency of Social Gatherings with Friends and Family: More than three times a week   Attends Religious Services:  More than 4 times per year   Active Member of [place] or Organizations: Yes   Attends Archivist Meetings: More than 4 times per year   Marital Status: Divorced    Tobacco Counseling Counseling given: Not Answered   Clinical Intake:  Pre-visit preparation completed: Yes  Pain : 0-10 Pain Score: 3  Pain Type: Acute pain Pain Location: Knee Pain Orientation: Left Pain Descriptors / Indicators: Aching Pain Onset: More than a month ago Pain Frequency: Occasional     BMI - recorded: 29.46 Nutritional Status: BMI 25 -29 Overweight Nutritional Risks: None Diabetes: No  How often do you need to have someone help you when you read instructions, pamphlets, or other written materials from your doctor or pharmacy?: 1 - Never What is the last grade level you completed in school?: 12+  Diabetic?no  Interpreter Needed?: No      Activities of Daily Living In your present state of health, do you have any difficulty performing the following activities: 01/14/2022 01/13/2022  Hearing? N N  Vision? N N  Difficulty concentrating or making decisions? N N  Walking or climbing stairs? Y Y  Dressing or bathing? N N  Doing errands, shopping? N N  Preparing Food and eating ? N N  Using the Toilet? N N  In the past six months, have you accidently leaked urine? N N  Do you have problems with loss of bowel control? N N  Managing your Medications? N N  Managing your Finances? N N  Housekeeping or managing your Housekeeping? N N  Some  recent data might be hidden    Patient Care Team: Lindell Spar, MD as PCP - General (Internal Medicine) Cameron Sprang, MD as Consulting Physician (Neurology)  Indicate any recent Medical Services you may have received from other than [facility] providers in the past year (date may be approximate).     Assessment:   This is a routine wellness examination for Kanika.  Hearing/Vision screen No results found.  Dietary issues and exercise activities discussed: Current Exercise Habits: The patient does not participate in regular exercise at present, Exercise limited by: Other - see comments (knee injury)   Goals Addressed             This Visit's Progress    Patient Stated       Be able to walk and exercise.      Depression Screen PHQ 2/9 Scores 01/14/2022 01/14/2022 12/23/2021 09/24/2021 05/22/2021 04/23/2021 01/01/2021  PHQ - 2 Score 0 0 0 0 0 0 0    Fall Risk Fall Risk  01/14/2022 01/13/2022 12/23/2021 09/24/2021 05/22/2021  Falls in the past year? 0 0 0 0 0  Number falls in past yr: 0 0 0 0 0  Injury with Fall? 0 0 0 0 0  Risk for fall due to : No Fall Risks - No Fall Risks No Fall Risks No Fall Risks  Follow up Falls evaluation completed - Falls evaluation completed Falls evaluation completed Falls evaluation completed  Comment - - - - -    FALL RISK PREVENTION PERTAINING TO THE HOME:  Any stairs in or around the home? Yes  If so, are there any without handrails? Yes  Home free of loose throw rugs in walkways, pet beds, electrical cords, etc? Yes  Adequate lighting in your home to reduce risk of falls? Yes   ASSISTIVE DEVICES UTILIZED TO PREVENT FALLS:  Life alert? No  Use of a cane, walker or w/c?  No  Grab bars in the bathroom? yes Shower chair or bench in shower? No  Elevated toilet seat or a handicapped toilet? No     Cognitive Function: MMSE - Mini Mental State Exam 01/14/2022 08/16/2016  Not completed: Unable to complete -  Orientation to time - 5  Orientation to Place  - 5  Registration - 3  Attention/ Calculation - 5  Recall - 3  Language- name 2 objects - 2  Language- repeat - 1  Language- follow 3 step command - 3  Language- read & follow direction - 1  Write a sentence - 1  Copy design - 1  Total score - 30     6CIT Screen 01/14/2022  What Year? 0 points  What month? 0 points  What time? 0 points  Count back from 20 0 points  Months in reverse 0 points  Repeat phrase 0 points  Total Score 0    Immunizations Immunization History  Administered Date(s) Administered   Fluad Quad(high Dose 65+) 09/14/2019, 10/22/2021   Influenza,inj,Quad PF,6+ Mos 09/19/2017   Influenza,inj,quad, With Preservative 09/19/2017, 09/20/2019   Influenza-Unspecified 09/19/2018   Moderna Sars-Covid-2 Vaccination 02/14/2020, 03/13/2020, 11/13/2020   Pneumococcal Conjugate-13 03/26/2015   Pneumococcal Polysaccharide-23 07/21/2011, 09/19/2017   Td 01/15/2011   Zoster, Live 03/23/2010    TDAP status: Due, Education has been provided regarding the importance of this vaccine. Advised may receive this vaccine at local pharmacy or Health Dept. Aware to provide a copy of the vaccination record if obtained from local pharmacy or Health Dept. Verbalized acceptance and understanding.  Flu Vaccine status: Up to date  Pneumococcal vaccine status: Up to date  Covid-19 vaccine status: Completed vaccines  Qualifies for Shingles Vaccine? Yes   Zostavax completed No   Shingrix Completed?: No.    Education has been provided regarding the importance of this vaccine. Patient has been advised to call insurance company to determine out of pocket expense if they have not yet received this vaccine. Advised may also receive vaccine at local pharmacy or Health Dept. Verbalized acceptance and understanding.  Screening Tests Health Maintenance  Topic Date Due   Zoster Vaccines- Shingrix (1 of 2) Never done   COVID-19 Vaccine (4 - Booster for Moderna series) 01/08/2021   TETANUS/TDAP   01/15/2021   MAMMOGRAM  05/08/2023   Fecal DNA (Cologuard)  08/13/2023   Pneumonia Vaccine 88+ Years old  Completed   INFLUENZA VACCINE  Completed   DEXA SCAN  Completed   Hepatitis C Screening  Completed   HPV VACCINES  Aged Out    Health Maintenance  Health Maintenance Due  Topic Date Due   Zoster Vaccines- Shingrix (1 of 2) Never done   COVID-19 Vaccine (4 - Booster for Moderna series) 01/08/2021   TETANUS/TDAP  01/15/2021    Colorectal cancer screening: Type of screening: Cologuard. Completed 08/12/20. Repeat every 3 years  Mammogram status: Completed 05/07/21. Repeat every year  Dexa: Done 07/03/2020  Lung Cancer Screening: (Low Dose CT Chest recommended if Age 31-80 years, 30 pack-year currently smoking OR have quit w/in 15years.) does not qualify.   Lung Cancer Screening Referral: no  Additional Screening:  Hepatitis C Screening: does qualify; Completed 03/29/17  Vision Screening: Recommended annual ophthalmology exams for early detection of glaucoma and other disorders of the eye. Is the patient up to date with their annual eye exam?  Yes  Who is the provider or what is the name of the office in which the patient attends annual eye  exams? Dr.Cotter If pt is not established with a provider, would they like to be referred to a provider to establish care? No .   Dental Screening: Recommended annual dental exams for proper oral hygiene  Community Resource Referral / Chronic Care Management: CRR required this visit?  No   CCM required this visit?  No      Plan:     I have personally reviewed and noted the following in the patients chart:   Medical and social history Use of alcohol, tobacco or illicit drugs  Current medications and supplements including opioid prescriptions.  Functional ability and status Nutritional status Physical activity Advanced directives List of other physicians Hospitalizations, surgeries, and ER visits in previous 12  months Vitals Screenings to include cognitive, depression, and falls Referrals and appointments  In addition, I have reviewed and discussed with patient certain preventive protocols, quality metrics, and best practice recommendations. A written personalized care plan for preventive services as well as general preventive health recommendations were provided to patient.  Ms. Pippen , Thank you for taking time to come for your Medicare Wellness Visit. I appreciate your ongoing commitment to your health goals. Please review the following plan we discussed and let me know if I can assist you in the future.   These are the goals we discussed:  Goals      Exercise 3x per week (30 min per time)     Recommend starting a routine exercise program at least 3 days a week for 30-45 minutes at a time as tolerated.       Patient Stated     Be able to walk and exercise.        This is a list of the screening recommended for you and due dates:  Health Maintenance  Topic Date Due   Zoster (Shingles) Vaccine (1 of 2) Never done   COVID-19 Vaccine (4 - Booster for Moderna series) 01/08/2021   Tetanus Vaccine  01/15/2021   Mammogram  05/08/2023   Cologuard (Stool DNA test)  08/13/2023   Pneumonia Vaccine  Completed   Flu Shot  Completed   DEXA scan (bone density measurement)  Completed   Hepatitis C Screening: USPSTF Recommendation to screen - Ages 42-79 yo.  Completed   HPV Vaccine  Aged [address] [place]   01/14/2022   Nurse Notes:

## 2022-01-14 NOTE — Patient Instructions (Signed)
[name] , Thank you for taking time to come for your Medicare Wellness Visit. I appreciate your ongoing commitment to your health goals. Please review the following plan we discussed and let me know if I can assist you in the future.   Screening recommendations/referrals: Colonoscopy: complete Mammogram: complete Bone Density: complete Recommended yearly ophthalmology/optometry visit for glaucoma screening and checkup Recommended yearly dental visit for hygiene and checkup  Vaccinations: Influenza vaccine: complete Pneumococcal vaccine: complete Tdap vaccine: due now Shingles vaccine: due now    Advanced directives: mailed patient information  Conditions/risks identified: hypertension  Next appointment: 1 year   Preventive Care 73 Years and Older, Female Preventive care refers to lifestyle choices and visits with your health care provider that can promote health and wellness. What does preventive care include? A yearly physical exam. This is also called an annual well check. Dental exams once or twice a year. Routine eye exams. Ask your health care provider how often you should have your eyes checked. Personal lifestyle choices, including: Daily care of your teeth and gums. Regular physical activity. Eating a healthy diet. Avoiding tobacco and drug use. Limiting alcohol use. Practicing safe sex. Taking low-dose aspirin every day. Taking vitamin and mineral supplements as recommended by your health care provider. What happens during an annual well check? The services and screenings done by your health care provider during your annual well check will depend on your age, overall health, lifestyle risk factors, and family history of disease. Counseling  Your health care provider may ask you questions about your: Alcohol use. Tobacco use. Drug use. Emotional well-being. Home and relationship well-being. Sexual activity. Eating habits. History of falls. Memory and ability  to understand (cognition). Work and work Statistician. Reproductive health. Screening  You may have the following tests or measurements: Height, weight, and BMI. Blood pressure. Lipid and cholesterol levels. These may be checked every 5 years, or more frequently if you are over 67 years old. Skin check. Lung cancer screening. You may have this screening every year starting at age 63 if you have a 30-pack-year history of smoking and currently smoke or have quit within the past 15 years. Fecal occult blood test (FOBT) of the stool. You may have this test every year starting at age 7. Flexible sigmoidoscopy or colonoscopy. You may have a sigmoidoscopy every 5 years or a colonoscopy every 10 years starting at age 62. Hepatitis C blood test. Hepatitis B blood test. Sexually transmitted disease (STD) testing. Diabetes screening. This is done by checking your blood sugar (glucose) after you have not eaten for a while (fasting). You may have this done every 1-3 years. Bone density scan. This is done to screen for osteoporosis. You may have this done starting at age 9. Mammogram. This may be done every 1-2 years. Talk to your health care provider about how often you should have regular mammograms. Talk with your health care provider about your test results, treatment options, and if necessary, the need for more tests. Vaccines  Your health care provider may recommend certain vaccines, such as: Influenza vaccine. This is recommended every year. Tetanus, diphtheria, and acellular pertussis (Tdap, Td) vaccine. You may need a Td booster every 10 years. Zoster vaccine. You may need this after age 31. Pneumococcal 13-valent conjugate (PCV13) vaccine. One dose is recommended after age 73. Pneumococcal polysaccharide (PPSV23) vaccine. One dose is recommended after age 7. Talk to your health care provider about which screenings and vaccines you need and how often you need  them. This information is not  intended to replace advice given to you by your health care provider. Make sure you discuss any questions you have with your health care provider. Document Released: 01/02/2016 Document Revised: 08/25/2016 Document Reviewed: 10/07/2015 Elsevier Interactive Patient Education  2017 [place] Prevention in the Home Falls can cause injuries. They can happen to people of all ages. There are many things you can do to make your home safe and to help prevent falls. What can I do on the outside of my home? Regularly fix the edges of walkways and driveways and fix any cracks. Remove anything that might make you trip as you walk through a door, such as a raised step or threshold. Trim any bushes or trees on the path to your home. Use bright outdoor lighting. Clear any walking paths of anything that might make someone trip, such as rocks or tools. Regularly check to see if handrails are loose or broken. Make sure that both sides of any steps have handrails. Any raised decks and porches should have guardrails on the edges. Have any leaves, snow, or ice cleared regularly. Use sand or salt on walking paths during winter. Clean up any spills in your garage right away. This includes oil or grease spills. What can I do in the bathroom? Use night lights. Install grab bars by the toilet and in the tub and shower. Do not use towel bars as grab bars. Use non-skid mats or decals in the tub or shower. If you need to sit down in the shower, use a plastic, non-slip stool. Keep the floor dry. Clean up any water that spills on the floor as soon as it happens. Remove soap buildup in the tub or shower regularly. Attach bath mats securely with double-sided non-slip rug tape. Do not have throw rugs and other things on the floor that can make you trip. What can I do in the bedroom? Use night lights. Make sure that you have a light by your bed that is easy to reach. Do not use any sheets or blankets that are  too big for your bed. They should not hang down onto the floor. Have a firm chair that has side arms. You can use this for support while you get dressed. Do not have throw rugs and other things on the floor that can make you trip. What can I do in the kitchen? Clean up any spills right away. Avoid walking on wet floors. Keep items that you use a lot in easy-to-reach places. If you need to reach something above you, use a strong step stool that has a grab bar. Keep electrical cords out of the way. Do not use floor polish or wax that makes floors slippery. If you must use wax, use non-skid floor wax. Do not have throw rugs and other things on the floor that can make you trip. What can I do with my stairs? Do not leave any items on the stairs. Make sure that there are handrails on both sides of the stairs and use them. Fix handrails that are broken or loose. Make sure that handrails are as long as the stairways. Check any carpeting to make sure that it is firmly attached to the stairs. Fix any carpet that is loose or worn. Avoid having throw rugs at the top or bottom of the stairs. If you do have throw rugs, attach them to the floor with carpet tape. Make sure that you have a light switch at  the top of the stairs and the bottom of the stairs. If you do not have them, ask someone to add them for you. What else can I do to help prevent falls? Wear shoes that: Do not have high heels. Have rubber bottoms. Are comfortable and fit you well. Are closed at the toe. Do not wear sandals. If you use a stepladder: Make sure that it is fully opened. Do not climb a closed stepladder. Make sure that both sides of the stepladder are locked into place. Ask someone to hold it for you, if possible. Clearly mark and make sure that you can see: Any grab bars or handrails. First and last steps. Where the edge of each step is. Use tools that help you move around (mobility aids) if they are needed. These  include: Canes. Walkers. Scooters. Crutches. Turn on the lights when you go into a dark area. Replace any light bulbs as soon as they burn out. Set up your furniture so you have a clear path. Avoid moving your furniture around. If any of your floors are uneven, fix them. If there are any pets around you, be aware of where they are. Review your medicines with your doctor. Some medicines can make you feel dizzy. This can increase your chance of falling. Ask your doctor what other things that you can do to help prevent falls. This information is not intended to replace advice given to you by your health care provider. Make sure you discuss any questions you have with your health care provider. Document Released: 10/02/2009 Document Revised: 05/13/2016 Document Reviewed: 01/10/2015 Elsevier Interactive Patient Education  2017 [employer].

## 2022-01-19 DIAGNOSIS — M7122 Synovial cyst of popliteal space [Baker], left knee: Secondary | ICD-10-CM | POA: Diagnosis not present

## 2022-01-19 DIAGNOSIS — M25562 Pain in left knee: Secondary | ICD-10-CM | POA: Diagnosis not present

## 2022-01-19 DIAGNOSIS — S83249A Other tear of medial meniscus, current injury, unspecified knee, initial encounter: Secondary | ICD-10-CM | POA: Insufficient documentation

## 2022-01-19 DIAGNOSIS — S83232D Complex tear of medial meniscus, current injury, left knee, subsequent encounter: Secondary | ICD-10-CM | POA: Diagnosis not present

## 2022-01-26 DIAGNOSIS — M7122 Synovial cyst of popliteal space [Baker], left knee: Secondary | ICD-10-CM | POA: Diagnosis not present

## 2022-02-11 ENCOUNTER — Telehealth (INDEPENDENT_AMBULATORY_CARE_PROVIDER_SITE_OTHER): Payer: Self-pay | Admitting: *Deleted

## 2022-02-12 ENCOUNTER — Other Ambulatory Visit (INDEPENDENT_AMBULATORY_CARE_PROVIDER_SITE_OTHER): Payer: Self-pay | Admitting: *Deleted

## 2022-02-12 DIAGNOSIS — B181 Chronic viral hepatitis B without delta-agent: Secondary | ICD-10-CM

## 2022-02-12 DIAGNOSIS — B1910 Unspecified viral hepatitis B without hepatic coma: Secondary | ICD-10-CM

## 2022-02-12 DIAGNOSIS — K769 Liver disease, unspecified: Secondary | ICD-10-CM

## 2022-02-16 ENCOUNTER — Ambulatory Visit (INDEPENDENT_AMBULATORY_CARE_PROVIDER_SITE_OTHER): Payer: PPO | Admitting: Internal Medicine

## 2022-02-16 ENCOUNTER — Encounter (INDEPENDENT_AMBULATORY_CARE_PROVIDER_SITE_OTHER): Payer: Self-pay | Admitting: Internal Medicine

## 2022-02-16 ENCOUNTER — Other Ambulatory Visit: Payer: Self-pay

## 2022-02-16 VITALS — BP 132/80 | HR 72 | Temp 99.2°F | Ht 62.0 in | Wt 160.0 lb

## 2022-02-16 DIAGNOSIS — K7400 Hepatic fibrosis, unspecified: Secondary | ICD-10-CM | POA: Insufficient documentation

## 2022-02-16 DIAGNOSIS — L84 Corns and callosities: Secondary | ICD-10-CM | POA: Diagnosis not present

## 2022-02-16 DIAGNOSIS — B181 Chronic viral hepatitis B without delta-agent: Secondary | ICD-10-CM

## 2022-02-16 DIAGNOSIS — M2042 Other hammer toe(s) (acquired), left foot: Secondary | ICD-10-CM | POA: Diagnosis not present

## 2022-02-16 NOTE — Patient Instructions (Signed)
Physician will call with results of blood test and [place] with elastography

## 2022-02-16 NOTE — Progress Notes (Signed)
Presenting complaint;  Follow for chronic B hepatitis  Database and subjective:  Patient is 71 year old Caucasian female who is here for scheduled visit.  She was last seen 6 months ago. She has history of hepatitis B dating back to 45.  She failed interferon therapy. Liver biopsy in April last year revealed moderately active chronic hepatitis with advanced fibrosis.  She was felt to have stage III-IV disease.  Therefore antiviral therapy was recommended and she was begun on Entecavir on 04/14/2021. She responded to antiviral therapy and HBV DNA by PCR have been negative.  Patient states she was not able to have a blood drawn because of her joint problems.  She is having severe left hip pain.  She had PRP therapy which has not helped.  She had similar therapy for her knees and it helped a great deal. She remains with good appetite.  Her weight has been stable.  She denies abdominal pain pruritus nausea or vomiting.  Her bowels are regular.  She denies melena or rectal bleeding. She is not having any side effects with antiviral.  Current Medications: Outpatient Encounter Medications as of 02/16/2022  Medication Sig   albuterol (PROAIR HFA) 108 (90 Base) MCG/ACT inhaler Inhale 2 puffs into the lungs every 6 (six) hours as needed for wheezing or shortness of breath.   Atogepant (QULIPTA) 60 MG TABS Take 1 tablet by mouth daily.   BARACLUDE 0.5 MG tablet TAKE 1 TABLET(0.5 MG) BY MOUTH DAILY   cholecalciferol (VITAMIN D3) 25 MCG (1000 UNIT) tablet Take 1,000 Units by mouth daily.   clobetasol cream (TEMOVATE) 4.81 % Apply 1 application topically 2 (two) times daily.   fluticasone (FLONASE) 50 MCG/ACT nasal spray Place 2 sprays into both nostrils daily. (Patient taking differently: Place 2 sprays into both nostrils daily as needed for allergies.)   ibandronate (BONIVA) 150 MG tablet Take 1 tablet (150 mg total) by mouth every 30 (thirty) days. Take in the morning with a full glass of water, on an  empty stomach, and do not take anything else by mouth or lie down for the next 30 min.   levocetirizine (XYZAL) 5 MG tablet TAKE 1 TABLET(5 MG) BY MOUTH EVERY EVENING   magnesium oxide (MAG-OX) 400 MG tablet Take 400 mg by mouth daily.   Multiple Vitamins-Minerals (HAIR/SKIN/NAILS/BIOTIN PO) Take 1 each by mouth in the morning, at noon, and at bedtime.   mupirocin ointment (BACTROBAN) 2 % Apply 1 application topically 2 (two) times daily.   polyethylene glycol (MIRALAX / GLYCOLAX) 17 g packet Take 4.25 g by mouth daily.   polyethylene glycol powder (GLYCOLAX/MIRALAX) 17 GM/SCOOP powder polyethylene glycol 3350 17 gram oral powder packet   4.25 g by oral route.   RELPAX 40 MG tablet May repeat in 2 hours if headache persists or recurs.   tretinoin (RETIN-A) 0.1 % cream Apply 1 application topically at bedtime as needed. Patient states that she uses sporadic   zolpidem (AMBIEN) 5 MG tablet Take 1 tablet (5 mg total) by mouth at bedtime as needed for sleep.   [DISCONTINUED] Clobetasol Prop Emollient Base 0.05 % emollient cream Apply 1 application topically 2 (two) times daily as needed (irritation).   [DISCONTINUED] cyclobenzaprine (FLEXERIL) 5 MG tablet Take 1 tablet (5 mg total) by mouth 3 (three) times daily as needed for muscle spasms.   [DISCONTINUED] naproxen (NAPROSYN) 500 MG tablet Take 1 tablet (500 mg total) by mouth 2 (two) times daily with a meal.   [DISCONTINUED] predniSONE (DELTASONE) 20 MG  tablet prednisone 20 mg tablet   No facility-administered encounter medications on file as of 02/16/2022.     Objective: Blood pressure 132/80, pulse 72, temperature 99.2 F (37.3 C), temperature source Oral, height 5' 2"  (1.575 m), weight 160 lb (72.6 kg). Patient is alert and in no acute distress. She is keeping her left leg elevated for relief. Conjunctiva is pink. Sclera is nonicteric Oropharyngeal mucosa is normal. No neck masses or thyromegaly noted. Cardiac exam with regular rhythm  normal S1 and S2. No murmur or gallop noted. Lungs are clear to auscultation. Abdomen is soft and nontender with organomegaly or masses. No LE edema or clubbing noted.  Labs/studies Results:   CBC Latest Ref Rng & Units 11/18/2021 07/15/2021 03/30/2021  WBC 3.8 - 10.8 Thousand/uL 3.6(L) 5.4 4.2  Hemoglobin 11.7 - 15.5 g/dL 15.2 15.4 15.2(H)  Hematocrit 35.0 - 45.0 % 44.5 44.5 46.9(H)  Platelets 140 - 400 Thousand/uL 261 288 256    CMP Latest Ref Rng & Units 11/18/2021 07/15/2021 02/16/2021  Glucose 65 - 99 mg/dL 76 86 -  BUN 7 - 25 mg/dL 16 20 -  Creatinine 0.60 - 1.00 mg/dL 0.87 0.85 -  Sodium 135 - 146 mmol/L 141 138 -  Potassium 3.5 - 5.3 mmol/L 4.4 4.2 -  Chloride 98 - 110 mmol/L 105 104 -  CO2 20 - 32 mmol/L 29 27 -  Calcium 8.6 - 10.4 mg/dL 9.0 9.7 -  Total Protein 6.1 - 8.1 g/dL 6.4 6.7 6.7  Total Bilirubin 0.2 - 1.2 mg/dL 0.6 0.8 0.6  Alkaline Phos 44 - 121 IU/L - - -  AST 10 - 35 U/L 21 23 29   ALT 6 - 29 U/L 15 17 23     Hepatic Function Latest Ref Rng & Units 11/18/2021 07/15/2021 02/16/2021  Total Protein 6.1 - 8.1 g/dL 6.4 6.7 6.7  Albumin 3.7 - 4.7 g/dL - - -  AST 10 - 35 U/L 21 23 29   ALT 6 - 29 U/L 15 17 23   Alk Phosphatase 44 - 121 IU/L - - -  Total Bilirubin 0.2 - 1.2 mg/dL 0.6 0.8 0.6  Bilirubin, Direct 0.0 - 0.2 mg/dL - - 0.1      Assessment:  #1.  Chronic hepatitis B with stage III-IV disease.  She will be completing 1 year of antiviral therapy with Entecavir in April 2023.  She seems to be responding.  She is due for blood work. Since she has stage III-IV disease she should be screened for [facility] every 6 months unless fibrosis reverses with therapy. If she seroconvert's she will continue therapy for 1 more year and stop but if she does not she will stay on this therapy at least 5 years or maybe indefinitely unless she develops resistance  Plan:  Patient will go to the lab for blood work when she can. Schedule abdominal ultrasound with elastography. Office  visit with Dr. Jenetta Downer in 6 months.

## 2022-02-17 NOTE — Telephone Encounter (Signed)
error 

## 2022-02-18 DIAGNOSIS — B181 Chronic viral hepatitis B without delta-agent: Secondary | ICD-10-CM | POA: Diagnosis not present

## 2022-02-18 DIAGNOSIS — K769 Liver disease, unspecified: Secondary | ICD-10-CM | POA: Diagnosis not present

## 2022-02-18 DIAGNOSIS — B1910 Unspecified viral hepatitis B without hepatic coma: Secondary | ICD-10-CM | POA: Diagnosis not present

## 2022-02-22 DIAGNOSIS — M25562 Pain in left knee: Secondary | ICD-10-CM | POA: Diagnosis not present

## 2022-02-22 LAB — COMPREHENSIVE METABOLIC PANEL
AG Ratio: 1.8 (calc) (ref 1.0–2.5)
ALT: 14 U/L (ref 6–29)
AST: 21 U/L (ref 10–35)
Albumin: 4.3 g/dL (ref 3.6–5.1)
Alkaline phosphatase (APISO): 64 U/L (ref 37–153)
BUN: 19 mg/dL (ref 7–25)
CO2: 28 mmol/L (ref 20–32)
Calcium: 9.4 mg/dL (ref 8.6–10.4)
Chloride: 102 mmol/L (ref 98–110)
Creat: 0.81 mg/dL (ref 0.60–1.00)
Globulin: 2.4 g/dL (calc) (ref 1.9–3.7)
Glucose, Bld: 90 mg/dL (ref 65–99)
Potassium: 4.3 mmol/L (ref 3.5–5.3)
Sodium: 140 mmol/L (ref 135–146)
Total Bilirubin: 0.7 mg/dL (ref 0.2–1.2)
Total Protein: 6.7 g/dL (ref 6.1–8.1)

## 2022-02-22 LAB — HEPATITIS B DNA, ULTRAQUANTITATIVE, PCR
Hepatitis B DNA (Calc): <1 Log IU/mL
Hepatitis B DNA: <10 IU/mL

## 2022-02-22 LAB — CBC WITH DIFFERENTIAL/PLATELET
Absolute Monocytes: 316 cells/uL (ref 200–950)
Basophils Absolute: 40 cells/uL (ref 0–200)
Basophils Relative: 1 %
Eosinophils Absolute: 108 cells/uL (ref 15–500)
Eosinophils Relative: 2.7 %
HCT: 46.7 % — ABNORMAL HIGH (ref 35.0–45.0)
Hemoglobin: 16 g/dL — ABNORMAL HIGH (ref 11.7–15.5)
Lymphs Abs: 1032 cells/uL (ref 850–3900)
MCH: 30.5 pg (ref 27.0–33.0)
MCHC: 34.3 g/dL (ref 32.0–36.0)
MCV: 89.1 fL (ref 80.0–100.0)
MPV: 10.9 fL (ref 7.5–12.5)
Monocytes Relative: 7.9 %
Neutro Abs: 2504 cells/uL (ref 1500–7800)
Neutrophils Relative %: 62.6 %
Platelets: 289 10*3/uL (ref 140–400)
RBC: 5.24 10*6/uL — ABNORMAL HIGH (ref 3.80–5.10)
RDW: 12.5 % (ref 11.0–15.0)
Total Lymphocyte: 25.8 %
WBC: 4 10*3/uL (ref 3.8–10.8)

## 2022-02-22 LAB — HEPATITIS B SURFACE ANTIGEN: Hepatitis B Surface Ag: REACTIVE — AB

## 2022-02-25 DIAGNOSIS — L820 Inflamed seborrheic keratosis: Secondary | ICD-10-CM | POA: Diagnosis not present

## 2022-03-02 ENCOUNTER — Ambulatory Visit (HOSPITAL_COMMUNITY): Payer: PPO

## 2022-03-04 ENCOUNTER — Other Ambulatory Visit: Payer: Self-pay | Admitting: Internal Medicine

## 2022-03-11 ENCOUNTER — Other Ambulatory Visit: Payer: Self-pay

## 2022-03-11 ENCOUNTER — Ambulatory Visit (HOSPITAL_COMMUNITY)
Admission: RE | Admit: 2022-03-11 | Discharge: 2022-03-11 | Disposition: A | Discharge status: Home / Self Care | Payer: PPO | Source: Ambulatory Visit | Attending: Internal Medicine | Admitting: Internal Medicine

## 2022-03-11 DIAGNOSIS — K7400 Hepatic fibrosis, unspecified: Secondary | ICD-10-CM | POA: Diagnosis not present

## 2022-03-11 DIAGNOSIS — B181 Chronic viral hepatitis B without delta-agent: Secondary | ICD-10-CM | POA: Diagnosis not present

## 2022-03-11 DIAGNOSIS — K7689 Other specified diseases of liver: Secondary | ICD-10-CM | POA: Diagnosis not present

## 2022-03-17 ENCOUNTER — Other Ambulatory Visit: Payer: Self-pay | Admitting: *Deleted

## 2022-03-17 ENCOUNTER — Telehealth: Payer: Self-pay | Admitting: Internal Medicine

## 2022-03-17 DIAGNOSIS — N904 Leukoplakia of vulva: Secondary | ICD-10-CM

## 2022-03-17 DIAGNOSIS — E559 Vitamin D deficiency, unspecified: Secondary | ICD-10-CM | POA: Diagnosis not present

## 2022-03-17 DIAGNOSIS — E782 Mixed hyperlipidemia: Secondary | ICD-10-CM | POA: Diagnosis not present

## 2022-03-17 DIAGNOSIS — Z0001 Encounter for general adult medical examination with abnormal findings: Secondary | ICD-10-CM | POA: Diagnosis not present

## 2022-03-17 MED ORDER — MUPIROCIN 2 % EX OINT: 1.0000 "application " | TOPICAL_OINTMENT | Freq: Two times a day (BID) | CUTANEOUS | 0 refills | Status: DC

## 2022-03-17 NOTE — Telephone Encounter (Signed)
Medication sent to [facility] ? ?

## 2022-03-17 NOTE — Telephone Encounter (Signed)
Patient called in for refill on  ? ?mupirocin ointment (BACTROBAN) 2 % ? ? ?[place] ?

## 2022-03-18 LAB — CMP14+EGFR
ALT: 20 IU/L (ref 0–32)
AST: 25 IU/L (ref 0–40)
Albumin/Globulin Ratio: 2.3 — ABNORMAL HIGH (ref 1.2–2.2)
Albumin: 4.5 g/dL (ref 3.7–4.7)
Alkaline Phosphatase: 70 IU/L (ref 44–121)
BUN/Creatinine Ratio: 20 (ref 12–28)
BUN: 19 mg/dL (ref 8–27)
Bilirubin Total: 0.7 mg/dL (ref 0.0–1.2)
CO2: 28 mmol/L (ref 20–29)
Calcium: 9.6 mg/dL (ref 8.7–10.3)
Chloride: 102 mmol/L (ref 96–106)
Creatinine, Ser: 0.97 mg/dL (ref 0.57–1.00)
Globulin, Total: 2 g/dL (ref 1.5–4.5)
Glucose: 90 mg/dL (ref 70–99)
Potassium: 4.7 mmol/L (ref 3.5–5.2)
Sodium: 142 mmol/L (ref 134–144)
Total Protein: 6.5 g/dL (ref 6.0–8.5)
eGFR: 62 mL/min/{1.73_m2} (ref >59)

## 2022-03-18 LAB — LIPID PANEL
Chol/HDL Ratio: 2.9 ratio (ref 0.0–4.4)
Cholesterol, Total: 214 mg/dL — ABNORMAL HIGH (ref 100–199)
HDL: 73 mg/dL (ref >39)
LDL Chol Calc (NIH): 122 mg/dL — ABNORMAL HIGH (ref 0–99)
Triglycerides: 110 mg/dL (ref 0–149)
VLDL Cholesterol Cal: 19 mg/dL (ref 5–40)

## 2022-03-18 LAB — CBC WITH DIFFERENTIAL/PLATELET
Basophils Absolute: 0 10*3/uL (ref 0.0–0.2)
Basos: 1 %
EOS (ABSOLUTE): 0.1 10*3/uL (ref 0.0–0.4)
Eos: 3 %
Hematocrit: 46.5 % (ref 34.0–46.6)
Hemoglobin: 16 g/dL — ABNORMAL HIGH (ref 11.1–15.9)
Immature Grans (Abs): 0 10*3/uL (ref 0.0–0.1)
Immature Granulocytes: 0 %
Lymphocytes Absolute: 0.8 10*3/uL (ref 0.7–3.1)
Lymphs: 19 %
MCH: 31 pg (ref 26.6–33.0)
MCHC: 34.4 g/dL (ref 31.5–35.7)
MCV: 90 fL (ref 79–97)
Monocytes Absolute: 0.4 10*3/uL (ref 0.1–0.9)
Monocytes: 10 %
Neutrophils Absolute: 2.7 10*3/uL (ref 1.4–7.0)
Neutrophils: 67 %
Platelets: 277 10*3/uL (ref 150–450)
RBC: 5.16 x10E6/uL (ref 3.77–5.28)
RDW: 12.7 % (ref 11.7–15.4)
WBC: 3.9 10*3/uL (ref 3.4–10.8)

## 2022-03-18 LAB — HEMOGLOBIN A1C
Est. average glucose Bld gHb Est-mCnc: 100 mg/dL
Hgb A1c MFr Bld: 5.1 % (ref 4.8–5.6)

## 2022-03-18 LAB — VITAMIN D 25 HYDROXY (VIT D DEFICIENCY, FRACTURES): Vit D, 25-Hydroxy: 38.7 ng/mL (ref 30.0–100.0)

## 2022-03-18 LAB — TSH: TSH: 1.76 u[IU]/mL (ref 0.450–4.500)

## 2022-03-25 ENCOUNTER — Ambulatory Visit: Payer: PPO | Admitting: Internal Medicine

## 2022-03-25 DIAGNOSIS — L820 Inflamed seborrheic keratosis: Secondary | ICD-10-CM | POA: Diagnosis not present

## 2022-03-26 ENCOUNTER — Ambulatory Visit (HOSPITAL_COMMUNITY)
Admission: RE | Admit: 2022-03-26 | Discharge: 2022-03-26 | Disposition: A | Discharge status: Home / Self Care | Payer: PPO | Source: Ambulatory Visit | Attending: Internal Medicine | Admitting: Internal Medicine

## 2022-03-26 DIAGNOSIS — R2242 Localized swelling, mass and lump, left lower limb: Secondary | ICD-10-CM | POA: Diagnosis not present

## 2022-03-26 DIAGNOSIS — M7989 Other specified soft tissue disorders: Secondary | ICD-10-CM | POA: Diagnosis not present

## 2022-03-26 DIAGNOSIS — M25562 Pain in left knee: Secondary | ICD-10-CM | POA: Diagnosis not present

## 2022-03-30 ENCOUNTER — Ambulatory Visit: Payer: PPO | Admitting: Family Medicine

## 2022-04-08 DIAGNOSIS — M25562 Pain in left knee: Secondary | ICD-10-CM | POA: Diagnosis not present

## 2022-04-27 ENCOUNTER — Encounter: Payer: Self-pay | Admitting: Internal Medicine

## 2022-04-28 ENCOUNTER — Encounter: Payer: Self-pay | Admitting: Internal Medicine

## 2022-04-28 ENCOUNTER — Ambulatory Visit (INDEPENDENT_AMBULATORY_CARE_PROVIDER_SITE_OTHER): Payer: PPO | Admitting: Internal Medicine

## 2022-04-28 VITALS — BP 124/72 | HR 85 | Resp 18 | Ht 61.0 in | Wt 163.4 lb

## 2022-04-28 DIAGNOSIS — E663 Overweight: Secondary | ICD-10-CM | POA: Diagnosis not present

## 2022-04-28 DIAGNOSIS — M810 Age-related osteoporosis without current pathological fracture: Secondary | ICD-10-CM | POA: Diagnosis not present

## 2022-04-28 DIAGNOSIS — M7122 Synovial cyst of popliteal space [Baker], left knee: Secondary | ICD-10-CM | POA: Diagnosis not present

## 2022-04-28 DIAGNOSIS — S83232D Complex tear of medial meniscus, current injury, left knee, subsequent encounter: Secondary | ICD-10-CM | POA: Diagnosis not present

## 2022-04-28 MED ORDER — WEGOVY 0.25 MG/0.5ML ~~LOC~~ SOAJ: 0.2500 mg | SUBCUTANEOUS | 0 refills | Status: DC

## 2022-04-28 NOTE — Patient Instructions (Signed)
Please continue taking medications as prescribed. ? ?You are being referred to Orthopedic surgeon for knee pain and swelling. ?

## 2022-04-29 ENCOUNTER — Encounter: Payer: Self-pay | Admitting: Internal Medicine

## 2022-04-29 NOTE — Assessment & Plan Note (Signed)
Noted on MRI of knee ?She has persistent persistent pain and swelling of the knee ?Referred to orthopedic surgery ?

## 2022-04-29 NOTE — Assessment & Plan Note (Signed)
Advised to continue low-carb diet and ambulate as tolerated ?She is adamant about starting [facility], will try to get prior authorization ?

## 2022-04-29 NOTE — Assessment & Plan Note (Addendum)
Last DEXA scan reviewed On Boniva now On Calcium and Vitamin D supplement (Caltrate) 

## 2022-04-29 NOTE — Assessment & Plan Note (Signed)
Has a history of Baker's cyst noted on MRI of the knee ?Had US guided drainage of cyst in the past, but has worsening of the popliteal area swelling since ?Referred to orthopedic surgery -she has only seen sports medicine yet ?

## 2022-04-29 NOTE — Progress Notes (Signed)
? ?Established Patient Office Visit ? ?Subjective:  ?Patient ID: Alyssa Durham, female    DOB: 21-May-1951  Age: 71 y.o. MRN: [id number] ? ?CC:  ?Chief Complaint  ?Patient presents with  ? Follow-up  ?  6 month follow up pt wants provider to look at swelling behind left knee sees ortho for this   ? ? ?HPI ?Alyssa Durham is a 71 y.o. female with past medical history of chronic migraine, mild intermittent asthma related to allergies, osteoporosis, chronic hep B, lichen sclerosus, and gluteal tendinitis of right buttock who presents for f/u of her chronic medical conditions. ? ?She complains of chronic knee pain, with swelling in the back of the knee.  She had MRI of the knee at [facility], which showed complex tear of the medial meniscal ligament and small Baker's cyst.  She had drainage of the cyst under US guidance in 02/23.  She states that she has worsening of the left knee swelling in the popliteal area since then.  She continues to have severe chronic knee pain, worse with standing and movement.  She has tried taking naproxen with some relief. ? ?She is concerned about her weight.  She wants to lose weight, but states that she is not able to exercise due to her severe knee pain.  She has inquired about Ozempic and Wegovy with her insurance, and states that she would like to take [facility] for weight loss.  I had lengthy discussion about it, but she is adamant about using [facility] for now. ? ?She has been taking Boniva for osteoporosis.  She also takes calcium and vitamin D supplement. ? ? ? ? ?Past Medical History:  ?Diagnosis Date  ? Allergy   ? Anxiety and depression   ? Arthritis   ? Asthma   ? Cataract   ? Chronic active type B viral hepatitis ([place])   ? Depression   ? History of HPV infection 11/15/2017  ? HLD (hyperlipidemia) 05/12/2017  ? Localized swelling of left lower leg 01/01/2021  ? Migraines   ? Osteoporosis   ? Primary hypertension 01/01/2021  ? ? ?Past Surgical History:  ?Procedure Laterality Date   ? CESAREAN SECTION    ? x3  ? LIVER BIOPSY  1993  ? URETHRAL DILATION    ? WISDOM TOOTH EXTRACTION    ? ? ?Family History  ?Problem Relation Age of Onset  ? Uterine cancer Mother   ? Migraines Father   ? Early death Father 39  ?     suicide   ? Transient ischemic attack Father   ?     multiple   ? Migraines Brother   ? Healthy Daughter   ? Drug abuse Son   ?     addict - stable on suboxone  ? Hepatitis B Son   ? Healthy Son   ? Cancer Maternal Grandmother   ?     breast  ? Alcohol abuse Paternal Grandfather   ? Early death Paternal Aunt   ?     suicide  ? Mental illness Paternal Aunt   ? ? ?Social History  ? ?Socioeconomic History  ? Marital status: Divorced  ?  Spouse name: Not on file  ? Number of children: 3  ? Years of education: 71  ? Highest education level: Not on file  ?Occupational History  ? Occupation: retired  ?  Comment: Admin assistant  ?Tobacco Use  ? Smoking status: Former  ?  Packs/day: 2.00  ?  Years: 5.00  ?  Pack years: 10.00  ?  Types: Cigarettes  ?  Start date: 12/21/1967  ?  Quit date: 12/20/1972  ?  Years since quitting: 49.3  ? Smokeless tobacco: Never  ?Vaping Use  ? Vaping Use: Never used  ?Substance and Sexual Activity  ? Alcohol use: No  ? Drug use: No  ? Sexual activity: Not Currently  ?Other Topics Concern  ? Not on file  ?Social History Narrative  ? Retired Web designer  ? Three children  [place], [place] and [place]  ? Grandchildren - 2  ? Right handed  ? College  ? ?Social Determinants of Health  ? ?Financial Resource Strain: Low Risk   ? Difficulty of Paying Living Expenses: Not hard at all  ?Food Insecurity: No Food Insecurity  ? Worried About Charity fundraiser in the Last Year: Never true  ? Ran Out of Food in the Last Year: Never true  ?Transportation Needs: No Transportation Needs  ? Lack of Transportation (Medical): No  ? Lack of Transportation (Non-Medical): No  ?Physical Activity: Inactive  ? Days of Exercise per Week: 0 days  ? Minutes of Exercise per Session: 0  min  ?Stress: Stress Concern Present  ? Feeling of Stress : To some extent  ?Social Connections: Moderately Integrated  ? Frequency of Communication with Friends and Family: More than three times a week  ? Frequency of Social Gatherings with Friends and Family: More than three times a week  ? Attends Religious Services: More than 4 times per year  ? Active Member of Clubs or Organizations: Yes  ? Attends Archivist Meetings: More than 4 times per year  ? Marital Status: Divorced  ?Intimate Partner Violence: Not At Risk  ? Fear of Current or Ex-Partner: No  ? Emotionally Abused: No  ? Physically Abused: No  ? Sexually Abused: No  ? ? ?Outpatient Medications Prior to Visit  ?Medication Sig Dispense Refill  ? Atogepant (QULIPTA) 60 MG TABS Take 1 tablet by mouth daily. 30 tablet 11  ? BARACLUDE 0.5 MG tablet TAKE 1 TABLET(0.5 MG) BY MOUTH DAILY 30 tablet 11  ? cholecalciferol (VITAMIN D3) 25 MCG (1000 UNIT) tablet Take 1,000 Units by mouth daily.    ? ibandronate (BONIVA) 150 MG tablet TAKE (1) TABLET ONCE A MONTH ONLY. TAKE WITH 6 TO 8 OZ OF WATER AND DO NOT LAY DOWN FOR 1 HOUR. 3 tablet 0  ? magnesium oxide (MAG-OX) 400 MG tablet Take 400 mg by mouth daily.    ? Multiple Vitamins-Minerals (HAIR/SKIN/NAILS/BIOTIN PO) Take 1 each by mouth in the morning, at noon, and at bedtime.    ? mupirocin ointment (BACTROBAN) 2 % Apply 1 application. topically 2 (two) times daily. 22 g 0  ? polyethylene glycol (MIRALAX / GLYCOLAX) 17 g packet Take 4.25 g by mouth daily.    ? RELPAX 40 MG tablet May repeat in 2 hours if headache persists or recurs. 12 tablet 11  ? tretinoin (RETIN-A) 0.1 % cream Apply 1 application topically at bedtime as needed. Patient states that she uses sporadic    ? zolpidem (AMBIEN) 5 MG tablet Take 1 tablet (5 mg total) by mouth at bedtime as needed for sleep. 30 tablet 1  ? albuterol (PROAIR HFA) 108 (90 Base) MCG/ACT inhaler Inhale 2 puffs into the lungs every 6 (six) hours as needed for  wheezing or shortness of breath. (Patient not taking: Reported on 04/28/2022) 18 g 5  ? fluticasone (  FLONASE) 50 MCG/ACT nasal spray Place 2 sprays into both nostrils daily. (Patient not taking: Reported on 04/28/2022) 16 g 6  ? levocetirizine (XYZAL) 5 MG tablet TAKE 1 TABLET(5 MG) BY MOUTH EVERY EVENING (Patient not taking: Reported on 04/28/2022) 30 tablet 5  ? ?No facility-administered medications prior to visit.  ? ? ?Allergies  ?Allergen Reactions  ? Codeine Nausea And Vomiting and Other (See Comments)  ? ? ?ROS ?Review of Systems  ?Constitutional:  Negative for chills and fever.  ?HENT:  Negative for congestion, postnasal drip, rhinorrhea, sinus pressure, sinus pain and sore throat.   ?Eyes:  Negative for pain and discharge.  ?Respiratory:  Negative for cough and shortness of breath.   ?Cardiovascular:  Negative for chest pain and palpitations.  ?Gastrointestinal:  Negative for abdominal pain, diarrhea, nausea and vomiting.  ?Endocrine: Negative for polydipsia and polyuria.  ?Genitourinary:  Negative for dysuria and hematuria.  ?Musculoskeletal:  Positive for arthralgias, back pain and joint swelling. Negative for neck pain and neck stiffness.  ?Skin:  Negative for rash.  ?Neurological:  Negative for dizziness, seizures, syncope and weakness.  ?Psychiatric/Behavioral:  Negative for agitation and behavioral problems.   ? ?  ?Objective:  ?  ?Physical Exam ?Vitals reviewed.  ?Constitutional:   ?   General: She is not in acute distress. ?   Appearance: She is not diaphoretic.  ?HENT:  ?   Head: Normocephalic and atraumatic.  ?   Nose: Nose normal. No congestion.  ?   Mouth/Throat:  ?   Mouth: Mucous membranes are moist.  ?   Pharynx: No posterior oropharyngeal erythema.  ?Eyes:  ?   General: No scleral icterus. ?   Extraocular Movements: Extraocular movements intact.  ?Cardiovascular:  ?   Rate and Rhythm: Normal rate and regular rhythm.  ?   Pulses: Normal pulses.  ?   Heart sounds: Normal heart sounds. No murmur  heard. ?Pulmonary:  ?   Breath sounds: Normal breath sounds. No wheezing or rales.  ?Musculoskeletal:  ?   Right hand: Swelling (Mild PIP and DIP joints) present.  ?   Left hand: Swelling (Mild PIP and DIP jo

## 2022-04-30 ENCOUNTER — Telehealth: Payer: Self-pay

## 2022-04-30 NOTE — Telephone Encounter (Signed)
Pt called and said that the pharmacy has sent paperwork for prior auth for med.  The pharmacy told her today they still do not have it.  Can you please follow up with her regarding this authorization.  ?

## 2022-05-03 ENCOUNTER — Telehealth: Payer: Self-pay | Admitting: Internal Medicine

## 2022-05-03 NOTE — Telephone Encounter (Signed)
LVM for pt to call the office need medication name have not received prior auth ?

## 2022-05-03 NOTE — Telephone Encounter (Signed)
She is speaking on wegovy let her know we have not received prior auth to reach back out to pharmacy. We have not received prior auth papers. Will come to check height again due to having shoes on that day.  ?

## 2022-05-03 NOTE — Telephone Encounter (Signed)
Pt returning call in regards to the medication prior authorization  ?

## 2022-05-03 NOTE — Telephone Encounter (Signed)
Called pt see other phone message  ?

## 2022-05-05 NOTE — Telephone Encounter (Signed)
lmom 

## 2022-05-06 ENCOUNTER — Telehealth: Payer: Self-pay

## 2022-05-06 ENCOUNTER — Other Ambulatory Visit (INDEPENDENT_AMBULATORY_CARE_PROVIDER_SITE_OTHER): Payer: Self-pay | Admitting: Internal Medicine

## 2022-05-06 ENCOUNTER — Other Ambulatory Visit (INDEPENDENT_AMBULATORY_CARE_PROVIDER_SITE_OTHER): Payer: Self-pay | Admitting: *Deleted

## 2022-05-06 MED ORDER — BARACLUDE 0.5 MG PO TABS: ORAL_TABLET | ORAL | 3 refills | Status: DC

## 2022-05-06 NOTE — Telephone Encounter (Signed)
Last ov 02/16/22

## 2022-05-07 ENCOUNTER — Encounter: Payer: Self-pay | Admitting: Orthopedic Surgery

## 2022-05-07 ENCOUNTER — Ambulatory Visit (INDEPENDENT_AMBULATORY_CARE_PROVIDER_SITE_OTHER): Payer: PPO

## 2022-05-07 ENCOUNTER — Ambulatory Visit: Payer: PPO | Admitting: Orthopedic Surgery

## 2022-05-07 VITALS — Ht 61.0 in | Wt 163.0 lb

## 2022-05-07 DIAGNOSIS — G8929 Other chronic pain: Secondary | ICD-10-CM

## 2022-05-07 DIAGNOSIS — M7122 Synovial cyst of popliteal space [Baker], left knee: Secondary | ICD-10-CM

## 2022-05-07 DIAGNOSIS — S83232A Complex tear of medial meniscus, current injury, left knee, initial encounter: Secondary | ICD-10-CM

## 2022-05-07 DIAGNOSIS — M25562 Pain in left knee: Secondary | ICD-10-CM | POA: Diagnosis not present

## 2022-05-07 NOTE — Patient Instructions (Signed)
Voltaren gel for your knee, available over the counter  Referral to Dr. Thedore Mins for left knee ultrasound  Follow up with Dr. Delilah Shan    Knee Exercises  Ask your health care provider which exercises are safe for you. Do exercises exactly as told by your health care provider and adjust them as directed. It is normal to feel mild stretching, pulling, tightness, or discomfort as you do these exercises. Stop right away if you feel sudden pain or your pain gets worse. Do not begin these exercises until told by your health care provider.  Stretching and range-of-motion exercises These exercises warm up your muscles and joints and improve the movement and flexibility of your knee. These exercises also help to relieve pain and swelling.  Knee extension, prone Lie on your abdomen (prone position) on a bed. Place your left / right knee just beyond the edge of the surface so your knee is not on the bed. You can put a towel under your left / right thigh just above your kneecap for comfort. Relax your leg muscles and allow gravity to straighten your knee (extension). You should feel a stretch behind your left / right knee. Hold this position for 10 seconds. Scoot up so your knee is supported between repetitions. Repeat 10 times. Complete this exercise 3-4 times per week.     Knee flexion, active Lie on your back with both legs straight. If this causes back discomfort, bend your left / right knee so your foot is flat on the floor. Slowly slide your left / right heel back toward your buttocks. Stop when you feel a gentle stretch in the front of your knee or thigh (flexion). Hold this position for 10 seconds. Slowly slide your left / right heel back to the starting position. Repeat 10 times. Complete this exercise 3-4 times per week.      Quadriceps stretch, prone Lie on your abdomen on a firm surface, such as a bed or padded floor. Bend your left / right knee and hold your ankle. If you cannot  reach your ankle or pant leg, loop a belt around your foot and grab the belt instead. Gently pull your heel toward your buttocks. Your knee should not slide out to the side. You should feel a stretch in the front of your thigh and knee (quadriceps). Hold this position for 10 seconds. Repeat 10 times. Complete this exercise 3-4 times per week.      Hamstring, supine Lie on your back (supine position). Loop a belt or towel over the ball of your left / right foot. The ball of your foot is on the walking surface, right under your toes. Straighten your left / right knee and slowly pull on the belt to raise your leg until you feel a gentle stretch behind your knee (hamstring). Do not let your knee bend while you do this. Keep your other leg flat on the floor. Hold this position for 10 seconds. Repeat 10 times. Complete this exercise 3-4 times per week.   Strengthening exercises These exercises build strength and endurance in your knee. Endurance is the ability to use your muscles for a long time, even after they get tired.  Quadriceps, isometric This exercise stretches the muscles in front of your thigh (quadriceps) without moving your knee joint (isometric). Lie on your back with your left / right leg extended and your other knee bent. Put a rolled towel or small pillow under your knee if told by your health care provider.  Slowly tense the muscles in the front of your left / right thigh. You should see your kneecap slide up toward your hip or see increased dimpling just above the knee. This motion will push the back of the knee toward the floor. For 10 seconds, hold the muscle as tight as you can without increasing your pain. Relax the muscles slowly and completely. Repeat 10 times. Complete this exercise 3-4 times per week. .     Straight leg raises This exercise stretches the muscles in front of your thigh (quadriceps) and the muscles that move your hips (hip flexors). Lie on your back  with your left / right leg extended and your other knee bent. Tense the muscles in the front of your left / right thigh. You should see your kneecap slide up or see increased dimpling just above the knee. Your thigh may even shake a bit. Keep these muscles tight as you raise your leg 4-6 inches (10-15 cm) off the floor. Do not let your knee bend. Hold this position for 10 seconds. Keep these muscles tense as you lower your leg. Relax your muscles slowly and completely after each repetition. Repeat 10 times. Complete this exercise 3-4 times per week.  Hamstring, isometric Lie on your back on a firm surface. Bend your left / right knee about 30 degrees. Dig your left / right heel into the surface as if you are trying to pull it toward your buttocks. Tighten the muscles in the back of your thighs (hamstring) to "dig" as hard as you can without increasing any pain. Hold this position for 10 seconds. Release the tension gradually and allow your muscles to relax completely for __________ seconds after each repetition. Repeat 10 times. Complete this exercise 3-4 times per week.  Hamstring curls If told by your health care provider, do this exercise while wearing ankle weights. Begin with 5 lb weights. Then increase the weight by 1 lb (0.5 kg) increments. You can also use an exercise band Lie on your abdomen with your legs straight. Bend your left / right knee as far as you can without feeling pain. Keep your hips flat against the floor. Hold this position for 10 seconds. Slowly lower your leg to the starting position. Repeat 10 times. Complete this exercise 3-4 times per week.      Squats This exercise strengthens the muscles in front of your thigh and knee (quadriceps). Stand in front of a table, with your feet and knees pointing straight ahead. You may rest your hands on the table for balance but not for support. Slowly bend your knees and lower your hips like you are going to sit in a  chair. Keep your weight over your heels, not over your toes. Keep your lower legs upright so they are parallel with the table legs. Do not let your hips go lower than your knees. Do not bend lower than told by your health care provider. If your knee pain increases, do not bend as low. Hold the squat position for 10 seconds. Slowly push with your legs to return to standing. Do not use your hands to pull yourself to standing. Repeat 10 times. Complete this exercise 3-4 times per week .     Wall slides This exercise strengthens the muscles in front of your thigh and knee (quadriceps). Lean your back against a smooth wall or door, and walk your feet out 18-24 inches (46-61 cm) from it. Place your feet hip-width apart. Slowly slide down the wall  or door until your knees bend 90 degrees. Keep your knees over your heels, not over your toes. Keep your knees in line with your hips. Hold this position for 10 seconds. Repeat 10 times. Complete this exercise 3-4 times per week.      Straight leg raises This exercise strengthens the muscles that rotate the leg at the hip and move it away from your body (hip abductors). Lie on your side with your left / right leg in the top position. Lie so your head, shoulder, knee, and hip line up. You may bend your bottom knee to help you keep your balance. Roll your hips slightly forward so your hips are stacked directly over each other and your left / right knee is facing forward. Leading with your heel, lift your top leg 4-6 inches (10-15 cm). You should feel the muscles in your outer hip lifting. Do not let your foot drift forward. Do not let your knee roll toward the ceiling. Hold this position for 10 seconds. Slowly return your leg to the starting position. Let your muscles relax completely after each repetition. Repeat 10 times. Complete this exercise 3-4 times per week.      Straight leg raises This exercise stretches the muscles that move your hips  away from the front of the pelvis (hip extensors). Lie on your abdomen on a firm surface. You can put a pillow under your hips if that is more comfortable. Tense the muscles in your buttocks and lift your left / right leg about 4-6 inches (10-15 cm). Keep your knee straight as you lift your leg. Hold this position for 10 seconds. Slowly lower your leg to the starting position. Let your leg relax completely after each repetition. Repeat 10 times. Complete this exercise 3-4 times per week.

## 2022-05-08 ENCOUNTER — Encounter: Payer: Self-pay | Admitting: Orthopedic Surgery

## 2022-05-08 NOTE — Progress Notes (Signed)
New Patient Visit  Assessment: Barbarita Keisha Amer is a 71 y.o. female with the following: 1. Complex tear of medial meniscus of left knee as current injury, initial encounter 2. Baker's cyst of knee, left  Plan: Alka Somers Boyte has pain in her left knee.  It is worst in the posterior aspect of her knee.  She also has some pain in the medial aspect of her knee.  She has had a Baker's cyst aspirated under ultrasound guidance, in the past, and she is adamant that it has returned.  On my physical exam, I do not appreciate a cystic structure.  There is some swelling in the posterior knee, but I do not appreciate recurrence of the cyst.  She also has tenderness along the medial joint line, and is complaining of pain within the medial knee.  Previous MRI demonstrates a complex tear of the medial meniscus.  She is interested in having the posterior aspect of the knee evaluated, with possible aspiration and injection of the cyst.  She is scheduled to see Dr. Delilah Shan within the next month, and I have urged her to keep that appointment.  Pending the procedure and injection, as well as her follow-up with Dr. Delilah Shan, I am happy to see her at any time.  Follow-up: Return if symptoms worsen or fail to improve.  Subjective:  Chief Complaint  Patient presents with   Knee Pain    Lt knee pain pain for over a yr.     History of Present Illness: Takara Ori Kreiter is a 71 y.o. female who has been referred by  Ihor Dow, MD for evaluation of left knee pain.  Her pain is primarily in the posterior aspect of her knee.  She also some pain in the medial aspect of her knee.  She has seen Dr. Delilah Shan, and had a PRP injection in her left knee.  However, this has worsened her symptoms.  In addition, she has had a Baker's cyst aspirated.  She feels as though the cyst has returned in the posterior aspect of the knee.  In regards to the worsening pain, Dr. Delilah Shan feels as though this is a hyper inflammatory response to the  PRP injection.  He is advising more time.  Nonetheless, she presents for a second opinion.  MRI also demonstrates that she has a complex tear of the medial meniscus.  She is feels swelling in the back of her knee.  She is not taking medications on a consistent basis.  She has not had a steroid injection.   Review of Systems: No fevers or chills No numbness or tingling No chest pain No shortness of breath No bowel or bladder dysfunction No GI distress No headaches   Medical History:  Past Medical History:  Diagnosis Date   Allergy    Anxiety and depression    Arthritis    Asthma    Cataract    Chronic active type B viral hepatitis ([place])    Depression    History of HPV infection 11/15/2017   HLD (hyperlipidemia) 05/12/2017   Localized swelling of left lower leg 01/01/2021   Migraines    Osteoporosis    Primary hypertension 01/01/2021    Past Surgical History:  Procedure Laterality Date   CESAREAN SECTION     x3   LIVER BIOPSY  1993   URETHRAL DILATION     WISDOM TOOTH EXTRACTION      Family History  Problem Relation Age of Onset   Uterine cancer Mother  Migraines Father    Early death Father 8       suicide    Transient ischemic attack Father        multiple    Migraines Brother    Healthy Daughter    Drug abuse Son        addict - stable on suboxone   Hepatitis B Son    Healthy Son    Cancer Maternal Grandmother        breast   Alcohol abuse Paternal Grandfather    Early death Paternal Aunt        suicide   Mental illness Paternal Aunt    Social History   Tobacco Use   Smoking status: Former    Packs/day: 2.00    Years: 5.00    Pack years: 10.00    Types: Cigarettes    Start date: 12/21/1967    Quit date: 12/20/1972    Years since quitting: 49.4   Smokeless tobacco: Never  Vaping Use   Vaping Use: Never used  Substance Use Topics   Alcohol use: No   Drug use: No    Allergies  Allergen Reactions   Codeine Nausea And Vomiting and Other (See  Comments)    No outpatient medications have been marked as taking for the 05/07/22 encounter (Office Visit) with Mordecai Rasmussen, MD.    Objective: Ht '5\' 1"'$  (1.549 m)   Wt 163 lb (73.9 kg)   BMI 30.80 kg/m   Physical Exam:  General: Alert and oriented. and No acute distress. Gait: Normal gait.  Evaluation of left knee demonstrates some swelling in the posterior aspect of her knee.  There is no cystic structure within the popliteal fossa.  She does have tenderness to palpation.  She also is tenderness to palpation on the medial joint line.  She has full range of motion.  Negative Lachman.  Pain with hyperflexion of the left knee.  IMAGING: I personally ordered and reviewed the following images  X-rays of the left knee were obtained in clinic today.  Neutral overall alignment.  Mild loss of joint space within the medial compartment.  No acute injuries are noted.  Impression: Mild left knee arthritis, primarily within the medial compartment.   MRI was previously obtained.  She has a complex tear of the posterior horn of the medial meniscus.  There is also advanced chondromalacia within the patellofemoral compartment.  New Medications:  No orders of the defined types were placed in this encounter.     Mordecai Rasmussen, MD  05/08/2022 10:11 PM

## 2022-05-12 ENCOUNTER — Telehealth: Payer: Self-pay

## 2022-05-12 NOTE — Telephone Encounter (Signed)
Pt would like to go over lab results since this was not done at her visit also would like to know if she can have dexa now this was done in 21 and also would like to know if you will order rheumatoid factor on blood work

## 2022-05-12 NOTE — Telephone Encounter (Signed)
Patient called needs for nurse to return her call. Patient talked about her knee in her last visit with Dr Posey Pronto, did not go over any blood work. Patient seen in [place], asking does she need to get a bone density test done and also to ask provider can add a certain lab order. Patient return call [phone].

## 2022-05-13 ENCOUNTER — Other Ambulatory Visit: Payer: Self-pay | Admitting: *Deleted

## 2022-05-13 DIAGNOSIS — Z780 Asymptomatic menopausal state: Secondary | ICD-10-CM

## 2022-05-13 NOTE — Telephone Encounter (Signed)
Dexa scan ordered please schedule for pt and let her know

## 2022-05-13 NOTE — Telephone Encounter (Signed)
Per radiology can not have until after July 03, 2022.  Scheduled appt for July 24.2023 at 10:30 am. Called patient.

## 2022-05-25 DIAGNOSIS — M25562 Pain in left knee: Secondary | ICD-10-CM | POA: Diagnosis not present

## 2022-05-27 DIAGNOSIS — M25562 Pain in left knee: Secondary | ICD-10-CM | POA: Diagnosis not present

## 2022-06-09 ENCOUNTER — Encounter (INDEPENDENT_AMBULATORY_CARE_PROVIDER_SITE_OTHER): Payer: Self-pay | Admitting: Gastroenterology

## 2022-06-09 NOTE — Telephone Encounter (Addendum)
06/10/2022: Patient called today stating you had responded to her over [place] and advised she would not need lab work every three months, but she says she wants them done Q three months as she feels she is on a medication that could cause severe side effect and since she is older she thinks its wise to keep an eye on it every three months. Please advise.   Please advise, Patient had Cbc Cmp, Hep B SAG, Hep B Dna Ultraquantitative pcr done 02/18/2022. Has history of Chronic Hep B and Fibrosis. Has a follow up appointment 08/16/2022 with Dr. Jenetta Downer

## 2022-06-10 ENCOUNTER — Other Ambulatory Visit (INDEPENDENT_AMBULATORY_CARE_PROVIDER_SITE_OTHER): Payer: Self-pay

## 2022-06-11 ENCOUNTER — Encounter (INDEPENDENT_AMBULATORY_CARE_PROVIDER_SITE_OTHER): Payer: Self-pay

## 2022-06-11 ENCOUNTER — Other Ambulatory Visit (INDEPENDENT_AMBULATORY_CARE_PROVIDER_SITE_OTHER): Payer: Self-pay

## 2022-06-11 DIAGNOSIS — K7400 Hepatic fibrosis, unspecified: Secondary | ICD-10-CM

## 2022-06-11 DIAGNOSIS — I10 Essential (primary) hypertension: Secondary | ICD-10-CM

## 2022-06-11 DIAGNOSIS — B181 Chronic viral hepatitis B without delta-agent: Secondary | ICD-10-CM

## 2022-06-15 DIAGNOSIS — I10 Essential (primary) hypertension: Secondary | ICD-10-CM | POA: Diagnosis not present

## 2022-06-15 DIAGNOSIS — B181 Chronic viral hepatitis B without delta-agent: Secondary | ICD-10-CM | POA: Diagnosis not present

## 2022-06-15 DIAGNOSIS — K7400 Hepatic fibrosis, unspecified: Secondary | ICD-10-CM | POA: Diagnosis not present

## 2022-06-17 DIAGNOSIS — M25562 Pain in left knee: Secondary | ICD-10-CM | POA: Diagnosis not present

## 2022-06-18 LAB — COMPREHENSIVE METABOLIC PANEL
AG Ratio: 2 (calc) (ref 1.0–2.5)
ALT: 18 U/L (ref 6–29)
AST: 23 U/L (ref 10–35)
Albumin: 4.2 g/dL (ref 3.6–5.1)
Alkaline phosphatase (APISO): 58 U/L (ref 37–153)
BUN: 24 mg/dL (ref 7–25)
CO2: 26 mmol/L (ref 20–32)
Calcium: 9.5 mg/dL (ref 8.6–10.4)
Chloride: 103 mmol/L (ref 98–110)
Creat: 0.97 mg/dL (ref 0.60–1.00)
Globulin: 2.1 g/dL (calc) (ref 1.9–3.7)
Glucose, Bld: 89 mg/dL (ref 65–99)
Potassium: 4.4 mmol/L (ref 3.5–5.3)
Sodium: 139 mmol/L (ref 135–146)
Total Bilirubin: 0.5 mg/dL (ref 0.2–1.2)
Total Protein: 6.3 g/dL (ref 6.1–8.1)

## 2022-06-18 LAB — CBC WITH DIFFERENTIAL/PLATELET
Absolute Monocytes: 566 cells/uL (ref 200–950)
Basophils Absolute: 47 cells/uL (ref 0–200)
Basophils Relative: 0.8 %
Eosinophils Absolute: 112 cells/uL (ref 15–500)
Eosinophils Relative: 1.9 %
HCT: 44.3 % (ref 35.0–45.0)
Hemoglobin: 15.4 g/dL (ref 11.7–15.5)
Lymphs Abs: 767 cells/uL — ABNORMAL LOW (ref 850–3900)
MCH: 31.5 pg (ref 27.0–33.0)
MCHC: 34.8 g/dL (ref 32.0–36.0)
MCV: 90.6 fL (ref 80.0–100.0)
MPV: 10.8 fL (ref 7.5–12.5)
Monocytes Relative: 9.6 %
Neutro Abs: 4407 cells/uL (ref 1500–7800)
Neutrophils Relative %: 74.7 %
Platelets: 289 10*3/uL (ref 140–400)
RBC: 4.89 10*6/uL (ref 3.80–5.10)
RDW: 12.7 % (ref 11.0–15.0)
Total Lymphocyte: 13 %
WBC: 5.9 10*3/uL (ref 3.8–10.8)

## 2022-06-18 LAB — HEPATITIS B DNA, ULTRAQUANTITATIVE, PCR
Hepatitis B DNA: NOT DETECTED IU/mL
Hepatitis B virus DNA: NOT DETECTED Log IU/mL

## 2022-06-18 LAB — HEPATITIS B SURFACE ANTIGEN: Hepatitis B Surface Ag: REACTIVE — AB

## 2022-06-28 ENCOUNTER — Other Ambulatory Visit: Payer: Self-pay | Admitting: Internal Medicine

## 2022-06-28 DIAGNOSIS — F19982 Other psychoactive substance use, unspecified with psychoactive substance-induced sleep disorder: Secondary | ICD-10-CM

## 2022-07-01 ENCOUNTER — Other Ambulatory Visit: Payer: Self-pay | Admitting: Internal Medicine

## 2022-07-01 DIAGNOSIS — M810 Age-related osteoporosis without current pathological fracture: Secondary | ICD-10-CM

## 2022-07-06 DIAGNOSIS — M1712 Unilateral primary osteoarthritis, left knee: Secondary | ICD-10-CM | POA: Diagnosis not present

## 2022-07-06 DIAGNOSIS — S83232D Complex tear of medial meniscus, current injury, left knee, subsequent encounter: Secondary | ICD-10-CM | POA: Diagnosis not present

## 2022-07-12 ENCOUNTER — Other Ambulatory Visit (HOSPITAL_COMMUNITY): Payer: PPO

## 2022-07-14 ENCOUNTER — Other Ambulatory Visit (HOSPITAL_COMMUNITY): Payer: PPO

## 2022-07-15 DIAGNOSIS — S83242A Other tear of medial meniscus, current injury, left knee, initial encounter: Secondary | ICD-10-CM | POA: Diagnosis not present

## 2022-07-19 ENCOUNTER — Encounter: Payer: Self-pay | Admitting: Internal Medicine

## 2022-07-19 ENCOUNTER — Ambulatory Visit (INDEPENDENT_AMBULATORY_CARE_PROVIDER_SITE_OTHER): Payer: PPO | Admitting: Internal Medicine

## 2022-07-19 DIAGNOSIS — U071 COVID-19: Secondary | ICD-10-CM | POA: Diagnosis not present

## 2022-07-19 MED ORDER — BENZONATATE 100 MG PO CAPS: 100.0000 mg | ORAL_CAPSULE | Freq: Two times a day (BID) | ORAL | 0 refills | Status: DC | PRN

## 2022-07-19 MED ORDER — NIRMATRELVIR/RITONAVIR (PAXLOVID)TABLET: 3.0000 | ORAL_TABLET | Freq: Two times a day (BID) | ORAL | 0 refills | Status: AC

## 2022-07-19 NOTE — Progress Notes (Signed)
Virtual Visit via Telephone Note   This visit type was conducted due to national recommendations for restrictions regarding the COVID-19 Pandemic (e.g. social distancing) in an effort to limit this patient's exposure and mitigate transmission in our community.  Due to her co-morbid illnesses, this patient is at least at moderate risk for complications without adequate follow up.  This format is felt to be most appropriate for this patient at this time.  The patient did not have access to video technology/had technical difficulties with video requiring transitioning to audio format only (telephone).  All issues noted in this document were discussed and addressed.  No physical exam could be performed with this format.  Evaluation Performed:  Follow-up visit  Date:  07/19/2022   ID:  Alyssa Lamekia, [place] Apr 02, 1951, MRN [id number]  Patient Location: Home Provider Location: Office/Clinic  Participants: Patient Location of Patient: Home Location of Provider: Telehealth Consent was obtain for visit to be over via telehealth. I verified that I am speaking with the correct person using two identifiers.  PCP:  Lindell Spar, MD   Chief Complaint: Cough and nasal congestion  History of Present Illness:    Alyssa [place] is a 71 y.o. female who has a televisit for c/o cough, nasal congestion, sore throat and fever X 2 days.  She tested positive for COVID yesterday.  She denies any dyspnea or wheezing currently.  The patient does have symptoms concerning for COVID-19 infection (fever, chills, cough, or new shortness of breath).   Past Medical, Surgical, Social History, Allergies, and Medications have been Reviewed.  Past Medical History:  Diagnosis Date   Allergy    Anxiety and depression    Arthritis    Asthma    Cataract    Chronic active type B viral hepatitis ([place])    Depression    History of HPV infection 11/15/2017   HLD (hyperlipidemia) 05/12/2017   Localized swelling of  left lower leg 01/01/2021   Migraines    Osteoporosis    Primary hypertension 01/01/2021   Past Surgical History:  Procedure Laterality Date   CESAREAN SECTION     x3   LIVER BIOPSY  1993   URETHRAL DILATION     WISDOM TOOTH EXTRACTION       Current Meds  Medication Sig   albuterol (PROAIR HFA) 108 (90 Base) MCG/ACT inhaler Inhale 2 puffs into the lungs every 6 (six) hours as needed for wheezing or shortness of breath.   Atogepant (QULIPTA) 60 MG TABS Take 1 tablet by mouth daily.   BARACLUDE 0.5 MG tablet TAKE 1 TABLET(0.5 MG) BY MOUTH DAILY   cholecalciferol (VITAMIN D3) 25 MCG (1000 UNIT) tablet Take 1,000 Units by mouth daily.   entecavir (BARACLUDE) 0.5 MG tablet TAKE ONE TABLET BY MOUTH ONCE DAILY.   ibandronate (BONIVA) 150 MG tablet TAKE (1) TABLET ONCE A MONTH ONLY. TAKE WITH 6 TO 8 OZ OF WATER AND DO NOT LAY DOWN FOR 1 HOUR.   magnesium oxide (MAG-OX) 400 MG tablet Take 400 mg by mouth daily.   Multiple Vitamins-Minerals (HAIR/SKIN/NAILS/BIOTIN PO) Take 1 each by mouth in the morning, at noon, and at bedtime.   mupirocin ointment (BACTROBAN) 2 % Apply 1 application. topically 2 (two) times daily.   polyethylene glycol (MIRALAX / GLYCOLAX) 17 g packet Take 4.25 g by mouth daily.   RELPAX 40 MG tablet May repeat in 2 hours if headache persists or recurs.   tretinoin (RETIN-A) 0.1 % cream Apply  1 application topically at bedtime as needed. Patient states that she uses sporadic   zolpidem (AMBIEN) 5 MG tablet TAKE ONE TABLET BY MOUTH AT BEDTIME AS NEEDED FOR SLEEP.     Allergies:   Codeine   ROS:   Please see the history of present illness.     All other systems reviewed and are negative.   Labs/Other Tests and Data Reviewed:    Recent Labs: 03/17/2022: TSH 1.760 06/15/2022: ALT 18; BUN 24; Creat 0.97; Hemoglobin 15.4; Platelets 289; Potassium 4.4; Sodium 139   Recent Lipid Panel Lab Results  Component Value Date/Time   CHOL 214 (H) 03/17/2022 10:55 AM   TRIG 110  03/17/2022 10:55 AM   HDL 73 03/17/2022 10:55 AM   CHOLHDL 2.9 03/17/2022 10:55 AM   CHOLHDL 3.2 05/03/2017 10:10 AM   LDLCALC 122 (H) 03/17/2022 10:55 AM    Wt Readings from Last 3 Encounters:  05/07/22 163 lb (73.9 kg)  04/28/22 163 lb 6.4 oz (74.1 kg)  02/16/22 160 lb (72.6 kg)     ASSESSMENT & PLAN:    COVID-19 infection Started Paxlovid Tessalon as needed for cough Advised to take Robitussin as needed for cough Tylenol as needed for fever  Time:   Today, I have spent 9 minutes reviewing the chart, including problem list, medications, and with the patient with telehealth technology discussing the above problems.   Medication Adjustments/Labs and Tests Ordered: Current medicines are reviewed at length with the patient today.  Concerns regarding medicines are outlined above.   Tests Ordered: No orders of the defined types were placed in this encounter.   Medication Changes: No orders of the defined types were placed in this encounter.    Note: This dictation was prepared with Dragon dictation along with smaller phrase technology. Similar sounding words can be transcribed inadequately or may not be corrected upon review. Any transcriptional errors that result from this process are unintentional.      Disposition:  Follow up  Signed, Lindell Spar, MD  07/19/2022 11:58 AM     [place]

## 2022-07-22 ENCOUNTER — Other Ambulatory Visit (HOSPITAL_COMMUNITY): Payer: PPO

## 2022-07-23 ENCOUNTER — Telehealth: Payer: Self-pay

## 2022-07-23 NOTE — Telephone Encounter (Signed)
Patient called covid symptoms went away after one day of dose, feeling better. How long should patient quarantine? Please return patient call back # [phone].

## 2022-07-23 NOTE — Telephone Encounter (Signed)
Looked on CDC website, instructed pt to follow CDC guidelines isolate for 5 days since sx began, wear a mask through day 10 if out in public.

## 2022-08-02 ENCOUNTER — Encounter: Payer: Self-pay | Admitting: Family Medicine

## 2022-08-02 ENCOUNTER — Ambulatory Visit (INDEPENDENT_AMBULATORY_CARE_PROVIDER_SITE_OTHER): Payer: PPO | Admitting: Family Medicine

## 2022-08-02 VITALS — BP 128/80 | HR 97 | Ht 61.0 in | Wt 152.1 lb

## 2022-08-02 DIAGNOSIS — J029 Acute pharyngitis, unspecified: Secondary | ICD-10-CM

## 2022-08-02 DIAGNOSIS — U099 Post covid-19 condition, unspecified: Secondary | ICD-10-CM | POA: Diagnosis not present

## 2022-08-02 LAB — POCT RAPID STREP A (OFFICE): Rapid Strep A Screen: NEGATIVE

## 2022-08-02 NOTE — Assessment & Plan Note (Addendum)
Informed patient about Post-COVID condition  Informed patients that people can experience a broad range of symptoms (physical and mental) and symptom clusters that develop during or after COVID-19 and continue for 2 months (i.e., three months from the onset of illness) Negative strep test Recommended conservative management with:  Warm salt water gargles  3-4 times daily Drink warm beverages and plenty of fluids Use honey to relieve cough  Suck on ice chips, popsicles, or lozenges  Use a clean humidifier or cool mist vaporizer

## 2022-08-02 NOTE — Progress Notes (Signed)
   Acute Office Visit  Subjective:     Patient ID: Lenita Lucita Lora, female    DOB: Apr 07, 1951, 71 y.o.   MRN: [id number]  Chief Complaint  Patient presents with   Sore Throat    Pt c/o sore throat that started on Saturday, has been having drainage, sore throat is on her left side feels like its going up to her ear.      HPI Patient is in today with c/o of sore throat. Sore throat: she was treated for COVID with paxlovid on 07/19/22. Since treatment, she reports feeling well but has recently had new onset of sore throat with difficulty swallowing on 07/31/22. She denies SOB, orthopnea,chest tightness and wheezing. No fever or chills reported. She has had three negative COVID test at home.   Review of Systems  Constitutional:  Positive for malaise/fatigue. Negative for chills, fever and weight loss.  HENT:  Positive for sore throat. Negative for congestion and sinus pain.   Respiratory:  Negative for cough, shortness of breath and wheezing.   Cardiovascular:  Negative for chest pain.  Gastrointestinal:  Negative for abdominal pain.        Objective:    BP 128/80 (BP Location: Left Arm)   Pulse 97   Ht '5\' 1"'$  (1.549 m)   Wt 152 lb 1.3 oz (69 kg)   SpO2 95%   BMI 28.74 kg/m    Physical Exam HENT:     Head: Normocephalic.     Mouth/Throat:     Mouth: No oral lesions.     Pharynx: Uvula midline. No pharyngeal swelling, oropharyngeal exudate, posterior oropharyngeal erythema or uvula swelling.     Tonsils: No tonsillar exudate or tonsillar abscesses.  Cardiovascular:     Rate and Rhythm: Normal rate and regular rhythm.  Musculoskeletal:     Cervical back: Normal range of motion.  Neurological:     Mental Status: She is alert.     Results for orders placed or performed in visit on 08/02/22  POCT rapid strep A  Result Value Ref Range   Rapid Strep A Screen Negative Negative        Assessment & Plan:   Problem List Items Addressed This Visit       Other   Long  COVID - Primary    Informed patient about Post-COVID condition  Informed patients that people can experience a broad range of symptoms (physical and mental) and symptom clusters that develop during or after COVID-19 and continue for 2 months (i.e., three months from the onset of illness) Negative strep test Recommended conservative management with:  Warm salt water gargles  3-4 times daily Drink warm beverages and plenty of fluids Use honey to relieve cough  Suck on ice chips, popsicles, or lozenges  Use a clean humidifier or cool mist vaporizer      Other Visit Diagnoses     Sore throat       Relevant Orders   POCT rapid strep A (Completed)       No orders of the defined types were placed in this encounter.   Return if symptoms worsen or fail to improve.  Alvira Monday, FNP

## 2022-08-02 NOTE — Patient Instructions (Signed)
I appreciate the opportunity to provide care to you today!   Post-COVID condition - Broad range of symptoms (physical and mental) and symptom clusters that develop during or after COVID-19, continue for ?2 months (ie, three months from the onset of illness),    Sore throat Warm salt water gargles  3-4 times daily Drink warm beverages and plenty of fluids Use honey to relieve cough  Suck on ice chips, popsicles, or lozenges  Use a clean humidifier or cool mist vaporizer.   Please continue to a heart-healthy diet and increase your physical activities. Try to exercise for 75mns at least three times a week.    It was a pleasure to see you and I look forward to continuing to work together on your health and well-being. Please do not hesitate to call the office if you need care or have questions about your care.   Have a wonderful day and week. With Gratitude, GAlvira MondayMSN, FNP-BC'

## 2022-08-10 DIAGNOSIS — M2042 Other hammer toe(s) (acquired), left foot: Secondary | ICD-10-CM | POA: Diagnosis not present

## 2022-08-11 ENCOUNTER — Ambulatory Visit (HOSPITAL_COMMUNITY)
Admission: RE | Admit: 2022-08-11 | Discharge: 2022-08-11 | Disposition: A | Discharge status: Home / Self Care | Payer: PPO | Source: Ambulatory Visit | Attending: Internal Medicine | Admitting: Internal Medicine

## 2022-08-11 DIAGNOSIS — Z780 Asymptomatic menopausal state: Secondary | ICD-10-CM | POA: Insufficient documentation

## 2022-08-11 DIAGNOSIS — M810 Age-related osteoporosis without current pathological fracture: Secondary | ICD-10-CM | POA: Diagnosis not present

## 2022-08-11 DIAGNOSIS — M85832 Other specified disorders of bone density and structure, left forearm: Secondary | ICD-10-CM | POA: Diagnosis not present

## 2022-08-13 ENCOUNTER — Telehealth: Payer: Self-pay | Admitting: Internal Medicine

## 2022-08-13 ENCOUNTER — Telehealth: Payer: Self-pay

## 2022-08-13 NOTE — Telephone Encounter (Signed)
it is not possible to quantitatively compare BMD or calculate a significant change value between different facilities or devices. Lumbar spine was excluded due to advanced degenerative changes.  Patient concerned about paragraph above on her Dexa Scan results requesting a call back.

## 2022-08-13 NOTE — Telephone Encounter (Signed)
Patient called in requesting a call back about recent dexa scan .  Patient has questions about results.  Patient is limited on time today due to caring for mom, will try her best to answer

## 2022-08-13 NOTE — Telephone Encounter (Signed)
Patient questions and concerns sent in tele msg to Dr Posey Pronto

## 2022-08-13 NOTE — Telephone Encounter (Signed)
Patient aware.

## 2022-08-16 ENCOUNTER — Ambulatory Visit (INDEPENDENT_AMBULATORY_CARE_PROVIDER_SITE_OTHER): Payer: PPO | Admitting: Gastroenterology

## 2022-08-31 ENCOUNTER — Other Ambulatory Visit: Payer: Self-pay | Admitting: Internal Medicine

## 2022-08-31 DIAGNOSIS — F19982 Other psychoactive substance use, unspecified with psychoactive substance-induced sleep disorder: Secondary | ICD-10-CM

## 2022-09-01 DIAGNOSIS — X58XXXA Exposure to other specified factors, initial encounter: Secondary | ICD-10-CM | POA: Diagnosis not present

## 2022-09-01 DIAGNOSIS — M94262 Chondromalacia, left knee: Secondary | ICD-10-CM | POA: Diagnosis not present

## 2022-09-01 DIAGNOSIS — G8918 Other acute postprocedural pain: Secondary | ICD-10-CM | POA: Diagnosis not present

## 2022-09-01 DIAGNOSIS — S83232A Complex tear of medial meniscus, current injury, left knee, initial encounter: Secondary | ICD-10-CM | POA: Diagnosis not present

## 2022-09-01 DIAGNOSIS — M6752 Plica syndrome, left knee: Secondary | ICD-10-CM | POA: Diagnosis not present

## 2022-09-01 DIAGNOSIS — Y999 Unspecified external cause status: Secondary | ICD-10-CM | POA: Diagnosis not present

## 2022-09-13 ENCOUNTER — Encounter (INDEPENDENT_AMBULATORY_CARE_PROVIDER_SITE_OTHER): Payer: Self-pay | Admitting: *Deleted

## 2022-09-17 ENCOUNTER — Telehealth: Payer: Self-pay | Admitting: Internal Medicine

## 2022-09-17 NOTE — Telephone Encounter (Signed)
Patient called in regard to temp handicap placard . Patient had knee surgery on 9/13. Wants to know if provider can fill out form for placard.  Patient wants a call back in regard

## 2022-09-20 DIAGNOSIS — M6281 Muscle weakness (generalized): Secondary | ICD-10-CM | POA: Insufficient documentation

## 2022-09-20 NOTE — Telephone Encounter (Signed)
This will need to come from surgeon who did knee surgery please let pt know

## 2022-09-21 DIAGNOSIS — M25562 Pain in left knee: Secondary | ICD-10-CM | POA: Diagnosis not present

## 2022-09-21 DIAGNOSIS — M6281 Muscle weakness (generalized): Secondary | ICD-10-CM | POA: Diagnosis not present

## 2022-09-21 DIAGNOSIS — R269 Unspecified abnormalities of gait and mobility: Secondary | ICD-10-CM | POA: Diagnosis not present

## 2022-09-23 ENCOUNTER — Other Ambulatory Visit (INDEPENDENT_AMBULATORY_CARE_PROVIDER_SITE_OTHER): Payer: Self-pay

## 2022-09-23 ENCOUNTER — Ambulatory Visit (HOSPITAL_COMMUNITY): Payer: PPO | Admitting: Physical Therapy

## 2022-09-23 DIAGNOSIS — M6281 Muscle weakness (generalized): Secondary | ICD-10-CM | POA: Diagnosis not present

## 2022-09-23 DIAGNOSIS — R269 Unspecified abnormalities of gait and mobility: Secondary | ICD-10-CM | POA: Diagnosis not present

## 2022-09-23 DIAGNOSIS — B181 Chronic viral hepatitis B without delta-agent: Secondary | ICD-10-CM

## 2022-09-23 DIAGNOSIS — M25562 Pain in left knee: Secondary | ICD-10-CM | POA: Diagnosis not present

## 2022-09-27 DIAGNOSIS — M1712 Unilateral primary osteoarthritis, left knee: Secondary | ICD-10-CM | POA: Diagnosis not present

## 2022-09-27 DIAGNOSIS — R269 Unspecified abnormalities of gait and mobility: Secondary | ICD-10-CM | POA: Diagnosis not present

## 2022-09-27 DIAGNOSIS — M6281 Muscle weakness (generalized): Secondary | ICD-10-CM | POA: Diagnosis not present

## 2022-09-28 ENCOUNTER — Ambulatory Visit (INDEPENDENT_AMBULATORY_CARE_PROVIDER_SITE_OTHER): Payer: PPO | Admitting: Internal Medicine

## 2022-09-28 ENCOUNTER — Encounter: Payer: Self-pay | Admitting: Internal Medicine

## 2022-09-28 VITALS — BP 124/84 | HR 78 | Resp 16 | Ht 61.0 in | Wt 153.6 lb

## 2022-09-28 DIAGNOSIS — Z23 Encounter for immunization: Secondary | ICD-10-CM | POA: Diagnosis not present

## 2022-09-28 DIAGNOSIS — B181 Chronic viral hepatitis B without delta-agent: Secondary | ICD-10-CM | POA: Diagnosis not present

## 2022-09-28 DIAGNOSIS — E782 Mixed hyperlipidemia: Secondary | ICD-10-CM

## 2022-09-28 DIAGNOSIS — Z0001 Encounter for general adult medical examination with abnormal findings: Secondary | ICD-10-CM

## 2022-09-28 DIAGNOSIS — S83232D Complex tear of medial meniscus, current injury, left knee, subsequent encounter: Secondary | ICD-10-CM | POA: Diagnosis not present

## 2022-09-28 DIAGNOSIS — R768 Other specified abnormal immunological findings in serum: Secondary | ICD-10-CM | POA: Diagnosis not present

## 2022-09-28 DIAGNOSIS — M810 Age-related osteoporosis without current pathological fracture: Secondary | ICD-10-CM | POA: Diagnosis not present

## 2022-09-28 DIAGNOSIS — G4700 Insomnia, unspecified: Secondary | ICD-10-CM | POA: Diagnosis not present

## 2022-09-28 MED ORDER — IBANDRONATE SODIUM 150 MG PO TABS: ORAL_TABLET | ORAL | 3 refills | Status: DC

## 2022-09-28 NOTE — Assessment & Plan Note (Signed)
Last DEXA scan reviewed On Boniva now On Calcium and Vitamin D supplement (Caltrate) 

## 2022-09-28 NOTE — Patient Instructions (Signed)
Please continue taking medications as prescribed.  Please take Caltrate 600 plus D3 twice daily.  Please continue to follow low salt diet and ambulate as tolerated.  Please consider getting Tdap vaccine at local pharmacy.

## 2022-09-28 NOTE — Progress Notes (Signed)
Established Patient Office Visit  Subjective:  Patient ID: Alyssa Durham, female    DOB: 01/21/1951  Age: 71 y.o. MRN: [id number]  CC:  Chief Complaint  Patient presents with   Annual Exam    Annual exam had knee surgery 9-13 is doing well    HPI Alyssa Durham is a 71 y.o. female with past medical history of chronic migraine, mild intermittent asthma related to allergies, osteoporosis, chronic hep B, lichen sclerosus, and gluteal tendinitis of right buttock who presents for annual physical.  She had left knee arthroscopy with meniscal repair.  She has started physical therapy now.  She is still complains of left knee pain, but has been improving now.  She takes Tylenol as needed for left knee pain.  Patient follows up with neurology for chronic migraine and takes Relpax and Qulipta for it.  She was on Nurtec in the past.   She has history of osteoporosis, for which she takes [place].  She agrees to start taking Caltrate 600+ D3 now.  She has chronic insomnia, for which she takes Ambien as needed.  Denies any anhedonia, SI or HI currently.   Past Medical History:  Diagnosis Date   Allergy    Anxiety and depression    Arthritis    Asthma    Cataract    Chronic active type B viral hepatitis ([place])    Depression    History of HPV infection 11/15/2017   HLD (hyperlipidemia) 05/12/2017   Localized swelling of left lower leg 01/01/2021   Migraines    Osteoporosis    Primary hypertension 01/01/2021    Past Surgical History:  Procedure Laterality Date   CESAREAN SECTION     x3   LIVER BIOPSY  1993   URETHRAL DILATION     WISDOM TOOTH EXTRACTION      Family History  Problem Relation Age of Onset   Uterine cancer Mother    Migraines Father    Early death Father 42       suicide    Transient ischemic attack Father        multiple    Migraines Brother    Healthy Daughter    Drug abuse Son        addict - stable on suboxone   Hepatitis B Son    Healthy Son     Cancer Maternal Grandmother        breast   Alcohol abuse Paternal Grandfather    Early death Paternal Aunt        suicide   Mental illness Paternal Aunt     Social History   Socioeconomic History   Marital status: Divorced    Spouse name: Not on file   Number of children: 3   Years of education: 82   Highest education level: Not on file  Occupational History   Occupation: retired    Comment: Chiropractor  Tobacco Use   Smoking status: Former    Packs/day: 2.00    Years: 5.00    Total pack years: 10.00    Types: Cigarettes    Start date: 12/21/1967    Quit date: 12/20/1972    Years since quitting: 49.8   Smokeless tobacco: Never  Vaping Use   Vaping Use: Never used  Substance and Sexual Activity   Alcohol use: No   Drug use: No   Sexual activity: Not Currently  Other Topics Concern   Not on file  Social History Narrative   Retired  Web designer   Three children  [place], [place] and [place] 2   Right handed   College   Social Determinants of Health   Financial Resource Strain: Low Risk  (01/14/2022)   Overall Financial Resource Strain (CARDIA)    Difficulty of Paying Living Expenses: Not hard at all  Food Insecurity: No Food Insecurity (01/14/2022)   Hunger Vital Sign    Worried About Running Out of Food in the Last Year: Never true    Ran Out of Food in the Last Year: Never true  Transportation Needs: No Transportation Needs (01/14/2022)   PRAPARE - Hydrologist (Medical): No    Lack of Transportation (Non-Medical): No  Physical Activity: Inactive (01/14/2022)   Exercise Vital Sign    Days of Exercise per Week: 0 days    Minutes of Exercise per Session: 0 min  Stress: Stress Concern Present (01/14/2022)   [place]    Feeling of Stress : To some extent  Social Connections: Moderately Integrated (01/14/2022)   Social Connection and  Isolation Panel [NHANES]    Frequency of Communication with Friends and Family: More than three times a week    Frequency of Social Gatherings with Friends and Family: More than three times a week    Attends Religious Services: More than 4 times per year    Active Member of [employer] or Organizations: Yes    Attends Music therapist: More than 4 times per year    Marital Status: Divorced  Intimate Partner Violence: Not At Risk (01/14/2022)   Humiliation, Afraid, Rape, and Kick questionnaire    Fear of Current or Ex-Partner: No    Emotionally Abused: No    Physically Abused: No    Sexually Abused: No    Outpatient Medications Prior to Visit  Medication Sig Dispense Refill   albuterol (PROAIR HFA) 108 (90 Base) MCG/ACT inhaler Inhale 2 puffs into the lungs every 6 (six) hours as needed for wheezing or shortness of breath. 18 g 5   Atogepant (QULIPTA) 60 MG TABS Take 1 tablet by mouth daily. 30 tablet 11   benzonatate (TESSALON) 100 MG capsule Take 1 capsule (100 mg total) by mouth 2 (two) times daily as needed for cough. 20 capsule 0   cholecalciferol (VITAMIN D3) 25 MCG (1000 UNIT) tablet Take 1,000 Units by mouth daily.     entecavir (BARACLUDE) 0.5 MG tablet TAKE ONE TABLET BY MOUTH ONCE DAILY. 90 tablet 3   magnesium oxide (MAG-OX) 400 MG tablet Take 400 mg by mouth daily.     Multiple Vitamins-Minerals (HAIR/SKIN/NAILS/BIOTIN PO) Take 1 each by mouth in the morning, at noon, and at bedtime.     mupirocin ointment (BACTROBAN) 2 % Apply 1 application. topically 2 (two) times daily. 22 g 0   polyethylene glycol (MIRALAX / GLYCOLAX) 17 g packet Take 4.25 g by mouth daily.     RELPAX 40 MG tablet May repeat in 2 hours if headache persists or recurs. 12 tablet 11   tretinoin (RETIN-A) 0.1 % cream Apply 1 application topically at bedtime as needed. Patient states that she uses sporadic     zolpidem (AMBIEN) 5 MG tablet TAKE ONE TABLET BY MOUTH AT BEDTIME AS NEEDED FOR SLEEP. 30 tablet  1   BARACLUDE 0.5 MG tablet TAKE 1 TABLET(0.5 MG) BY MOUTH DAILY 90 tablet 3   ibandronate (BONIVA) 150 MG tablet TAKE (1) TABLET ONCE  A MONTH ONLY. TAKE WITH 6 TO 8 OZ OF WATER AND DO NOT LAY DOWN FOR 1 HOUR. 3 tablet 0   No facility-administered medications prior to visit.    Allergies  Allergen Reactions   Codeine Nausea And Vomiting and Other (See Comments)    ROS Review of Systems  Constitutional:  Negative for chills and fever.  HENT:  Negative for congestion, postnasal drip, rhinorrhea, sinus pressure, sinus pain and sore throat.   Eyes:  Negative for pain and discharge.  Respiratory:  Negative for cough and shortness of breath.   Cardiovascular:  Negative for chest pain and palpitations.  Gastrointestinal:  Negative for abdominal pain, diarrhea, nausea and vomiting.  Endocrine: Negative for polydipsia and polyuria.  Genitourinary:  Negative for dysuria and hematuria.  Musculoskeletal:  Positive for arthralgias, back pain and joint swelling. Negative for neck pain and neck stiffness.  Skin:  Negative for rash.  Neurological:  Negative for dizziness, seizures, syncope and weakness.  Psychiatric/Behavioral:  Negative for agitation and behavioral problems.       Objective:    Physical Exam Vitals reviewed.  Constitutional:      General: She is not in acute distress.    Appearance: She is not diaphoretic.  HENT:     Head: Normocephalic and atraumatic.     Nose: Nose normal. No congestion.     Mouth/Throat:     Mouth: Mucous membranes are moist.     Pharynx: No posterior oropharyngeal erythema.  Eyes:     General: No scleral icterus.    Extraocular Movements: Extraocular movements intact.  Neck:     Vascular: No carotid bruit.  Cardiovascular:     Rate and Rhythm: Normal rate and regular rhythm.     Pulses: Normal pulses.     Heart sounds: Normal heart sounds. No murmur heard. Pulmonary:     Breath sounds: Normal breath sounds. No wheezing or rales.  Abdominal:      Palpations: Abdomen is soft.     Tenderness: There is no abdominal tenderness.  Musculoskeletal:     Right hand: Swelling (Mild PIP and DIP joints) present.     Left hand: Swelling (Mild PIP and DIP joints) present.     Cervical back: Neck supple. No tenderness.     Left knee: Swelling (Mild) present. No effusion or erythema. Decreased range of motion.     Left lower leg: Swelling present.  Skin:    General: Skin is warm.     Findings: No rash.  Neurological:     General: No focal deficit present.     Mental Status: She is alert and oriented to person, place, and time.     Cranial Nerves: No cranial nerve deficit.     Sensory: No sensory deficit.     Motor: No weakness.  Psychiatric:        Mood and Affect: Mood normal.        Behavior: Behavior normal.     BP 124/84 (BP Location: Left Arm, Patient Position: Sitting, Cuff Size: Normal)   Pulse 78   Resp 16   Ht 5' 1"  (1.549 m)   Wt 153 lb 9.6 oz (69.7 kg)   SpO2 97%   BMI 29.02 kg/m  Wt Readings from Last 3 Encounters:  09/28/22 153 lb 9.6 oz (69.7 kg)  08/02/22 152 lb 1.3 oz (69 kg)  05/07/22 163 lb (73.9 kg)    Lab Results  Component Value Date   TSH 1.760 03/17/2022  Lab Results  Component Value Date   WBC 5.9 06/15/2022   HGB 15.4 06/15/2022   HCT 44.3 06/15/2022   MCV 90.6 06/15/2022   PLT 289 06/15/2022   Lab Results  Component Value Date   NA 139 06/15/2022   K 4.4 06/15/2022   CO2 26 06/15/2022   GLUCOSE 89 06/15/2022   BUN 24 06/15/2022   CREATININE 0.97 06/15/2022   BILITOT 0.5 06/15/2022   ALKPHOS 70 03/17/2022   AST 23 06/15/2022   ALT 18 06/15/2022   PROT 6.3 06/15/2022   ALBUMIN 4.5 03/17/2022   CALCIUM 9.5 06/15/2022   EGFR 62 03/17/2022   Lab Results  Component Value Date   CHOL 214 (H) 03/17/2022   Lab Results  Component Value Date   HDL 73 03/17/2022   Lab Results  Component Value Date   LDLCALC 122 (H) 03/17/2022   Lab Results  Component Value Date   TRIG 110  03/17/2022   Lab Results  Component Value Date   CHOLHDL 2.9 03/17/2022   Lab Results  Component Value Date   HGBA1C 5.1 03/17/2022      Assessment & Plan:   Problem List Items Addressed This Visit       Digestive   Chronic hepatitis B ([place])    On Baraclude Followed by GI      Relevant Orders   CMP14+EGFR   CBC with Differential/Platelet     Musculoskeletal and Integument   Osteoporosis    Last DEXA scan reviewed On Boniva now On Calcium and Vitamin D supplement (Caltrate)      Relevant Medications   ibandronate (BONIVA) 150 MG tablet   Tear of medial meniscus of knee    S/p left knee arthroscopy and meniscal repair Followed by orth[facility] at [facility] Continue PT        Other   Hyperlipidemia   Relevant Orders   Lipid panel   Elevated rheumatoid factor    Reports history of elevated RF in the past Check RF, had an appointment with Rheumatologist      Relevant Orders   Rheumatoid Factor   Encounter for general adult medical examination with abnormal findings - Primary    Physical exam as documented. Counseling done  re healthy lifestyle involving commitment to 150 minutes exercise per week, heart healthy diet, and attaining healthy weight.The importance of adequate sleep also discussed. Changes in health habits are decided on by the patient with goals and time frames  set for achieving them. Immunization and cancer screening needs are specifically addressed at this visit.      Relevant Orders   CMP14+EGFR   CBC with Differential/Platelet   Insomnia    Takes Ambien 5 mg nightly as needed      Other Visit Diagnoses     Need for immunization against influenza       Relevant Orders   Flu Vaccine QUAD High Dose(Fluad) (Completed)       Meds ordered this encounter  Medications   ibandronate (BONIVA) 150 MG tablet    Sig: TAKE (1) TABLET ONCE A MONTH ONLY. TAKE WITH 6 TO 8 OZ OF WATER AND DO NOT LAY DOWN FOR 1 HOUR.    Dispense:  3  tablet    Refill:  3    Follow-up: Return in about 4 months (around 01/29/2023) for HTN and insomnia.    Lindell Spar, MD

## 2022-09-28 NOTE — Assessment & Plan Note (Signed)
Takes Ambien 5 mg nightly as needed 

## 2022-09-28 NOTE — Assessment & Plan Note (Signed)
S/p left knee arthroscopy and meniscal repair Followed by orthopedic surgeon at [facility]

## 2022-09-28 NOTE — Assessment & Plan Note (Signed)
Reports history of elevated RF in the past Check RF, had an appointment with Rheumatologist

## 2022-09-28 NOTE — Assessment & Plan Note (Signed)

## 2022-09-28 NOTE — Assessment & Plan Note (Signed)
On Baraclude Followed by GI 

## 2022-09-29 DIAGNOSIS — M6281 Muscle weakness (generalized): Secondary | ICD-10-CM | POA: Diagnosis not present

## 2022-09-29 DIAGNOSIS — R269 Unspecified abnormalities of gait and mobility: Secondary | ICD-10-CM | POA: Diagnosis not present

## 2022-09-29 DIAGNOSIS — M1712 Unilateral primary osteoarthritis, left knee: Secondary | ICD-10-CM | POA: Diagnosis not present

## 2022-10-01 DIAGNOSIS — R768 Other specified abnormal immunological findings in serum: Secondary | ICD-10-CM | POA: Diagnosis not present

## 2022-10-01 DIAGNOSIS — E782 Mixed hyperlipidemia: Secondary | ICD-10-CM | POA: Diagnosis not present

## 2022-10-01 DIAGNOSIS — B181 Chronic viral hepatitis B without delta-agent: Secondary | ICD-10-CM | POA: Diagnosis not present

## 2022-10-01 DIAGNOSIS — Z0001 Encounter for general adult medical examination with abnormal findings: Secondary | ICD-10-CM | POA: Diagnosis not present

## 2022-10-03 LAB — LIPID PANEL
Chol/HDL Ratio: 2.9 ratio (ref 0.0–4.4)
Cholesterol, Total: 225 mg/dL — ABNORMAL HIGH (ref 100–199)
HDL: 77 mg/dL (ref >39)
LDL Chol Calc (NIH): 129 mg/dL — ABNORMAL HIGH (ref 0–99)
Triglycerides: 110 mg/dL (ref 0–149)
VLDL Cholesterol Cal: 19 mg/dL (ref 5–40)

## 2022-10-03 LAB — CBC WITH DIFFERENTIAL/PLATELET
Basophils Absolute: 0 10*3/uL (ref 0.0–0.2)
Basos: 1 %
EOS (ABSOLUTE): 0.1 10*3/uL (ref 0.0–0.4)
Eos: 2 %
Hematocrit: 48.1 % — ABNORMAL HIGH (ref 34.0–46.6)
Hemoglobin: 15.8 g/dL (ref 11.1–15.9)
Immature Grans (Abs): 0 10*3/uL (ref 0.0–0.1)
Immature Granulocytes: 0 %
Lymphocytes Absolute: 1 10*3/uL (ref 0.7–3.1)
Lymphs: 20 %
MCH: 30.3 pg (ref 26.6–33.0)
MCHC: 32.8 g/dL (ref 31.5–35.7)
MCV: 92 fL (ref 79–97)
Monocytes Absolute: 0.5 10*3/uL (ref 0.1–0.9)
Monocytes: 9 %
Neutrophils Absolute: 3.6 10*3/uL (ref 1.4–7.0)
Neutrophils: 68 %
Platelets: 341 10*3/uL (ref 150–450)
RBC: 5.22 x10E6/uL (ref 3.77–5.28)
RDW: 12.5 % (ref 11.7–15.4)
WBC: 5.2 10*3/uL (ref 3.4–10.8)

## 2022-10-03 LAB — CMP14+EGFR
ALT: 13 IU/L (ref 0–32)
AST: 23 IU/L (ref 0–40)
Albumin/Globulin Ratio: 1.9 (ref 1.2–2.2)
Albumin: 4.5 g/dL (ref 3.8–4.8)
Alkaline Phosphatase: 75 IU/L (ref 44–121)
BUN/Creatinine Ratio: 24 (ref 12–28)
BUN: 18 mg/dL (ref 8–27)
Bilirubin Total: 0.5 mg/dL (ref 0.0–1.2)
CO2: 24 mmol/L (ref 20–29)
Calcium: 9.3 mg/dL (ref 8.7–10.3)
Chloride: 104 mmol/L (ref 96–106)
Creatinine, Ser: 0.74 mg/dL (ref 0.57–1.00)
Globulin, Total: 2.4 g/dL (ref 1.5–4.5)
Glucose: 98 mg/dL (ref 70–99)
Potassium: 4.4 mmol/L (ref 3.5–5.2)
Sodium: 142 mmol/L (ref 134–144)
Total Protein: 6.9 g/dL (ref 6.0–8.5)
eGFR: 86 mL/min/{1.73_m2} (ref >59)

## 2022-10-03 LAB — RHEUMATOID FACTOR: Rheumatoid fact SerPl-aCnc: 303.1 IU/mL — ABNORMAL HIGH (ref <14.0)

## 2022-10-04 ENCOUNTER — Ambulatory Visit: Payer: PPO | Admitting: Neurology

## 2022-10-04 ENCOUNTER — Encounter: Payer: Self-pay | Admitting: Neurology

## 2022-10-04 VITALS — BP 120/62 | HR 67 | Ht 61.0 in | Wt 155.4 lb

## 2022-10-04 DIAGNOSIS — G43709 Chronic migraine without aura, not intractable, without status migrainosus: Secondary | ICD-10-CM

## 2022-10-04 MED ORDER — QULIPTA 60 MG PO TABS: 1.0000 | ORAL_TABLET | Freq: Every day | ORAL | 12 refills | Status: DC

## 2022-10-04 MED ORDER — RELPAX 40 MG PO TABS: ORAL_TABLET | ORAL | 11 refills | Status: DC

## 2022-10-04 NOTE — Progress Notes (Signed)
NEUROLOGY FOLLOW UP OFFICE NOTE  Alyssa Durham [id number] 1951-02-22  HISTORY OF PRESENT ILLNESS: I had the pleasure of seeing Alyssa Durham in follow-up in the neurology clinic on 10/04/2022.  The patient was last seen 10 months ago for chronic migraine. She had an excellent response to [place] '50mg'$  daily, taking 1/2 tablet twice a day since March 2022. She reports it is still a miracle for her. She has a migraine in the early morning hours every 1-2 weeks and cuts her Relpax with good effect. She has some constipation with the Qulipta but sprinkles Miralax every morning and that takes care of it. She denies any dizziness, vision changes, no recent falls. She had surgery on her left knee 5 weeks ago and has had a difficult recovery.    History on Initial Assessment 08/16/2016: This is a 71 yo RH woman with a history of chronic migraines, depression. She moved from [place] 2 months ago, records from her previous neurologist were reviewed. She has had migraines since 1969. Migraines now are mostly localized over the right hemisphere, if she closes her eyes, she sees flashing lights, but no prior aura. This is associated with nausea, vomiting, photo and phonophobia. She has 4-5 migraine days a week, taking 1/2 to 2 tablets of Relpax at the onset, which does help ease progression of symptoms. For the past 4 years, she has had migraines upon awakening. Migraines were fairly well-controlled until 1993 when she was started on a trial of an unrecalled antidepressant which helped. In December 2016, headaches worsened and she was started on Lexapro, which helped with headaches but caused weight gain and hair loss. She tried Celexa (weight gain), Zoloft (weight gain), Wellbutrin. She also tried Topamax but stopped due to risk for kidney stones. She recalls taking Inderal but cannot recall effects/side effects. Most recently, she was tried on nortriptyline, which did not help with the headaches, but helped her mood.  She is now on Cymbalta for arthritis, which also helps with her mood. For rescue, she has tried sumatriptan, Maxalt, and Zomig, with no effect. Relpax and Frova helped the most, causing less palpitations. She has noticed stress to be a big trigger, as well a chocolates. She usually sleeps good. There is a family history of migraines in her father, brother, and niece. She had received 2 rounds of Botox injections for chronic migraine, the first one helped very well with much less use of Relpax. Last Botox was in April 2017 before her move.    She is also reporting memory loss. She had told her neurologist about this in 1994. Memory issues are more for long-term memory, she cannot recall what happened years ago with her family, which concerns her because she cannot recall her children growing up. Short-term memory is pretty good, she denies getting lost driving, no missed medications or bill payments. She denies any dizziness, diplopia, dysarthria, dysphagia, neck pain, bowel/bladder dysfunction. She has floaters in her left eye.    Diagnostic Data: MRI brain without contrast done 04/17/15 at [facility] in [place] did not show any acute changes, there was subtle white matter hyperintensity in the posterior left occipital lobe. Compared to 2007 study, this is only minimally progressive. EEG done 04/18/15 in [place] was within normal limits. She had a liver MRI with incidental finding of syrinx on the thoracic cord. 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  No underlying mass, mass effect, or cord thinning. She is asymptomatic from this.   Prior Preventative Medications: Zoloft, Topamax, Inderal, nortriptyline, Cymbalta, Botox, Zonisamide, Aimovig Prior Rescue medications: Relpax, sumatriptan, maxalt, Zomig, Frova, Amerge (did help), Ubrelvy, Nurtec Has not tried: gabapentin,  depakote  PAST MEDICAL HISTORY: Past Medical History:  Diagnosis Date   Allergy    Anxiety and depression    Arthritis    Asthma    Cataract    Chronic active type B viral hepatitis ([place])    Depression    History of HPV infection 11/15/2017   HLD (hyperlipidemia) 05/12/2017   Localized swelling of left lower leg 01/01/2021   Migraines    Osteoporosis    Primary hypertension 01/01/2021    MEDICATIONS: Current Outpatient Medications on File Prior to Visit  Medication Sig Dispense Refill   albuterol (PROAIR HFA) 108 (90 Base) MCG/ACT inhaler Inhale 2 puffs into the lungs every 6 (six) hours as needed for wheezing or shortness of breath. 18 g 5   Atogepant (QULIPTA) 60 MG TABS Take 1 tablet by mouth daily. 30 tablet 11   benzonatate (TESSALON) 100 MG capsule Take 1 capsule (100 mg total) by mouth 2 (two) times daily as needed for cough. 20 capsule 0   cholecalciferol (VITAMIN D3) 25 MCG (1000 UNIT) tablet Take 1,000 Units by mouth daily.     entecavir (BARACLUDE) 0.5 MG tablet TAKE ONE TABLET BY MOUTH ONCE DAILY. 90 tablet 3   ibandronate (BONIVA) 150 MG tablet TAKE (1) TABLET ONCE A MONTH ONLY. TAKE WITH 6 TO 8 OZ OF WATER AND DO NOT LAY DOWN FOR 1 HOUR. 3 tablet 3   magnesium oxide (MAG-OX) 400 MG tablet Take 400 mg by mouth daily.     Multiple Vitamins-Minerals (HAIR/SKIN/NAILS/BIOTIN PO) Take 1 each by mouth in the morning, at noon, and at bedtime.     mupirocin ointment (BACTROBAN) 2 % Apply 1 application. topically 2 (two) times daily. 22 g 0   polyethylene glycol (MIRALAX / GLYCOLAX) 17 g packet Take 4.25 g by mouth daily.     RELPAX 40 MG tablet May repeat in 2 hours if headache persists or recurs. 12 tablet 11   tretinoin (RETIN-A) 0.1 % cream Apply 1 application topically at bedtime as needed. Patient states that she uses sporadic     zolpidem (AMBIEN) 5 MG tablet TAKE ONE TABLET BY MOUTH AT BEDTIME AS NEEDED FOR SLEEP. 30 tablet 1   No current facility-administered  medications on file prior to visit.    ALLERGIES: Allergies  Allergen Reactions   Codeine Nausea And Vomiting and Other (See Comments)    FAMILY HISTORY: Family History  Problem Relation Age of Onset   Uterine cancer Mother    Migraines Father    Early death Father 18       suicide    Transient ischemic attack Father        multiple    Migraines Brother    Healthy Daughter    Drug abuse Son        addict - stable on suboxone   Hepatitis B Son    Healthy Son    Cancer Maternal Grandmother        breast   Alcohol abuse Paternal Grandfather    Early death Paternal Aunt        suicide   Mental illness Paternal Aunt     SOCIAL HISTORY: Social History   Socioeconomic History   Marital status: Divorced  Spouse name: Not on file   Number of children: 3   Years of education: 14   Highest education level: Not on file  Occupational History   Occupation: retired    Comment: Chiropractor  Tobacco Use   Smoking status: Former    Packs/day: 2.00    Years: 5.00    Total pack years: 10.00    Types: Cigarettes    Start date: 12/21/1967    Quit date: 12/20/1972    Years since quitting: 49.8   Smokeless tobacco: Never  Vaping Use   Vaping Use: Never used  Substance and Sexual Activity   Alcohol use: No   Drug use: No   Sexual activity: Not Currently  Other Topics Concern   Not on file  Social History Narrative   Retired Web designer   Three children  [place], [place] and [employer] - 2   Right handed   College   Social Determinants of Health   Financial Resource Strain: Low Risk  (01/14/2022)   Overall Financial Resource Strain (CARDIA)    Difficulty of Paying Living Expenses: Not hard at all  Food Insecurity: No Food Insecurity (01/14/2022)   Hunger Vital Sign    Worried About Running Out of Food in the Last Year: Never true    [place] in the Last Year: Never true  Transportation Needs: No Transportation Needs (01/14/2022)    PRAPARE - Hydrologist (Medical): No    Lack of Transportation (Non-Medical): No  Physical Activity: Inactive (01/14/2022)   Exercise Vital Sign    Days of Exercise per Week: 0 days    Minutes of Exercise per Session: 0 min  Stress: Stress Concern Present (01/14/2022)   [place]    Feeling of Stress : To some extent  Social Connections: Moderately Integrated (01/14/2022)   Social Connection and Isolation Panel [NHANES]    Frequency of Communication with Friends and Family: More than three times a week    Frequency of Social Gatherings with Friends and Family: More than three times a week    Attends Religious Services: More than 4 times per year    Active Member of [employer] or Organizations: Yes    Attends Music therapist: More than 4 times per year    Marital Status: Divorced  Intimate Partner Violence: Not At Risk (01/14/2022)   Humiliation, Afraid, Rape, and Kick questionnaire    Fear of Current or Ex-Partner: No    Emotionally Abused: No    Physically Abused: No    Sexually Abused: No     PHYSICAL EXAM: Vitals:   10/04/22 1503  BP: 120/62  Pulse: 67  SpO2: 97%   General: No acute distress Head:  Normocephalic/atraumatic Skin/Extremities: No rash, no edema Neurological Exam: alert and awake. No aphasia or dysarthria. Fund of knowledge is appropriate.  Attention and concentration are normal.   Cranial nerves: Pupils equal, round. Extraocular movements intact with no nystagmus. Visual fields full.  No facial asymmetry.  Motor: Bulk and tone normal, muscle strength 5/5 throughout with no pronator drift.   Finger to nose testing intact.  Gait slow and cautious due to left knee pain. No ataxia   IMPRESSION: This is a 71 yo RH woman with chronic migraines with significant improvement on Qulipta '60mg'$  daily. She has prn Relpax for migraine rescue. She continues to do well on this  regimen, refills sent. Follow-up in  1 year, call for any changes.    Thank you for allowing me to participate in her care.  Please do not hesitate to call for any questions or concerns.    Ellouise Newer, M.D.   CC: Dr. Posey Pronto

## 2022-10-04 NOTE — Patient Instructions (Signed)
Good to see you doing well with the migraines. Continue Qulipta and as needed Relpax. Wishing you a speedier recovery with your knee! Follow-up in 1 year, call for any changes.

## 2022-10-05 DIAGNOSIS — R269 Unspecified abnormalities of gait and mobility: Secondary | ICD-10-CM | POA: Diagnosis not present

## 2022-10-05 DIAGNOSIS — M25562 Pain in left knee: Secondary | ICD-10-CM | POA: Diagnosis not present

## 2022-10-05 DIAGNOSIS — M6281 Muscle weakness (generalized): Secondary | ICD-10-CM | POA: Diagnosis not present

## 2022-10-07 DIAGNOSIS — M25562 Pain in left knee: Secondary | ICD-10-CM | POA: Diagnosis not present

## 2022-10-07 DIAGNOSIS — M6281 Muscle weakness (generalized): Secondary | ICD-10-CM | POA: Diagnosis not present

## 2022-10-07 DIAGNOSIS — R269 Unspecified abnormalities of gait and mobility: Secondary | ICD-10-CM | POA: Diagnosis not present

## 2022-10-11 DIAGNOSIS — M25562 Pain in left knee: Secondary | ICD-10-CM | POA: Diagnosis not present

## 2022-10-11 DIAGNOSIS — R269 Unspecified abnormalities of gait and mobility: Secondary | ICD-10-CM | POA: Diagnosis not present

## 2022-10-11 DIAGNOSIS — M6281 Muscle weakness (generalized): Secondary | ICD-10-CM | POA: Diagnosis not present

## 2022-10-13 DIAGNOSIS — M25562 Pain in left knee: Secondary | ICD-10-CM | POA: Diagnosis not present

## 2022-10-13 DIAGNOSIS — M6281 Muscle weakness (generalized): Secondary | ICD-10-CM | POA: Diagnosis not present

## 2022-10-13 DIAGNOSIS — R269 Unspecified abnormalities of gait and mobility: Secondary | ICD-10-CM | POA: Diagnosis not present

## 2022-10-14 ENCOUNTER — Ambulatory Visit (HOSPITAL_COMMUNITY): Payer: PPO

## 2022-10-21 ENCOUNTER — Ambulatory Visit (HOSPITAL_COMMUNITY)
Admission: RE | Admit: 2022-10-21 | Discharge: 2022-10-21 | Disposition: A | Discharge status: Home / Self Care | Payer: PPO | Source: Ambulatory Visit | Attending: Gastroenterology | Admitting: Gastroenterology

## 2022-10-21 DIAGNOSIS — B181 Chronic viral hepatitis B without delta-agent: Secondary | ICD-10-CM | POA: Insufficient documentation

## 2022-10-21 DIAGNOSIS — K7689 Other specified diseases of liver: Secondary | ICD-10-CM | POA: Diagnosis not present

## 2022-10-26 ENCOUNTER — Encounter (INDEPENDENT_AMBULATORY_CARE_PROVIDER_SITE_OTHER): Payer: Self-pay | Admitting: Gastroenterology

## 2022-10-26 ENCOUNTER — Ambulatory Visit (INDEPENDENT_AMBULATORY_CARE_PROVIDER_SITE_OTHER): Payer: PPO | Admitting: Gastroenterology

## 2022-10-26 VITALS — BP 134/82 | HR 72 | Ht 61.0 in | Wt 156.4 lb

## 2022-10-26 DIAGNOSIS — B181 Chronic viral hepatitis B without delta-agent: Secondary | ICD-10-CM

## 2022-10-26 NOTE — Progress Notes (Unsigned)
Referring Provider: Lindell Spar, MD Primary Care Physician:  Lindell Spar, MD Primary GI Physician: Previously Dr. Laural Golden  Chief Complaint  Patient presents with   Hepatitis B    6 month follow up on hepatitis B. Would like to discuss ultrasound results.    HPI:   Alyssa Durham is a 71 y.o. female with past medical history of anxiety, depression, arthritis, asthma, HLD, Osteoporosis. HTN. Chronic hep b  Patient presenting today for follow up of hepatitis B  She has history of hepatitis B dating back to 1993.  She failed interferon therapy. Liver biopsy in April last year revealed moderately active chronic hepatitis with advanced fibrosis. She was felt to have stage III-IV disease.  Therefore antiviral therapy was recommended and she was begun on Entecavir on 04/14/2021. She responded to antiviral therapy and HBV DNA by PCR have been negative.   At last visit in February 2023, at that time, advised to continue Entecavir for atleast 1 year if seroconversion happens, if not potentially remain on therapy x5 years and have [place] screening every 6 months.   She had US liver elastography 11/2 with median kPa 4.4, Nodular hepatic contour questioned, concerning for cirrhosis. (kPa in March 2023 3.7 .Previous in 2021 with median kpa 5.9).  Hep B testing showed adequate suppression of virus with DNA quant undetectable, Hep BsAg remains reactive LFTs in October WNL, plt count 341    Present: Patient states she is doing well today. She had a recent knee surgery she is having a rough time with that. She is concerned about when to take the medication as she has trouble avoiding taking it within 2 hours of eating. She also notes recent covid and was treated with paxlovid. No red flag symptoms. Patient denies melena, hematochezia, nausea, vomiting, diarrhea, constipation, dysphagia, odyonophagia, early satiety or weight loss.   Last Colonoscopy:many years ago in her 16s, cologuard in 2021 was  negative  Last Endoscopy:never  Recommendations:    Past Medical History:  Diagnosis Date   Allergy    Anxiety and depression    Arthritis    Asthma    Cataract    Chronic active type B viral hepatitis ([place])    Depression    History of HPV infection 11/15/2017   HLD (hyperlipidemia) 05/12/2017   Localized swelling of left lower leg 01/01/2021   Migraines    Osteoporosis    Primary hypertension 01/01/2021    Past Surgical History:  Procedure Laterality Date   CESAREAN SECTION     x3   KNEE SURGERY  2023   LIVER BIOPSY  1993   URETHRAL DILATION     WISDOM TOOTH EXTRACTION      Current Outpatient Medications  Medication Sig Dispense Refill   albuterol (PROAIR HFA) 108 (90 Base) MCG/ACT inhaler Inhale 2 puffs into the lungs every 6 (six) hours as needed for wheezing or shortness of breath. 18 g 5   Atogepant (QULIPTA) 60 MG TABS Take 1 tablet by mouth daily. 30 tablet 12   benzonatate (TESSALON) 100 MG capsule Take 1 capsule (100 mg total) by mouth 2 (two) times daily as needed for cough. 20 capsule 0   cholecalciferol (VITAMIN D3) 25 MCG (1000 UNIT) tablet Take 1,000 Units by mouth daily.     entecavir (BARACLUDE) 0.5 MG tablet TAKE ONE TABLET BY MOUTH ONCE DAILY. 90 tablet 3   ibandronate (BONIVA) 150 MG tablet TAKE (1) TABLET ONCE A MONTH ONLY. TAKE WITH 6 TO 8  OZ OF WATER AND DO NOT LAY DOWN FOR 1 HOUR. 3 tablet 3   magnesium oxide (MAG-OX) 400 MG tablet Take 400 mg by mouth daily.     Multiple Vitamins-Minerals (HAIR/SKIN/NAILS/BIOTIN PO) Take 1 each by mouth in the morning, at noon, and at bedtime.     mupirocin ointment (BACTROBAN) 2 % Apply 1 application. topically 2 (two) times daily. 22 g 0   polyethylene glycol (MIRALAX / GLYCOLAX) 17 g packet Take 4.25 g by mouth daily.     RELPAX 40 MG tablet May repeat in 2 hours if headache persists or recurs. 12 tablet 11   tretinoin (RETIN-A) 0.1 % cream Apply 1 application topically at bedtime as needed. Patient states that  she uses sporadic     zolpidem (AMBIEN) 5 MG tablet TAKE ONE TABLET BY MOUTH AT BEDTIME AS NEEDED FOR SLEEP. 30 tablet 1   No current facility-administered medications for this visit.    Allergies as of 10/26/2022 - Review Complete 10/26/2022  Allergen Reaction Noted   Codeine Nausea And Vomiting and Other (See Comments) 08/16/2016    Family History  Problem Relation Age of Onset   Uterine cancer Mother    Migraines Father    Early death Father 40       suicide    Transient ischemic attack Father        multiple    Migraines Brother    Healthy Daughter    Drug abuse Son        addict - stable on suboxone   Hepatitis B Son    Healthy Son    Cancer Maternal Grandmother        breast   Alcohol abuse Paternal Grandfather    Early death Paternal Aunt        suicide   Mental illness Paternal Aunt     Social History   Socioeconomic History   Marital status: Divorced    Spouse name: Not on file   Number of children: 3   Years of education: 17   Highest education level: Not on file  Occupational History   Occupation: retired    Comment: Chiropractor  Tobacco Use   Smoking status: Former    Packs/day: 2.00    Years: 5.00    Total pack years: 10.00    Types: Cigarettes    Start date: 12/21/1967    Quit date: 12/20/1972    Years since quitting: 49.8    Passive exposure: Past   Smokeless tobacco: Never  Vaping Use   Vaping Use: Never used  Substance and Sexual Activity   Alcohol use: No   Drug use: No   Sexual activity: Not Currently  Other Topics Concern   Not on file  Social History Narrative   Retired Web designer   Three children  [place], [place] and [employer] - 2   Right handed   College   Social Determinants of Health   Financial Resource Strain: Low Risk  (01/14/2022)   Overall Financial Resource Strain (CARDIA)    Difficulty of Paying Living Expenses: Not hard at all  Food Insecurity: No Food Insecurity (01/14/2022)    Hunger Vital Sign    Worried About Running Out of Food in the Last Year: Never true    [place] in the Last Year: Never true  Transportation Needs: No Transportation Needs (01/14/2022)   PRAPARE - Transportation    Lack of Transportation (Medical): No    Lack of  Transportation (Non-Medical): No  Physical Activity: Inactive (01/14/2022)   Exercise Vital Sign    Days of Exercise per Week: 0 days    Minutes of Exercise per Session: 0 min  Stress: Stress Concern Present (01/14/2022)   [place]    Feeling of Stress : To some extent  Social Connections: Moderately Integrated (01/14/2022)   Social Connection and Isolation Panel [NHANES]    Frequency of Communication with Friends and Family: More than three times a week    Frequency of Social Gatherings with Friends and Family: More than three times a week    Attends Religious Services: More than 4 times per year    Active Member of [employer] or Organizations: Yes    Attends Music therapist: More than 4 times per year    Marital Status: Divorced   Review of systems General: negative for malaise, night sweats, fever, chills, weight loss Neck: Negative for lumps, goiter, pain and significant neck swelling Resp: Negative for cough, wheezing, dyspnea at rest CV: Negative for chest pain, leg swelling, palpitations, orthopnea GI: denies melena, hematochezia, nausea, vomiting, diarrhea, constipation, dysphagia, odyonophagia, early satiety or unintentional weight loss.  MSK: Negative for joint pain or swelling, back pain, and muscle pain. Derm: Negative for itching or rash Psych: Denies depression, anxiety, memory loss, confusion. No homicidal or suicidal ideation.  Heme: Negative for prolonged bleeding, bruising easily, and swollen nodes. Endocrine: Negative for cold or heat intolerance, polyuria, polydipsia and goiter. Neuro: negative for tremor, gait imbalance,  syncope and seizures. The remainder of the review of systems is noncontributory.  Physical Exam: BP 134/82 (BP Location: Left Arm, Patient Position: Sitting, Cuff Size: Normal)   Pulse 72   Ht '5\' 1"'$  (1.549 m)   Wt 156 lb 6.4 oz (70.9 kg)   BMI 29.55 kg/m  General:   Alert and oriented. No distress noted. Pleasant and cooperative.  Head:  Normocephalic and atraumatic. Eyes:  Conjuctiva clear without scleral icterus. Mouth:  Oral mucosa pink and moist. Good dentition. No lesions. Heart: Normal rate and rhythm, s1 and s2 heart sounds present.  Lungs: Clear lung sounds in all lobes. Respirations equal and unlabored. Abdomen:  +BS, soft, non-tender and non-distended. No rebound or guarding. No HSM or masses noted. Derm: No palmar erythema or jaundice Msk:  Symmetrical without gross deformities. Normal posture. Extremities:  Without edema. Neurologic:  Alert and  oriented x4 Psych:  Alert and cooperative. Normal mood and affect.  Invalid input(s): "6 MONTHS"   ASSESSMENT: Alyssa Durham is a 71 y.o. female presenting today for follow up of chronic Hepatitis B.  She has history of hepatitis B dating back to 35.  She failed interferon therapy. Liver biopsy in April 2022 revealed moderately active chronic hepatitis with advanced fibrosis. She was felt to have stage III-IV disease.  Therefore antiviral therapy was recommended and she was begun on Entecavir on 04/14/2021. She responded to antiviral therapy and HBV DNA by PCR has been negative.  Most recent LFTs in October were WNL. Elastography earlier this month with kPa 4.4, down from 5.9 in 2021 and Fib-4 score 1.37 which is reassuring for possible regression of fibrosis. Most recent Hep B serologies with DNA quant undetectable and HepBsAg remaining reactive in [place] 2023. Will continue with Enteacvir at this time. Plan for repeat Hep B serologies in December and Liver US for [facility] screening in May 2024.    PLAN:  Hep Bs Ag and Hep  B DNA  QUant  in dec 2023 2. US liver May 2024  3. Continue entecavir  All questions were answered, patient verbalized understanding and is in agreement with plan as outlined above.   Follow Up: 6 months   Brynlei Klausner L. Alver Sorrow, MSN, APRN, AGNP-C Adult-Gerontology Nurse Practitioner [facility]  I have reviewed the note and agree with the APP's assessment as described in this progress note  Patient will need CMP, CBC, viral load and hep Bs Ag every 6 months. Will repeat elastography a year from last. Notably has some inversion of aminotransferases, has high risk for cirrhotic conversion.  Maylon Peppers, MD Gastroenterology and Hepatology [facility] Gastroenterology

## 2022-10-26 NOTE — Patient Instructions (Addendum)
Recent US elastography indicates less fibrosis than prior in 2021 which is good Most recent Hep B testing in [place] indicates virus is suppressed We will recheck Hep B labs in December, plan to repeat US of the liver in May Will continue with Entecavir at current dosage   Follow up 6 months

## 2022-10-27 DIAGNOSIS — M6281 Muscle weakness (generalized): Secondary | ICD-10-CM | POA: Diagnosis not present

## 2022-10-27 DIAGNOSIS — R269 Unspecified abnormalities of gait and mobility: Secondary | ICD-10-CM | POA: Diagnosis not present

## 2022-10-27 DIAGNOSIS — M25562 Pain in left knee: Secondary | ICD-10-CM | POA: Diagnosis not present

## 2022-11-24 DIAGNOSIS — R269 Unspecified abnormalities of gait and mobility: Secondary | ICD-10-CM | POA: Diagnosis not present

## 2022-11-24 DIAGNOSIS — M6281 Muscle weakness (generalized): Secondary | ICD-10-CM | POA: Diagnosis not present

## 2022-11-24 DIAGNOSIS — M25562 Pain in left knee: Secondary | ICD-10-CM | POA: Diagnosis not present

## 2023-01-12 ENCOUNTER — Other Ambulatory Visit: Payer: Self-pay | Admitting: Internal Medicine

## 2023-01-12 DIAGNOSIS — F19982 Other psychoactive substance use, unspecified with psychoactive substance-induced sleep disorder: Secondary | ICD-10-CM

## 2023-01-18 ENCOUNTER — Ambulatory Visit (INDEPENDENT_AMBULATORY_CARE_PROVIDER_SITE_OTHER): Payer: PPO

## 2023-01-18 DIAGNOSIS — Z0000 Encounter for general adult medical examination without abnormal findings: Secondary | ICD-10-CM

## 2023-01-18 DIAGNOSIS — Z1231 Encounter for screening mammogram for malignant neoplasm of breast: Secondary | ICD-10-CM

## 2023-01-18 NOTE — Patient Instructions (Addendum)
  Ms. Vessel , Thank you for taking time to come for your Medicare Wellness Visit. I appreciate your ongoing commitment to your health goals. Please review the following plan we discussed and let me know if I can assist you in the future.   You can call [phone] to schedule your mammogram You can get the TDAP vaccine at your pharmacy.  These are the goals we discussed:  Goals      Exercise 3x per week (30 min per time)     Recommend starting a routine exercise program at least 3 days a week for 30-45 minutes at a time as tolerated.       Patient Stated     Be able to walk and exercise.        This is a list of the screening recommended for you and due dates:  Health Maintenance  Topic Date Due   DTaP/Tdap/Td vaccine (2 - Tdap) 01/15/2021   COVID-19 Vaccine (4 - 2023-24 season) 08/20/2022   Mammogram  05/08/2023   Cologuard (Stool DNA test)  08/13/2023   Medicare Annual Wellness Visit  01/19/2024   Pneumonia Vaccine  Completed   Flu Shot  Completed   DEXA scan (bone density measurement)  Completed   Hepatitis C Screening: USPSTF Recommendation to screen - Ages 27-79 yo.  Completed   Zoster (Shingles) Vaccine  Completed   HPV Vaccine  Aged Out

## 2023-01-18 NOTE — Progress Notes (Signed)
I connected with  Alyssa Durham on 01/18/23 by a audio enabled telemedicine application and verified that I am speaking with the correct person using two identifiers.  Patient Location: Home  Provider Location: Office/Clinic  I discussed the limitations of evaluation and management by telemedicine. The patient expressed understanding and agreed to proceed.  Subjective:   Alyssa Durham is a 72 y.o. female who presents for Medicare Annual (Subsequent) preventive examination.  Review of Systems     Cardiac Risk Factors include: advanced age (>53mn, >>71women);hypertension;smoking/ tobacco exposure     Objective:    There were no vitals filed for this visit. There is no height or weight on file to calculate BMI.     01/18/2023    3:44 PM 10/04/2022    3:10 PM 01/14/2022    1:26 PM 03/30/2021   12:32 PM 10/13/2020   11:45 AM 06/03/2020    3:44 PM 04/30/2019   11:23 AM  Advanced Directives  Does Patient Have a Medical Advance Directive? No No No No No No No  Would patient like information on creating a medical advance directive? Yes (ED - Information included in AVS)  No - Patient declined No - Patient declined Yes (MAU/Ambulatory/Procedural Areas - Information given)      Current Medications (verified) Outpatient Encounter Medications as of 01/18/2023  Medication Sig   albuterol (PROAIR HFA) 108 (90 Base) MCG/ACT inhaler Inhale 2 puffs into the lungs every 6 (six) hours as needed for wheezing or shortness of breath.   Atogepant (QULIPTA) 60 MG TABS Take 1 tablet by mouth daily.   cholecalciferol (VITAMIN D3) 25 MCG (1000 UNIT) tablet Take 1,000 Units by mouth daily.   entecavir (BARACLUDE) 0.5 MG tablet TAKE ONE TABLET BY MOUTH ONCE DAILY.   ibandronate (BONIVA) 150 MG tablet TAKE (1) TABLET ONCE A MONTH ONLY. TAKE WITH 6 TO 8 OZ OF WATER AND DO NOT LAY DOWN FOR 1 HOUR.   magnesium oxide (MAG-OX) 400 MG tablet Take 400 mg by mouth daily.   Multiple Vitamins-Minerals  (HAIR/SKIN/NAILS/BIOTIN PO) Take 1 each by mouth in the morning, at noon, and at bedtime.   mupirocin ointment (BACTROBAN) 2 % Apply 1 application. topically 2 (two) times daily.   polyethylene glycol (MIRALAX / GLYCOLAX) 17 g packet Take 4.25 g by mouth daily.   RELPAX 40 MG tablet May repeat in 2 hours if headache persists or recurs.   tretinoin (RETIN-A) 0.1 % cream Apply 1 application topically at bedtime as needed. Patient states that she uses sporadic   zolpidem (AMBIEN) 5 MG tablet TAKE ONE TABLET BY MOUTH AT BEDTIME AS NEEDED FOR SLEEP.   [DISCONTINUED] benzonatate (TESSALON) 100 MG capsule Take 1 capsule (100 mg total) by mouth 2 (two) times daily as needed for cough.   No facility-administered encounter medications on file as of 01/18/2023.    Allergies (verified) Codeine   History: Past Medical History:  Diagnosis Date   Allergy    Anxiety and depression    Arthritis    Asthma    Cataract    Chronic active type B viral hepatitis ([place]    Depression    Encounter for general adult medical examination with abnormal findings 09/24/2021   History of HPV infection 11/15/2017   HLD (hyperlipidemia) 05/12/2017   Localized swelling of left lower leg 01/01/2021   Migraines    Osteoporosis    Primary hypertension 01/01/2021   Past Surgical History:  Procedure Laterality Date   CESAREAN SECTION  x3   EYE SURGERY  2019 and 2020   cataracts   KNEE SURGERY  2023   LIVER BIOPSY  1993   URETHRAL DILATION     WISDOM TOOTH EXTRACTION     Family History  Problem Relation Age of Onset   Uterine cancer Mother    Cancer Mother    Hearing loss Mother    Vision loss Mother    Migraines Father    Early death Father 59       suicide    Transient ischemic attack Father        multiple    Stroke Father    Migraines Brother    Healthy Daughter    Drug abuse Son        addict - stable on suboxone   Hepatitis B Son    Healthy Son    Cancer Maternal Grandmother        breast    Alcohol abuse Paternal Grandfather    Early death Paternal Aunt        suicide   Mental illness Paternal Aunt    Depression Paternal Aunt    Social History   Socioeconomic History   Marital status: Divorced    Spouse name: Not on file   Number of children: 3   Years of education: 31   Highest education level: Not on file  Occupational History   Occupation: retired    Comment: Chiropractor  Tobacco Use   Smoking status: Former    Packs/day: 2.00    Years: 5.00    Total pack years: 10.00    Types: Cigarettes    Start date: 12/21/1967    Quit date: 12/20/1972    Years since quitting: 50.1    Passive exposure: Past   Smokeless tobacco: Never   Tobacco comments:    I began smoking when I went to college. [place] Use   Vaping Use: Never used  Substance and Sexual Activity   Alcohol use: No   Drug use: No   Sexual activity: Not Currently  Other Topics Concern   Not on file  Social History Narrative   Retired Web designer   Three children  [place], [place] and [place] 2   Right handed   College   Social Determinants of Health   Financial Resource Strain: Medium Risk (01/18/2023)   Overall Financial Resource Strain (CARDIA)    Difficulty of Paying Living Expenses: Somewhat hard  Food Insecurity: No Food Insecurity (01/18/2023)   Hunger Vital Sign    Worried About Running Out of Food in the Last Year: Never true    [place] in the Last Year: Never true  Transportation Needs: No Transportation Needs (01/18/2023)   PRAPARE - Hydrologist (Medical): No    Lack of Transportation (Non-Medical): No  Physical Activity: Inactive (01/18/2023)   Exercise Vital Sign    Days of Exercise per Week: 0 days    Minutes of Exercise per Session: 0 min  Stress: Stress Concern Present (01/18/2023)   [place]    Feeling of Stress : To some  extent  Social Connections: Unknown (01/18/2023)   Social Connection and Isolation Panel [NHANES]    Frequency of Communication with Friends and Family: Patient refused    Frequency of Social Gatherings with Friends and Family: Three times a week    Attends Religious Services: Not  on file    Active Member of Clubs or Organizations: Yes    Attends Archivist Meetings: More than 4 times per year    Marital Status: Divorced    Tobacco Counseling Counseling given: Not Answered Tobacco comments: I began smoking when I went to college. 1969 - 1974   Clinical Intake:  Pre-visit preparation completed: Yes  Pain : No/denies pain     Nutritional Status: BMI 25 -29 Overweight Diabetes: No  How often do you need to have someone help you when you read instructions, pamphlets, or other written materials from your doctor or pharmacy?: 1 - Never  Diabetic? no         Activities of Daily Living    01/18/2023    8:49 AM 01/17/2023    7:44 PM  In your present state of health, do you have any difficulty performing the following activities:  Hearing? 0 0  Vision? 0 0  Difficulty concentrating or making decisions? 1 1  Walking or climbing stairs? 1 1  Dressing or bathing? 0 0  Doing errands, shopping? 0 0  Preparing Food and eating ? N N  Using the Toilet? N N  In the past six months, have you accidently leaked urine? N N  Do you have problems with loss of bowel control? N N  Managing your Medications? N N  Managing your Finances? N N  Housekeeping or managing your Housekeeping? N N    Patient Care Team: Lindell Spar, MD as PCP - General (Internal Medicine) Cameron Sprang, MD as Consulting Physician (Neurology)  Indicate any recent Medical Services you may have received from other than [facility] providers in the past year (date may be approximate).     Assessment:   This is a routine wellness examination for Alyssa Durham.  Hearing/Vision screen No results found.  Dietary  issues and exercise activities discussed:     Goals Addressed             This Visit's Progress    Patient Stated   On track    Be able to walk and exercise.       Depression Screen    09/28/2022   11:10 AM 08/02/2022    9:09 AM 07/19/2022   10:30 AM 04/28/2022   11:06 AM 01/14/2022    1:27 PM 01/14/2022    1:23 PM 12/23/2021   10:25 AM  PHQ 2/9 Scores  PHQ - 2 Score 0 0 0 0 0 0 0    Fall Risk    01/18/2023    8:49 AM 01/17/2023    7:44 PM 10/04/2022    3:10 PM 09/28/2022   11:10 AM 08/02/2022    9:09 AM  [place] in the past year? 0 0 0 0 0  Number falls in past yr: 0 0 0 0 0  Injury with Fall? 0 0 0 0 0  Risk for fall due to :    No Fall Risks No Fall Risks  Follow up   Falls evaluation completed Falls evaluation completed Falls evaluation completed    [place]:  Any stairs in or around the home? Yes  If so, are there any without handrails? No  Home free of loose throw rugs in walkways, pet beds, electrical cords, etc? Yes  Adequate lighting in your home to reduce risk of falls? Yes   ASSISTIVE DEVICES UTILIZED TO PREVENT FALLS:  Life alert? No  Use of  a cane, walker or w/c? No  Grab bars in the bathroom? Yes  Shower chair or bench in shower? No  Elevated toilet seat or a handicapped toilet? No    Cognitive Function:    01/14/2022    1:28 PM 08/16/2016   10:00 AM  MMSE - Mini Mental State Exam  Not completed: Unable to complete   Orientation to time  5  Orientation to Place  5  Registration  3  Attention/ Calculation  5  Recall  3  Language- name 2 objects  2  Language- repeat  1  Language- follow 3 step command  3  Language- read & follow direction  1  Write a sentence  1  Copy design  1  Total score  30        01/18/2023    3:37 PM 01/14/2022    1:28 PM  6CIT Screen  What Year? 0 points 0 points  What month? 0 points 0 points  What time? 0 points 0 points  Count back from 20 0 points 0 points   Months in reverse 0 points 0 points  Repeat phrase 0 points 0 points  Total Score 0 points 0 points    Immunizations Immunization History  Administered Date(s) Administered   Fluad Quad(high Dose 65+) 09/14/2019, 10/22/2021, 09/28/2022   Influenza,inj,Quad PF,6+ Mos 09/19/2017   Influenza,inj,quad, With Preservative 09/19/2017, 09/20/2019   Influenza-Unspecified 09/19/2018   Moderna Sars-Covid-2 Vaccination 02/14/2020, 03/13/2020, 11/13/2020   Pneumococcal Conjugate-13 03/26/2015   Pneumococcal Polysaccharide-23 07/21/2011, 09/19/2017   Td 01/15/2011   Zoster Recombinat (Shingrix) 05/18/2022, 08/04/2022   Zoster, Live 03/23/2010    TDAP status: Due, Education has been provided regarding the importance of this vaccine. Advised may receive this vaccine at local pharmacy or Health Dept. Aware to provide a copy of the vaccination record if obtained from local pharmacy or Health Dept. Verbalized acceptance and understanding.  Flu Vaccine status: Up to date  Pneumococcal vaccine status: Up to date  Covid-19 vaccine status: Completed vaccines  Qualifies for Shingles Vaccine? Yes   Zostavax completed No   Shingrix Completed?: Yes  Screening Tests Health Maintenance  Topic Date Due   DTaP/Tdap/Td (2 - Tdap) 01/15/2021   COVID-19 Vaccine (4 - 2023-24 season) 08/20/2022   MAMMOGRAM  05/08/2023   Fecal DNA (Cologuard)  08/13/2023   Medicare Annual Wellness (AWV)  01/19/2024   Pneumonia Vaccine 40+ Years old  Completed   INFLUENZA VACCINE  Completed   DEXA SCAN  Completed   Hepatitis C Screening  Completed   Zoster Vaccines- Shingrix  Completed   HPV VACCINES  Aged Out    Health Maintenance  Health Maintenance Due  Topic Date Due   DTaP/Tdap/Td (2 - Tdap) 01/15/2021   COVID-19 Vaccine (4 - 2023-24 season) 08/20/2022    Colorectal cancer screening: Type of screening: Cologuard. Completed due again 07/2023. Repeat every 3 years  Mammogram status: Ordered today. Pt provided  with contact info and advised to call to schedule appt.   Bone density- up to date- done in 2023 Lung Cancer Screening: (Low Dose CT Chest recommended if Age 45-80 years, 30 pack-year currently smoking OR have quit w/in 15years.) does not qualify.   Lung Cancer Screening Referral: na  Additional Screening:  Hepatitis C Screening: does not qualify; Completed yes  Vision Screening: Recommended annual ophthalmology exams for early detection of glaucoma and other disorders of the eye. Is the patient up to date with their annual eye exam?  no Who is  the provider or what is the name of the office in which the patient attends annual eye exams? [email or web address] If pt is not established with a provider, would they like to be referred to a provider to establish care? No .   Dental Screening: Recommended annual dental exams for proper oral hygiene  Community Resource Referral / Chronic Care Management: CRR required this visit?  No   CCM required this visit?  No      Plan:     I have personally reviewed and noted the following in the patient's chart:   Medical and social history Use of alcohol, tobacco or illicit drugs  Current medications and supplements including opioid prescriptions. Patient is not currently taking opioid prescriptions. Functional ability and status Nutritional status Physical activity Advanced directives List of other physicians Hospitalizations, surgeries, and ER visits in previous 12 months Vitals Screenings to include cognitive, depression, and falls Referrals and appointments  In addition, I have reviewed and discussed with patient certain preventive protocols, quality metrics, and best practice recommendations. A written personalized care plan for preventive services as well as general preventive health recommendations were provided to patient.     Eual Fines, LPN   12/30/7354   Nurse Notes: Schedule your next wellness visit for 1 year at checkout

## 2023-01-26 DIAGNOSIS — K136 Irritative hyperplasia of oral mucosa: Secondary | ICD-10-CM | POA: Diagnosis not present

## 2023-02-01 ENCOUNTER — Encounter: Payer: Self-pay | Admitting: Internal Medicine

## 2023-02-01 ENCOUNTER — Ambulatory Visit (INDEPENDENT_AMBULATORY_CARE_PROVIDER_SITE_OTHER): Payer: PPO | Admitting: Internal Medicine

## 2023-02-01 VITALS — BP 142/92 | HR 77 | Ht 61.0 in | Wt 156.6 lb

## 2023-02-01 DIAGNOSIS — G43819 Other migraine, intractable, without status migrainosus: Secondary | ICD-10-CM | POA: Diagnosis not present

## 2023-02-01 DIAGNOSIS — G4700 Insomnia, unspecified: Secondary | ICD-10-CM

## 2023-02-01 DIAGNOSIS — I10 Essential (primary) hypertension: Secondary | ICD-10-CM | POA: Diagnosis not present

## 2023-02-01 DIAGNOSIS — M810 Age-related osteoporosis without current pathological fracture: Secondary | ICD-10-CM

## 2023-02-01 DIAGNOSIS — B181 Chronic viral hepatitis B without delta-agent: Secondary | ICD-10-CM | POA: Diagnosis not present

## 2023-02-01 MED ORDER — AMLODIPINE BESYLATE 2.5 MG PO TABS: 2.5000 mg | ORAL_TABLET | Freq: Every day | ORAL | 3 refills | Status: DC

## 2023-02-01 NOTE — Assessment & Plan Note (Signed)
Last DEXA scan reviewed On Boniva now On Calcium and Vitamin D supplement (Caltrate)

## 2023-02-01 NOTE — Progress Notes (Signed)
Established Patient Office Visit  Subjective:  Patient ID: Alyssa Durham, female    DOB: 01/26/1951  Age: 72 y.o. MRN: [id number]  CC:  Chief Complaint  Patient presents with   Hypertension    Four month follow up for hypertension and insomnia     HPI Alyssa Durham is a 72 y.o. female with past medical history of chronic migraine, mild intermittent asthma related to allergies, osteoporosis, chronic hep B, lichen sclerosus, and gluteal tendinitis of right buttock who presents for f/u of her chronic medical conditions.  HTN: Her blood pressure was elevated today.  She reports that her BP machine has been showing high BP readings at home as well.  She has had dull headache while exerting herself.  She was told of very high BP during a recent dental procedure.  She currently denies any chest pain, dyspnea or palpitations.  Patient follows up with neurology for chronic migraine and takes Relpax and Qulipta for it.  She was on Nurtec in the past.   She has history of osteoporosis, for which she takes [place].  She agrees to start taking Caltrate 600+ D3 now.   She has chronic insomnia, for which she takes Ambien as needed.  Denies any anhedonia, SI or HI currently.    Past Medical History:  Diagnosis Date   Allergy    Anxiety and depression    Arthritis    Asthma    Cataract    Chronic active type B viral hepatitis ([place])    Depression    Encounter for general adult medical examination with abnormal findings 09/24/2021   History of HPV infection 11/15/2017   HLD (hyperlipidemia) 05/12/2017   Localized swelling of left lower leg 01/01/2021   Migraines    Osteoporosis    Primary hypertension 01/01/2021    Past Surgical History:  Procedure Laterality Date   CESAREAN SECTION     x3   EYE SURGERY  2019 and 2020   cataracts   KNEE SURGERY  2023   LIVER BIOPSY  1993   URETHRAL DILATION     WISDOM TOOTH EXTRACTION      Family History  Problem Relation Age of Onset    Uterine cancer Mother    Cancer Mother    Hearing loss Mother    Vision loss Mother    Migraines Father    Early death Father 55       suicide    Transient ischemic attack Father        multiple    Stroke Father    Migraines Brother    Healthy Daughter    Drug abuse Son        addict - stable on suboxone   Hepatitis B Son    Healthy Son    Cancer Maternal Grandmother        breast   Alcohol abuse Paternal Grandfather    Early death Paternal Aunt        suicide   Mental illness Paternal Aunt    Depression Paternal Aunt     Social History   Socioeconomic History   Marital status: Divorced    Spouse name: Not on file   Number of children: 3   Years of education: 70   Highest education level: Not on file  Occupational History   Occupation: retired    Comment: Chiropractor  Tobacco Use   Smoking status: Former    Packs/day: 2.00    Years: 5.00  Total pack years: 10.00    Types: Cigarettes    Start date: 12/21/1967    Quit date: 12/20/1972    Years since quitting: 50.1    Passive exposure: Past   Smokeless tobacco: Never   Tobacco comments:    I began smoking when I went to college. [place] Use   Vaping Use: Never used  Substance and Sexual Activity   Alcohol use: No   Drug use: No   Sexual activity: Not Currently  Other Topics Concern   Not on file  Social History Narrative   Retired Web designer   Three children  [place], [place] and [place] 2   Right handed   College   Social Determinants of Health   Financial Resource Strain: Medium Risk (01/18/2023)   Overall Financial Resource Strain (CARDIA)    Difficulty of Paying Living Expenses: Somewhat hard  Food Insecurity: No Food Insecurity (01/18/2023)   Hunger Vital Sign    Worried About Running Out of Food in the Last Year: Never true    [place] in the Last Year: Never true  Transportation Needs: No Transportation Needs (01/18/2023)   PRAPARE -  Hydrologist (Medical): No    Lack of Transportation (Non-Medical): No  Physical Activity: Inactive (01/18/2023)   Exercise Vital Sign    Days of Exercise per Week: 0 days    Minutes of Exercise per Session: 0 min  Stress: Stress Concern Present (01/18/2023)   [place]    Feeling of Stress : To some extent  Social Connections: Unknown (01/18/2023)   Social Connection and Isolation Panel [NHANES]    Frequency of Communication with Friends and Family: Patient refused    Frequency of Social Gatherings with Friends and Family: Three times a week    Attends Religious Services: Not on file    Active Member of Clubs or Organizations: Yes    Attends Club or Organization Meetings: More than 4 times per year    Marital Status: Divorced  Intimate Partner Violence: Not At Risk (01/14/2022)   Humiliation, Afraid, Rape, and Kick questionnaire    Fear of Current or Ex-Partner: No    Emotionally Abused: No    Physically Abused: No    Sexually Abused: No    Outpatient Medications Prior to Visit  Medication Sig Dispense Refill   albuterol (PROAIR HFA) 108 (90 Base) MCG/ACT inhaler Inhale 2 puffs into the lungs every 6 (six) hours as needed for wheezing or shortness of breath. 18 g 5   Atogepant (QULIPTA) 60 MG TABS Take 1 tablet by mouth daily. 30 tablet 12   cholecalciferol (VITAMIN D3) 25 MCG (1000 UNIT) tablet Take 1,000 Units by mouth daily.     entecavir (BARACLUDE) 0.5 MG tablet TAKE ONE TABLET BY MOUTH ONCE DAILY. 90 tablet 3   ibandronate (BONIVA) 150 MG tablet TAKE (1) TABLET ONCE A MONTH ONLY. TAKE WITH 6 TO 8 OZ OF WATER AND DO NOT LAY DOWN FOR 1 HOUR. 3 tablet 3   magnesium oxide (MAG-OX) 400 MG tablet Take 400 mg by mouth daily.     Multiple Vitamins-Minerals (HAIR/SKIN/NAILS/BIOTIN PO) Take 1 each by mouth in the morning, at noon, and at bedtime.     mupirocin ointment (BACTROBAN) 2 % Apply 1  application. topically 2 (two) times daily. 22 g 0   polyethylene glycol (MIRALAX / GLYCOLAX) 17 g packet Take  4.25 g by mouth daily.     RELPAX 40 MG tablet May repeat in 2 hours if headache persists or recurs. 12 tablet 11   tretinoin (RETIN-A) 0.1 % cream Apply 1 application topically at bedtime as needed. Patient states that she uses sporadic     zolpidem (AMBIEN) 5 MG tablet TAKE ONE TABLET BY MOUTH AT BEDTIME AS NEEDED FOR SLEEP. 30 tablet 1   No facility-administered medications prior to visit.    Allergies  Allergen Reactions   Codeine Nausea And Vomiting and Other (See Comments)    ROS Review of Systems  Constitutional:  Negative for chills and fever.  HENT:  Negative for congestion, postnasal drip, rhinorrhea, sinus pressure, sinus pain and sore throat.   Eyes:  Negative for pain and discharge.  Respiratory:  Negative for cough and shortness of breath.   Cardiovascular:  Negative for chest pain and palpitations.  Gastrointestinal:  Negative for abdominal pain, diarrhea, nausea and vomiting.  Endocrine: Negative for polydipsia and polyuria.  Genitourinary:  Negative for dysuria and hematuria.  Musculoskeletal:  Positive for arthralgias, back pain and joint swelling. Negative for neck pain and neck stiffness.  Skin:  Negative for rash.  Neurological:  Negative for dizziness, seizures, syncope and weakness.  Psychiatric/Behavioral:  Negative for agitation and behavioral problems.       Objective:    Physical Exam Vitals reviewed.  Constitutional:      General: She is not in acute distress.    Appearance: She is not diaphoretic.  HENT:     Head: Normocephalic and atraumatic.     Nose: Nose normal. No congestion.     Mouth/Throat:     Mouth: Mucous membranes are moist.     Pharynx: No posterior oropharyngeal erythema.  Eyes:     General: No scleral icterus.    Extraocular Movements: Extraocular movements intact.  Neck:     Vascular: No carotid bruit.   Cardiovascular:     Rate and Rhythm: Normal rate and regular rhythm.     Pulses: Normal pulses.     Heart sounds: Normal heart sounds. No murmur heard. Pulmonary:     Breath sounds: Normal breath sounds. No wheezing or rales.  Abdominal:     Palpations: Abdomen is soft.     Tenderness: There is no abdominal tenderness.  Musculoskeletal:     Right hand: Swelling (Mild PIP and DIP joints) present.     Left hand: Swelling (Mild PIP and DIP joints) present.     Cervical back: Neck supple. No tenderness.  Skin:    General: Skin is warm.     Findings: No rash.  Neurological:     General: No focal deficit present.     Mental Status: She is alert and oriented to person, place, and time.     Cranial Nerves: No cranial nerve deficit.     Sensory: No sensory deficit.     Motor: No weakness.  Psychiatric:        Mood and Affect: Mood normal.        Behavior: Behavior normal.     BP (!) 142/92 (BP Location: Left Arm)   Pulse 77   Ht 5' 1"$  (1.549 m)   Wt 156 lb 9.6 oz (71 kg)   SpO2 98%   BMI 29.59 kg/m  Wt Readings from Last 3 Encounters:  02/01/23 156 lb 9.6 oz (71 kg)  10/26/22 156 lb 6.4 oz (70.9 kg)  10/04/22 155 lb 6.4 oz (70.5 kg)  Lab Results  Component Value Date   TSH 1.760 03/17/2022   Lab Results  Component Value Date   WBC 5.2 10/01/2022   HGB 15.8 10/01/2022   HCT 48.1 (H) 10/01/2022   MCV 92 10/01/2022   PLT 341 10/01/2022   Lab Results  Component Value Date   NA 142 10/01/2022   K 4.4 10/01/2022   CO2 24 10/01/2022   GLUCOSE 98 10/01/2022   BUN 18 10/01/2022   CREATININE 0.74 10/01/2022   BILITOT 0.5 10/01/2022   ALKPHOS 75 10/01/2022   AST 23 10/01/2022   ALT 13 10/01/2022   PROT 6.9 10/01/2022   ALBUMIN 4.5 10/01/2022   CALCIUM 9.3 10/01/2022   EGFR 86 10/01/2022   Lab Results  Component Value Date   CHOL 225 (H) 10/01/2022   Lab Results  Component Value Date   HDL 77 10/01/2022   Lab Results  Component Value Date   LDLCALC 129  (H) 10/01/2022   Lab Results  Component Value Date   TRIG 110 10/01/2022   Lab Results  Component Value Date   CHOLHDL 2.9 10/01/2022   Lab Results  Component Value Date   HGBA1C 5.1 03/17/2022      Assessment & Plan:   Problem List Items Addressed This Visit       Cardiovascular and Mediastinum   Migraine    On Qulipta and Relpax Follows up with Neurology      Relevant Medications   amLODipine (NORVASC) 2.5 MG tablet   Primary hypertension - Primary    BP Readings from Last 1 Encounters:  02/01/23 (!) 142/92  Uncontrolled with diet modification and increased activity Added Amlodipine 2.5 mg QD Had component of white coat hypertension Advised DASH diet and moderate exercise/walking, at least 150 mins/week      Relevant Medications   amLODipine (NORVASC) 2.5 MG tablet     Digestive   Chronic hepatitis B (HCC)    On Baraclude Followed by GI        Musculoskeletal and Integument   Osteoporosis    Last DEXA scan reviewed On Boniva now On Calcium and Vitamin D supplement (Caltrate)        Other   Insomnia    Takes Ambien 5 mg nightly as needed       Meds ordered this encounter  Medications   amLODipine (NORVASC) 2.5 MG tablet    Sig: Take 1 tablet (2.5 mg total) by mouth daily.    Dispense:  30 tablet    Refill:  3    Follow-up: Return in about 6 weeks (around 03/15/2023) for HTN.    Lindell Spar, MD

## 2023-02-01 NOTE — Assessment & Plan Note (Signed)
On Qulipta and Relpax Follows up with Neurology

## 2023-02-01 NOTE — Assessment & Plan Note (Signed)
On Baraclude Followed by GI

## 2023-02-01 NOTE — Assessment & Plan Note (Signed)
Takes Ambien 5 mg nightly as needed

## 2023-02-01 NOTE — Patient Instructions (Signed)
Please start taking Amlodipine as prescribed.  Please continue to take other medications as prescribed.  Please continue to follow low salt diet and ambulate as tolerated.

## 2023-02-01 NOTE — Assessment & Plan Note (Addendum)
BP Readings from Last 1 Encounters:  02/01/23 (!) 142/92   Uncontrolled with diet modification and increased activity Added Amlodipine 2.5 mg QD Had component of white coat hypertension Advised DASH diet and moderate exercise/walking, at least 150 mins/week

## 2023-02-14 DIAGNOSIS — H26493 Other secondary cataract, bilateral: Secondary | ICD-10-CM | POA: Diagnosis not present

## 2023-02-17 ENCOUNTER — Encounter: Payer: Self-pay | Admitting: Radiology

## 2023-02-23 ENCOUNTER — Telehealth: Payer: Self-pay | Admitting: Anesthesiology

## 2023-02-23 NOTE — Telephone Encounter (Signed)
Pt's Alyssa Durham needs a PA.

## 2023-03-01 ENCOUNTER — Other Ambulatory Visit (HOSPITAL_COMMUNITY): Payer: Self-pay

## 2023-03-01 NOTE — Telephone Encounter (Signed)
Pt called back in and left a message. She is wanting to find out if our office ever got prior authorization for the Qulipta?

## 2023-03-02 ENCOUNTER — Telehealth: Payer: Self-pay | Admitting: Pharmacy Technician

## 2023-03-02 ENCOUNTER — Other Ambulatory Visit (HOSPITAL_COMMUNITY): Payer: Self-pay

## 2023-03-02 NOTE — Telephone Encounter (Signed)
Patient Advocate Encounter  Received notification from [place] that prior authorization for QULIPTA '60MG'$  is required.   PA submitted on 3.11.24 and again on 3.13.24 via FAX as URGENT FAX# [phone] Status is pending

## 2023-03-07 NOTE — Telephone Encounter (Signed)
PA submitted in 03/02/23 encounter

## 2023-03-09 ENCOUNTER — Ambulatory Visit (HOSPITAL_COMMUNITY)
Admission: RE | Admit: 2023-03-09 | Discharge: 2023-03-09 | Disposition: A | Discharge status: Home / Self Care | Payer: PPO | Source: Ambulatory Visit | Attending: Internal Medicine | Admitting: Internal Medicine

## 2023-03-09 DIAGNOSIS — Z1231 Encounter for screening mammogram for malignant neoplasm of breast: Secondary | ICD-10-CM | POA: Diagnosis not present

## 2023-03-15 ENCOUNTER — Encounter: Payer: Self-pay | Admitting: Internal Medicine

## 2023-03-15 ENCOUNTER — Ambulatory Visit (INDEPENDENT_AMBULATORY_CARE_PROVIDER_SITE_OTHER): Payer: PPO | Admitting: Internal Medicine

## 2023-03-15 VITALS — BP 133/80 | HR 77 | Ht 61.0 in | Wt 156.8 lb

## 2023-03-15 DIAGNOSIS — I10 Essential (primary) hypertension: Secondary | ICD-10-CM | POA: Diagnosis not present

## 2023-03-15 DIAGNOSIS — G43819 Other migraine, intractable, without status migrainosus: Secondary | ICD-10-CM | POA: Diagnosis not present

## 2023-03-15 DIAGNOSIS — M810 Age-related osteoporosis without current pathological fracture: Secondary | ICD-10-CM | POA: Diagnosis not present

## 2023-03-15 DIAGNOSIS — E663 Overweight: Secondary | ICD-10-CM | POA: Diagnosis not present

## 2023-03-15 DIAGNOSIS — G4700 Insomnia, unspecified: Secondary | ICD-10-CM

## 2023-03-15 DIAGNOSIS — H9113 Presbycusis, bilateral: Secondary | ICD-10-CM

## 2023-03-15 NOTE — Progress Notes (Signed)
Established Patient Office Visit  Subjective:  Patient ID: Alyssa Durham, female    DOB: September 16, 1951  Age: 72 y.o. MRN: [id number]  CC:  Chief Complaint  Patient presents with   Hypertension    Six week hypertension. Patient would like to have her hearing checked     HPI Alyssa Durham is a 72 y.o. female with past medical history of chronic migraine, mild intermittent asthma related to allergies, osteoporosis, chronic hep B, lichen sclerosus, and gluteal tendinitis of right buttock who presents for f/u of her chronic medical conditions.  HTN: Her blood pressure was well controlled today.  She reports that her BP machine has been showing better BP readings at home as well. She currently denies any chest pain, dyspnea or palpitations.  Patient follows up with neurology for chronic migraine and takes Relpax and Qulipta for it.  She was on Nurtec in the past.   She has history of osteoporosis, for which she takes [place].  She agrees to start taking Caltrate 600+ D3 now.   She has chronic insomnia, for which she takes Ambien as needed.  Denies any anhedonia, SI or HI currently.  She reports hearing problem, especially in crowded places.  Denies any ear pain or discharge currently.   Past Medical History:  Diagnosis Date   Allergy    Anxiety and depression    Arthritis    Asthma    Cataract    Chronic active type B viral hepatitis ([place])    Depression    Encounter for general adult medical examination with abnormal findings 09/24/2021   History of HPV infection 11/15/2017   HLD (hyperlipidemia) 05/12/2017   Localized swelling of left lower leg 01/01/2021   Migraines    Osteoporosis    Primary hypertension 01/01/2021    Past Surgical History:  Procedure Laterality Date   CESAREAN SECTION     x3   EYE SURGERY  2019 and 2020   cataracts   KNEE SURGERY  2023   LIVER BIOPSY  1993   URETHRAL DILATION     WISDOM TOOTH EXTRACTION      Family History  Problem Relation  Age of Onset   Uterine cancer Mother    Cancer Mother    Hearing loss Mother    Vision loss Mother    Migraines Father    Early death Father 69       suicide    Transient ischemic attack Father        multiple    Stroke Father    Migraines Brother    Healthy Daughter    Drug abuse Son        addict - stable on suboxone   Hepatitis B Son    Healthy Son    Cancer Maternal Grandmother        breast   Alcohol abuse Paternal Grandfather    Early death Paternal Aunt        suicide   Mental illness Paternal Aunt    Depression Paternal Aunt     Social History   Socioeconomic History   Marital status: Divorced    Spouse name: Not on file   Number of children: 3   Years of education: 33   Highest education level: Not on file  Occupational History   Occupation: retired    Comment: Chiropractor  Tobacco Use   Smoking status: Former    Packs/day: 2.00    Years: 5.00    Additional pack years:  0.00    Total pack years: 10.00    Types: Cigarettes    Start date: 12/21/1967    Quit date: 12/20/1972    Years since quitting: 50.2    Passive exposure: Past   Smokeless tobacco: Never   Tobacco comments:    I began smoking when I went to college. [place] Use   Vaping Use: Never used  Substance and Sexual Activity   Alcohol use: No   Drug use: No   Sexual activity: Not Currently  Other Topics Concern   Not on file  Social History Narrative   Retired Web designer   Three children  [place] and [employer] - 2   Right handed   College   Social Determinants of Health   Financial Resource Strain: Low Risk  (03/14/2023)   Overall Financial Resource Strain (CARDIA)    Difficulty of Paying Living Expenses: Not very hard  Recent Concern: Financial Resource Strain - Medium Risk (01/18/2023)   Overall Financial Resource Strain (CARDIA)    Difficulty of Paying Living Expenses: Somewhat hard  Food Insecurity: No Food Insecurity  (03/14/2023)   Hunger Vital Sign    Worried About Running Out of Food in the Last Year: Never true    Ran Out of Food in the Last Year: Never true  Transportation Needs: No Transportation Needs (03/14/2023)   PRAPARE - Hydrologist (Medical): No    Lack of Transportation (Non-Medical): No  Physical Activity: Inactive (03/14/2023)   Exercise Vital Sign    Days of Exercise per Week: 0 days    Minutes of Exercise per Session: 0 min  Stress: Stress Concern Present (03/14/2023)   [place]    Feeling of Stress : To some extent  Social Connections: Moderately Integrated (03/14/2023)   Social Connection and Isolation Panel [NHANES]    Frequency of Communication with Friends and Family: More than three times a week    Frequency of Social Gatherings with Friends and Family: Three times a week    Attends Religious Services: More than 4 times per year    Active Member of Clubs or Organizations: Yes    Attends Archivist Meetings: More than 4 times per year    Marital Status: Divorced  Intimate Partner Violence: Not At Risk (01/14/2022)   Humiliation, Afraid, Rape, and Kick questionnaire    Fear of Current or Ex-Partner: No    Emotionally Abused: No    Physically Abused: No    Sexually Abused: No    Outpatient Medications Prior to Visit  Medication Sig Dispense Refill   albuterol (PROAIR HFA) 108 (90 Base) MCG/ACT inhaler Inhale 2 puffs into the lungs every 6 (six) hours as needed for wheezing or shortness of breath. 18 g 5   amLODipine (NORVASC) 2.5 MG tablet Take 1 tablet (2.5 mg total) by mouth daily. 30 tablet 3   Atogepant (QULIPTA) 60 MG TABS Take 1 tablet by mouth daily. 30 tablet 12   cholecalciferol (VITAMIN D3) 25 MCG (1000 UNIT) tablet Take 1,000 Units by mouth daily.     entecavir (BARACLUDE) 0.5 MG tablet TAKE ONE TABLET BY MOUTH ONCE DAILY. 90 tablet 3   ibandronate (BONIVA) 150  MG tablet TAKE (1) TABLET ONCE A MONTH ONLY. TAKE WITH 6 TO 8 OZ OF WATER AND DO NOT LAY DOWN FOR 1 HOUR. 3 tablet 3   magnesium oxide (MAG-OX) 400  MG tablet Take 400 mg by mouth daily.     Multiple Vitamins-Minerals (HAIR/SKIN/NAILS/BIOTIN PO) Take 1 each by mouth in the morning, at noon, and at bedtime.     mupirocin ointment (BACTROBAN) 2 % Apply 1 application. topically 2 (two) times daily. 22 g 0   polyethylene glycol (MIRALAX / GLYCOLAX) 17 g packet Take 4.25 g by mouth daily.     RELPAX 40 MG tablet May repeat in 2 hours if headache persists or recurs. 12 tablet 11   tretinoin (RETIN-A) 0.1 % cream Apply 1 application topically at bedtime as needed. Patient states that she uses sporadic     zolpidem (AMBIEN) 5 MG tablet TAKE ONE TABLET BY MOUTH AT BEDTIME AS NEEDED FOR SLEEP. 30 tablet 1   No facility-administered medications prior to visit.    Allergies  Allergen Reactions   Codeine Nausea And Vomiting and Other (See Comments)    ROS Review of Systems  Constitutional:  Negative for chills and fever.  HENT:  Positive for hearing loss. Negative for congestion, postnasal drip, rhinorrhea, sinus pressure, sinus pain and sore throat.   Eyes:  Negative for pain and discharge.  Respiratory:  Negative for cough and shortness of breath.   Cardiovascular:  Negative for chest pain and palpitations.  Gastrointestinal:  Negative for abdominal pain, diarrhea, nausea and vomiting.  Endocrine: Negative for polydipsia and polyuria.  Genitourinary:  Negative for dysuria and hematuria.  Musculoskeletal:  Positive for arthralgias, back pain and joint swelling. Negative for neck pain and neck stiffness.  Skin:  Negative for rash.  Neurological:  Negative for dizziness, seizures, syncope and weakness.  Psychiatric/Behavioral:  Negative for agitation and behavioral problems.       Objective:    Physical Exam Vitals reviewed.  Constitutional:      General: She is not in acute distress.     Appearance: She is not diaphoretic.  HENT:     Head: Normocephalic and atraumatic.     Nose: Nose normal. No congestion.     Mouth/Throat:     Mouth: Mucous membranes are moist.     Pharynx: No posterior oropharyngeal erythema.  Eyes:     General: No scleral icterus.    Extraocular Movements: Extraocular movements intact.  Neck:     Vascular: No carotid bruit.  Cardiovascular:     Rate and Rhythm: Normal rate and regular rhythm.     Pulses: Normal pulses.     Heart sounds: Normal heart sounds. No murmur heard. Pulmonary:     Breath sounds: Normal breath sounds. No wheezing or rales.  Abdominal:     Palpations: Abdomen is soft.     Tenderness: There is no abdominal tenderness.  Musculoskeletal:     Right hand: Swelling (Mild PIP and DIP joints) present.     Left hand: Swelling (Mild PIP and DIP joints) present.     Cervical back: Neck supple. No tenderness.  Skin:    General: Skin is warm.     Findings: No rash.  Neurological:     General: No focal deficit present.     Mental Status: She is alert and oriented to person, place, and time.     Cranial Nerves: No cranial nerve deficit.     Sensory: No sensory deficit.     Motor: No weakness.  Psychiatric:        Mood and Affect: Mood normal.        Behavior: Behavior normal.     BP 133/80 (BP Location:  Left Arm, Patient Position: Sitting, Cuff Size: Normal)   Pulse 77   Ht 5\' 1"  (1.549 m)   Wt 156 lb 12.8 oz (71.1 kg)   SpO2 99%   BMI 29.63 kg/m  Wt Readings from Last 3 Encounters:  03/15/23 156 lb 12.8 oz (71.1 kg)  02/01/23 156 lb 9.6 oz (71 kg)  10/26/22 156 lb 6.4 oz (70.9 kg)    Lab Results  Component Value Date   TSH 1.760 03/17/2022   Lab Results  Component Value Date   WBC 5.2 10/01/2022   HGB 15.8 10/01/2022   HCT 48.1 (H) 10/01/2022   MCV 92 10/01/2022   PLT 341 10/01/2022   Lab Results  Component Value Date   NA 142 10/01/2022   K 4.4 10/01/2022   CO2 24 10/01/2022   GLUCOSE 98 10/01/2022    BUN 18 10/01/2022   CREATININE 0.74 10/01/2022   BILITOT 0.5 10/01/2022   ALKPHOS 75 10/01/2022   AST 23 10/01/2022   ALT 13 10/01/2022   PROT 6.9 10/01/2022   ALBUMIN 4.5 10/01/2022   CALCIUM 9.3 10/01/2022   EGFR 86 10/01/2022   Lab Results  Component Value Date   CHOL 225 (H) 10/01/2022   Lab Results  Component Value Date   HDL 77 10/01/2022   Lab Results  Component Value Date   LDLCALC 129 (H) 10/01/2022   Lab Results  Component Value Date   TRIG 110 10/01/2022   Lab Results  Component Value Date   CHOLHDL 2.9 10/01/2022   Lab Results  Component Value Date   HGBA1C 5.1 03/17/2022      Assessment & Plan:   Problem List Items Addressed This Visit       Cardiovascular and Mediastinum   Migraine    On Qulipta and Relpax Follows up with Neurology      Primary hypertension - Primary    BP Readings from Last 1 Encounters:  03/15/23 133/80  Well controlled with amlodipine 2.5 mg QD Had component of white coat hypertension Advised DASH diet and moderate exercise/walking, at least 150 mins/week        Nervous and Auditory   Presbycusis of both ears    Needs audiology testing Advised to contact local hearing aid/audiology center        Musculoskeletal and Integument   Osteoporosis    Last DEXA scan reviewed On Boniva now On Calcium and Vitamin D supplement (Caltrate)        Other   Overweight (BMI 25.0-29.9)    Advised to continue low-carb diet and ambulate as tolerated She is going to start diet and exercise regimen at local [facility]      Insomnia    Takes Ambien 5 mg nightly as needed      No orders of the defined types were placed in this encounter.   Follow-up: Return in about 4 months (around 07/15/2023) for HTN and migraine.    Lindell Spar, MD

## 2023-03-15 NOTE — Patient Instructions (Signed)
Please continue to take medications as prescribed.  Please continue to follow low salt diet and perform moderate exercise/walking at least 150 mins/week. 

## 2023-03-15 NOTE — Assessment & Plan Note (Signed)
Last DEXA scan reviewed On Boniva now On Calcium and Vitamin D supplement (Caltrate) 

## 2023-03-15 NOTE — Assessment & Plan Note (Signed)
BP Readings from Last 1 Encounters:  03/15/23 133/80   Well controlled with amlodipine 2.5 mg QD Had component of white coat hypertension Advised DASH diet and moderate exercise/walking, at least 150 mins/week

## 2023-03-18 DIAGNOSIS — H9113 Presbycusis, bilateral: Secondary | ICD-10-CM | POA: Insufficient documentation

## 2023-03-18 NOTE — Assessment & Plan Note (Signed)
Takes Ambien 5 mg nightly as needed 

## 2023-03-18 NOTE — Assessment & Plan Note (Signed)
On Qulipta and Relpax Follows up with Neurology 

## 2023-03-18 NOTE — Assessment & Plan Note (Signed)
Needs audiology testing Advised to contact local hearing aid/audiology center

## 2023-03-18 NOTE — Assessment & Plan Note (Addendum)
Advised to continue low-carb diet and ambulate as tolerated She is going to start diet and exercise regimen at local [facility]

## 2023-03-31 DIAGNOSIS — H26492 Other secondary cataract, left eye: Secondary | ICD-10-CM | POA: Diagnosis not present

## 2023-04-08 ENCOUNTER — Other Ambulatory Visit: Payer: Self-pay | Admitting: Internal Medicine

## 2023-04-08 DIAGNOSIS — N904 Leukoplakia of vulva: Secondary | ICD-10-CM

## 2023-05-09 ENCOUNTER — Ambulatory Visit (INDEPENDENT_AMBULATORY_CARE_PROVIDER_SITE_OTHER): Payer: PPO | Admitting: Gastroenterology

## 2023-05-09 ENCOUNTER — Encounter (INDEPENDENT_AMBULATORY_CARE_PROVIDER_SITE_OTHER): Payer: Self-pay | Admitting: Gastroenterology

## 2023-05-09 VITALS — BP 118/68 | HR 63 | Temp 97.5°F | Ht 61.0 in | Wt 154.5 lb

## 2023-05-09 DIAGNOSIS — K7400 Hepatic fibrosis, unspecified: Secondary | ICD-10-CM

## 2023-05-09 DIAGNOSIS — B181 Chronic viral hepatitis B without delta-agent: Secondary | ICD-10-CM | POA: Diagnosis not present

## 2023-05-09 NOTE — Progress Notes (Signed)
Katrinka Blazing, M.D. Gastroenterology & Hepatology [facility] [address] [place], [place] [address]  Primary Care Physician: Anabel Halon, MD [address] [place] [address]  I will communicate my assessment and recommendations to the referring MD via EMR.  Problems: Chronic hepatitis B on Entecavir Advanced liver fibrosis  History of Present Illness: Marielys Dnaja Sleppy is a 72 y.o. female with past medical history of anxiety, depression, arthritis, asthma, HLD, Osteoporosis. HTN. Chronic hep b on Entecavir, who comes for follow-up of hepatitis B.  The patient was last seen on 10/26/2022. At that time, the patient was continued on surveillance for [facility] with ultrasound.  The patient denies having any nausea, vomiting, fever, chills, hematochezia, melena, hematemesis, abdominal distention, abdominal pain, change in color of stool, diarrhea, jaundice, pruritus or weight loss.  She has been taking the Entecavir every day.  Last liver ultrasound with elastography on 10/21/2022 showed low K PA of 4.4.  IQR ratio was 0.2.  There was a questionable nodular contour but negative for any liver masses.  Most recent hepatitis B viral load and surface antigen were performed on 06/15/2022, viral load was negative but patient was still presenting positive hepatitis B surface antigen.  Last Colonoscopy:many years ago in her 18s, cologuard in 07/2020 was negative  Last Endoscopy:never  Past Medical History: Past Medical History:  Diagnosis Date   Allergy    Anxiety and depression    Arthritis    Asthma    Cataract    Chronic active type B viral hepatitis ([facility])    Depression    Encounter for general adult medical examination with abnormal findings 09/24/2021   History of HPV infection 11/15/2017   HLD (hyperlipidemia) 05/12/2017   Localized swelling of left lower leg 01/01/2021   Migraines    Osteoporosis    Primary hypertension  01/01/2021    Past Surgical History: Past Surgical History:  Procedure Laterality Date   CESAREAN SECTION     x3   EYE SURGERY  2019 and 2020   cataracts   KNEE SURGERY  2023   LIVER BIOPSY  1993   URETHRAL DILATION     WISDOM TOOTH EXTRACTION      Family History: Family History  Problem Relation Age of Onset   Uterine cancer Mother    Cancer Mother    Hearing loss Mother    Vision loss Mother    Migraines Father    Early death Father 1       suicide    Transient ischemic attack Father        multiple    Stroke Father    Migraines Brother    Healthy Daughter    Drug abuse Son        addict - stable on suboxone   Hepatitis B Son    Healthy Son    Cancer Maternal Grandmother        breast   Alcohol abuse Paternal Grandfather    Early death Paternal Aunt        suicide   Mental illness Paternal Aunt    Depression Paternal Aunt     Social History: Social History   Tobacco Use  Smoking Status Former   Packs/day: 2.00   Years: 5.00   Additional pack years: 0.00   Total pack years: 10.00   Types: Cigarettes   Start date: 12/21/1967   Quit date: 12/20/1972   Years since quitting: 50.4   Passive exposure: Past  Smokeless Tobacco Never  Tobacco Comments   I began smoking when I went to college. 8 - 69   Social History   Substance and Sexual Activity  Alcohol Use No   Social History   Substance and Sexual Activity  Drug Use No    Allergies: Allergies  Allergen Reactions   Codeine Nausea And Vomiting and Other (See Comments)    Medications: Current Outpatient Medications  Medication Sig Dispense Refill   amLODipine (NORVASC) 2.5 MG tablet Take 1 tablet (2.5 mg total) by mouth daily. 30 tablet 3   Atogepant (QULIPTA) 60 MG TABS Take 1 tablet by mouth daily. 30 tablet 12   cholecalciferol (VITAMIN D3) 25 MCG (1000 UNIT) tablet Take 1,000 Units by mouth daily.     entecavir (BARACLUDE) 0.5 MG tablet TAKE ONE TABLET BY MOUTH ONCE DAILY. 90 tablet  3   ibandronate (BONIVA) 150 MG tablet TAKE (1) TABLET ONCE A MONTH ONLY. TAKE WITH 6 TO 8 OZ OF WATER AND DO NOT LAY DOWN FOR 1 HOUR. 3 tablet 3   magnesium oxide (MAG-OX) 400 MG tablet Take 400 mg by mouth daily.     Multiple Vitamins-Minerals (HAIR/SKIN/NAILS/BIOTIN PO) Take 1 each by mouth in the morning, at noon, and at bedtime.     mupirocin ointment (BACTROBAN) 2 % APPLY 1 APPLICATION TOPICALLY TWICE DAILY. 22 g 0   RELPAX 40 MG tablet May repeat in 2 hours if headache persists or recurs. 12 tablet 11   tretinoin (RETIN-A) 0.1 % cream Apply 1 application topically at bedtime as needed. Patient states that she uses sporadic     zolpidem (AMBIEN) 5 MG tablet TAKE ONE TABLET BY MOUTH AT BEDTIME AS NEEDED FOR SLEEP. 30 tablet 1   No current facility-administered medications for this visit.    Review of Systems: GENERAL: negative for malaise, night sweats HEENT: No changes in hearing or vision, no nose bleeds or other nasal problems. NECK: Negative for lumps, goiter, pain and significant neck swelling RESPIRATORY: Negative for cough, wheezing CARDIOVASCULAR: Negative for chest pain, leg swelling, palpitations, orthopnea GI: SEE HPI MUSCULOSKELETAL: Negative for joint pain or swelling, back pain, and muscle pain. SKIN: Negative for lesions, rash PSYCH: Negative for sleep disturbance, mood disorder and recent psychosocial stressors. HEMATOLOGY Negative for prolonged bleeding, bruising easily, and swollen nodes. ENDOCRINE: Negative for cold or heat intolerance, polyuria, polydipsia and goiter. NEURO: negative for tremor, gait imbalance, syncope and seizures. The remainder of the review of systems is noncontributory.   Physical Exam: BP 118/68 (BP Location: Left Arm, Patient Position: Sitting, Cuff Size: Small)   Pulse 63   Temp (!) 97.5 F (36.4 C) (Temporal)   Ht 5\' 1"  (1.549 m)   Wt 154 lb 8 oz (70.1 kg)   BMI 29.19 kg/m  GENERAL: The patient is AO x3, in no acute  distress. HEENT: Head is normocephalic and atraumatic. EOMI are intact. Mouth is well hydrated and without lesions. NECK: Supple. No masses LUNGS: Clear to auscultation. No presence of rhonchi/wheezing/rales. Adequate chest expansion HEART: RRR, normal s1 and s2. ABDOMEN: Soft, nontender, no guarding, no peritoneal signs, and nondistended. BS +. No masses. EXTREMITIES: Without any cyanosis, clubbing, rash, lesions or edema. NEUROLOGIC: AOx3, no focal motor deficit. SKIN: no jaundice, no rashes  Imaging/Labs: as above  I personally reviewed and interpreted the available labs, imaging and endoscopic files.  Impression and Plan: Jennaya Omariana Randell is a 72 y.o. female with past medical history of anxiety, depression, arthritis, asthma, HLD, Osteoporosis. HTN.  Chronic hep b on Entecavir, who comes for follow-up of hepatitis B.  The patient has been asymptomatic and has been compliant with her Entecavir.  She denies having any complaints.  She is due for repeat blood workup, we will check hepatitis B serologies and repeat CBC and CMP.  Given her history of advanced liver fibrosis on previous imaging, we will repeat an ultrasound today.  For now she will continue on Entecavir every day.  We discussed the natural history of hepatitis B infection, as well as the likelihood rate of cure for this infection.  -Check CBC, CMP, hepatitis B surface antigen and HBV DNA - Schedule liver US - Continue Entecavir daily  All questions were answered.      Katrinka Blazing, MD Gastroenterology and Hepatology [facility]

## 2023-05-09 NOTE — Patient Instructions (Signed)
Perform blood workup Schedule liver US Continue Entecavir daily

## 2023-05-10 DIAGNOSIS — K7400 Hepatic fibrosis, unspecified: Secondary | ICD-10-CM | POA: Diagnosis not present

## 2023-05-10 DIAGNOSIS — B181 Chronic viral hepatitis B without delta-agent: Secondary | ICD-10-CM | POA: Diagnosis not present

## 2023-05-11 ENCOUNTER — Telehealth: Payer: Self-pay | Admitting: Internal Medicine

## 2023-05-11 NOTE — Telephone Encounter (Signed)
Patient called said she takes a low dose blood pressure medicine and has been noticing some swelling above her ankles, asking can she cut back on her dosage to 1/2 pill a day. Call back # [phone]

## 2023-05-12 LAB — CBC WITH DIFFERENTIAL/PLATELET
Basophils Absolute: 0 10*3/uL (ref 0.0–0.2)
Basos: 1 %
EOS (ABSOLUTE): 0.1 10*3/uL (ref 0.0–0.4)
Eos: 1 %
Hematocrit: 46.6 % (ref 34.0–46.6)
Hemoglobin: 15.7 g/dL (ref 11.1–15.9)
Immature Grans (Abs): 0 10*3/uL (ref 0.0–0.1)
Immature Granulocytes: 0 %
Lymphocytes Absolute: 1 10*3/uL (ref 0.7–3.1)
Lymphs: 17 %
MCH: 30.9 pg (ref 26.6–33.0)
MCHC: 33.7 g/dL (ref 31.5–35.7)
MCV: 92 fL (ref 79–97)
Monocytes Absolute: 0.5 10*3/uL (ref 0.1–0.9)
Monocytes: 9 %
Neutrophils Absolute: 4.2 10*3/uL (ref 1.4–7.0)
Neutrophils: 72 %
Platelets: 289 10*3/uL (ref 150–450)
RBC: 5.08 x10E6/uL (ref 3.77–5.28)
RDW: 12.7 % (ref 11.7–15.4)
WBC: 5.8 10*3/uL (ref 3.4–10.8)

## 2023-05-12 LAB — COMPREHENSIVE METABOLIC PANEL
ALT: 19 IU/L (ref 0–32)
AST: 27 IU/L (ref 0–40)
Albumin/Globulin Ratio: 2.1 (ref 1.2–2.2)
Albumin: 4.6 g/dL (ref 3.8–4.8)
Alkaline Phosphatase: 69 IU/L (ref 44–121)
BUN/Creatinine Ratio: 23 (ref 12–28)
BUN: 19 mg/dL (ref 8–27)
Bilirubin Total: 0.5 mg/dL (ref 0.0–1.2)
CO2: 26 mmol/L (ref 20–29)
Calcium: 9.7 mg/dL (ref 8.7–10.3)
Chloride: 100 mmol/L (ref 96–106)
Creatinine, Ser: 0.83 mg/dL (ref 0.57–1.00)
Globulin, Total: 2.2 g/dL (ref 1.5–4.5)
Glucose: 89 mg/dL (ref 70–99)
Potassium: 5 mmol/L (ref 3.5–5.2)
Sodium: 140 mmol/L (ref 134–144)
Total Protein: 6.8 g/dL (ref 6.0–8.5)
eGFR: 74 mL/min/{1.73_m2} (ref >59)

## 2023-05-12 LAB — HEPATITIS B SURFACE AG, CONFIRM: HBsAG Confirmation: POSITIVE — AB

## 2023-05-12 LAB — HEPATITIS B DNA, ULTRAQUANTITATIVE, PCR: HBV DNA SERPL PCR-ACNC: NOT DETECTED IU/mL

## 2023-05-12 LAB — HEPATITIS B SURFACE ANTIGEN

## 2023-05-12 NOTE — Telephone Encounter (Signed)
Patient advised.

## 2023-05-18 ENCOUNTER — Other Ambulatory Visit: Payer: Self-pay | Admitting: Internal Medicine

## 2023-05-18 DIAGNOSIS — F19982 Other psychoactive substance use, unspecified with psychoactive substance-induced sleep disorder: Secondary | ICD-10-CM

## 2023-05-19 ENCOUNTER — Other Ambulatory Visit (INDEPENDENT_AMBULATORY_CARE_PROVIDER_SITE_OTHER): Payer: Self-pay

## 2023-05-19 DIAGNOSIS — B181 Chronic viral hepatitis B without delta-agent: Secondary | ICD-10-CM

## 2023-05-19 MED ORDER — ENTECAVIR 0.5 MG PO TABS
0.5000 mg | ORAL_TABLET | Freq: Every day | ORAL | 3 refills | Status: DC
Start: 2023-05-19 — End: 2024-05-26

## 2023-05-31 ENCOUNTER — Ambulatory Visit (HOSPITAL_COMMUNITY)
Admission: RE | Admit: 2023-05-31 | Discharge: 2023-05-31 | Disposition: A | Discharge status: Home / Self Care | Payer: PPO | Source: Ambulatory Visit | Attending: Gastroenterology | Admitting: Gastroenterology

## 2023-05-31 ENCOUNTER — Encounter (INDEPENDENT_AMBULATORY_CARE_PROVIDER_SITE_OTHER): Payer: Self-pay

## 2023-05-31 DIAGNOSIS — K7400 Hepatic fibrosis, unspecified: Secondary | ICD-10-CM | POA: Insufficient documentation

## 2023-05-31 DIAGNOSIS — B181 Chronic viral hepatitis B without delta-agent: Secondary | ICD-10-CM | POA: Diagnosis not present

## 2023-05-31 DIAGNOSIS — B1910 Unspecified viral hepatitis B without hepatic coma: Secondary | ICD-10-CM | POA: Diagnosis not present

## 2023-06-09 ENCOUNTER — Encounter: Payer: Self-pay | Admitting: Internal Medicine

## 2023-06-09 ENCOUNTER — Ambulatory Visit (INDEPENDENT_AMBULATORY_CARE_PROVIDER_SITE_OTHER): Payer: PPO | Admitting: Internal Medicine

## 2023-06-09 VITALS — BP 138/78 | HR 66 | Ht 61.0 in | Wt 150.6 lb

## 2023-06-09 DIAGNOSIS — E663 Overweight: Secondary | ICD-10-CM | POA: Diagnosis not present

## 2023-06-09 DIAGNOSIS — E559 Vitamin D deficiency, unspecified: Secondary | ICD-10-CM

## 2023-06-09 DIAGNOSIS — M778 Other enthesopathies, not elsewhere classified: Secondary | ICD-10-CM

## 2023-06-09 DIAGNOSIS — M7582 Other shoulder lesions, left shoulder: Secondary | ICD-10-CM | POA: Insufficient documentation

## 2023-06-09 DIAGNOSIS — R739 Hyperglycemia, unspecified: Secondary | ICD-10-CM

## 2023-06-09 DIAGNOSIS — E782 Mixed hyperlipidemia: Secondary | ICD-10-CM | POA: Diagnosis not present

## 2023-06-09 DIAGNOSIS — I10 Essential (primary) hypertension: Secondary | ICD-10-CM

## 2023-06-09 DIAGNOSIS — G4700 Insomnia, unspecified: Secondary | ICD-10-CM | POA: Diagnosis not present

## 2023-06-09 MED ORDER — DICLOFENAC SODIUM 1 % EX GEL
4.0000 g | Freq: Four times a day (QID) | CUTANEOUS | 2 refills | Status: DC
Start: 2023-06-09 — End: 2023-10-24

## 2023-06-09 MED ORDER — CYCLOBENZAPRINE HCL 5 MG PO TABS
5.0000 mg | ORAL_TABLET | Freq: Two times a day (BID) | ORAL | 1 refills | Status: AC | PRN
Start: 2023-06-09 — End: ?

## 2023-06-09 NOTE — Assessment & Plan Note (Signed)
BMI Readings from Last 3 Encounters:  06/09/23 28.46 kg/m  05/09/23 29.19 kg/m  03/15/23 29.63 kg/m   Advised to continue low-carb diet and ambulate as tolerated She is going to exercise program at [facility]

## 2023-06-09 NOTE — Assessment & Plan Note (Signed)
Last vitamin D Lab Results  Component Value Date   VD25OH 38.7 03/17/2022   Takes vitamin D supplements

## 2023-06-09 NOTE — Progress Notes (Signed)
Established Patient Office Visit  Subjective:  Patient ID: Alyssa Durham, female    DOB: 1951-06-06  Age: 72 y.o. MRN: [id number]  CC:  Chief Complaint  Patient presents with   Shoulder Pain    Patient is having left shoulder pain     HPI Alyssa Durham is a 72 y.o. female with past medical history of chronic migraine, mild intermittent asthma related to allergies, osteoporosis, chronic hep B, lichen sclerosus, and gluteal tendinitis of right buttock who presents for f/u of her chronic medical conditions.  HTN: Her blood pressure was elevated today.  She was advised to stop amlodipine by another provider as she was having bilateral ankle swelling.  Her ankle swelling has resolved now and she agrees to start taking amlodipine again as it was a very low-dose.  She currently denies any chest pain, dyspnea or palpitations.  Patient follows up with neurology for chronic migraine and takes Relpax and Qulipta for it.  She was on Nurtec in the past.   She has history of osteoporosis, for which she takes [place].  She agrees to start taking Caltrate 600+ D3 now.   She has chronic insomnia, for which she takes Ambien as needed.  Denies any anhedonia, SI or HI currently.  Left shoulder pain: She reports left shoulder pain for the last 1 week.  [facility] and tried to do some lifting, that caused soreness of the left upper arm area after 2 days of the exercise.  She has worsening of the pain upon abduction, around 90 degrees.  Denies any direct impact injury.  Denies any numbness or tingling of the UE.  Past Medical History:  Diagnosis Date   Allergy    Anxiety and depression    Arthritis    Asthma    Cataract    Chronic active type B viral hepatitis (HCC)    Depression    History of HPV infection 11/15/2017   HLD (hyperlipidemia) 05/12/2017   Localized swelling of left lower leg 01/01/2021   Migraines    Osteoporosis    Primary hypertension  01/01/2021    Past Surgical History:  Procedure Laterality Date   CESAREAN SECTION     x3   EYE SURGERY  2019 and 2020   cataracts   KNEE SURGERY  2023   LIVER BIOPSY  1993   URETHRAL DILATION     WISDOM TOOTH EXTRACTION      Family History  Problem Relation Age of Onset   Uterine cancer Mother    Cancer Mother    Hearing loss Mother    Vision loss Mother    Migraines Father    Early death Father 87       suicide    Transient ischemic attack Father        multiple    Stroke Father    Migraines Brother    Healthy Daughter    Drug abuse Son        addict - stable on suboxone   Hepatitis B Son    Healthy Son    Cancer Maternal Grandmother        breast   Alcohol abuse Paternal Grandfather    Early death Paternal Aunt        suicide   Mental illness Paternal Aunt    Depression Paternal Aunt     Social History   Socioeconomic History   Marital status: Divorced    Spouse name: Not on  file   Number of children: 3   Years of education: 14   Highest education level: Not on file  Occupational History   Occupation: retired    Comment: Advertising account executive  Tobacco Use   Smoking status: Former    Packs/day: 2.00    Years: 5.00    Additional pack years: 0.00    Total pack years: 10.00    Types: Cigarettes    Start date: 12/21/1967    Quit date: 12/20/1972    Years since quitting: 50.5    Passive exposure: Past   Smokeless tobacco: Never   Tobacco comments:    I began smoking when I went to college. 1969 - 1974  Vaping Use   Vaping Use: Never used  Substance and Sexual Activity   Alcohol use: No   Drug use: No   Sexual activity: Not Currently  Other Topics Concern   Not on file  Social History Narrative   Retired Environmental health practitioner   Three children  [place], [place] and [employer] - 2   Right handed   College   Social Determinants of Health   Financial Resource Strain: Low Risk  (03/14/2023)   Overall Financial Resource Strain (CARDIA)     Difficulty of Paying Living Expenses: Not very hard  Recent Concern: Financial Resource Strain - Medium Risk (01/18/2023)   Overall Financial Resource Strain (CARDIA)    Difficulty of Paying Living Expenses: Somewhat hard  Food Insecurity: No Food Insecurity (03/14/2023)   Hunger Vital Sign    Worried About Running Out of Food in the Last Year: Never true    Ran Out of Food in the Last Year: Never true  Transportation Needs: No Transportation Needs (03/14/2023)   PRAPARE - Administrator, Civil Service (Medical): No    Lack of Transportation (Non-Medical): No  Physical Activity: Inactive (03/14/2023)   Exercise Vital Sign    Days of Exercise per Week: 0 days    Minutes of Exercise per Session: 0 min  Stress: Stress Concern Present (03/14/2023)   [employer] of Occupational Health - Occupational Stress Questionnaire    Feeling of Stress : To some extent  Social Connections: Moderately Integrated (03/14/2023)   Social Connection and Isolation Panel [NHANES]    Frequency of Communication with Friends and Family: More than three times a week    Frequency of Social Gatherings with Friends and Family: Three times a week    Attends Religious Services: More than 4 times per year    Active Member of Clubs or Organizations: Yes    Attends Banker Meetings: More than 4 times per year    Marital Status: Divorced  Intimate Partner Violence: Not At Risk (01/14/2022)   Humiliation, Afraid, Rape, and Kick questionnaire    Fear of Current or Ex-Partner: No    Emotionally Abused: No    Physically Abused: No    Sexually Abused: No    Outpatient Medications Prior to Visit  Medication Sig Dispense Refill   amLODipine (NORVASC) 2.5 MG tablet Take 1 tablet (2.5 mg total) by mouth daily. 30 tablet 3   Atogepant (QULIPTA) 60 MG TABS Take 1 tablet by mouth daily. 30 tablet 12   cholecalciferol (VITAMIN D3) 25 MCG (1000 UNIT) tablet Take 1,000 Units by mouth daily.      entecavir (BARACLUDE) 0.5 MG tablet Take 1 tablet (0.5 mg total) by mouth daily. 90 tablet 3   ibandronate (BONIVA) 150 MG tablet TAKE (1) TABLET  ONCE A MONTH ONLY. TAKE WITH 6 TO 8 OZ OF WATER AND DO NOT LAY DOWN FOR 1 HOUR. 3 tablet 3   magnesium oxide (MAG-OX) 400 MG tablet Take 400 mg by mouth daily.     Multiple Vitamins-Minerals (HAIR/SKIN/NAILS/BIOTIN PO) Take 1 each by mouth in the morning, at noon, and at bedtime.     mupirocin ointment (BACTROBAN) 2 % APPLY 1 APPLICATION TOPICALLY TWICE DAILY. 22 g 0   RELPAX 40 MG tablet May repeat in 2 hours if headache persists or recurs. 12 tablet 11   tretinoin (RETIN-A) 0.1 % cream Apply 1 application topically at bedtime as needed. Patient states that she uses sporadic     zolpidem (AMBIEN) 5 MG tablet TAKE ONE TABLET BY MOUTH AT BEDTIME AS NEEDED FOR SLEEP. 30 tablet 0   No facility-administered medications prior to visit.    Allergies  Allergen Reactions   Codeine Nausea And Vomiting and Other (See Comments)    ROS Review of Systems  Constitutional:  Negative for chills and fever.  HENT:  Positive for hearing loss. Negative for congestion, postnasal drip, rhinorrhea, sinus pressure, sinus pain and sore throat.   Eyes:  Negative for pain and discharge.  Respiratory:  Negative for cough and shortness of breath.   Cardiovascular:  Negative for chest pain and palpitations.  Gastrointestinal:  Negative for abdominal pain, diarrhea, nausea and vomiting.  Endocrine: Negative for polydipsia and polyuria.  Genitourinary:  Negative for dysuria and hematuria.  Musculoskeletal:  Positive for arthralgias (L knee), back pain and joint swelling. Negative for neck pain and neck stiffness.       Left upper arm pain  Skin:  Negative for rash.  Neurological:  Negative for dizziness, seizures, syncope and weakness.  Psychiatric/Behavioral:  Negative for agitation and behavioral problems.       Objective:    Physical Exam Vitals reviewed.   Constitutional:      General: She is not in acute distress.    Appearance: She is not diaphoretic.  HENT:     Head: Normocephalic and atraumatic.     Nose: Nose normal. No congestion.     Mouth/Throat:     Mouth: Mucous membranes are moist.     Pharynx: No posterior oropharyngeal erythema.  Eyes:     General: No scleral icterus.    Extraocular Movements: Extraocular movements intact.  Neck:     Vascular: No carotid bruit.  Cardiovascular:     Rate and Rhythm: Normal rate and regular rhythm.     Pulses: Normal pulses.     Heart sounds: Normal heart sounds. No murmur heard. Pulmonary:     Breath sounds: Normal breath sounds. No wheezing or rales.  Musculoskeletal:     Left upper arm: Tenderness (Over deltoid area) present.     Right hand: Swelling (Mild PIP and DIP joints) present.     Left hand: Swelling (Mild PIP and DIP joints) present.     Cervical back: Neck supple. No tenderness.  Skin:    General: Skin is warm.     Findings: No rash.  Neurological:     General: No focal deficit present.     Mental Status: She is alert and oriented to person, place, and time.     Sensory: No sensory deficit.     Motor: No weakness.  Psychiatric:        Mood and Affect: Mood normal.        Behavior: Behavior normal.     BP  138/78 (BP Location: Left Arm)   Pulse 66   Ht 5\' 1"  (1.549 m)   Wt 150 lb 9.6 oz (68.3 kg)   SpO2 98%   BMI 28.46 kg/m  Wt Readings from Last 3 Encounters:  06/09/23 150 lb 9.6 oz (68.3 kg)  05/09/23 154 lb 8 oz (70.1 kg)  03/15/23 156 lb 12.8 oz (71.1 kg)    Lab Results  Component Value Date   TSH 1.760 03/17/2022   Lab Results  Component Value Date   WBC 5.8 05/10/2023   HGB 15.7 05/10/2023   HCT 46.6 05/10/2023   MCV 92 05/10/2023   PLT 289 05/10/2023   Lab Results  Component Value Date   NA 140 05/10/2023   K 5.0 05/10/2023   CO2 26 05/10/2023   GLUCOSE 89 05/10/2023   BUN 19 05/10/2023   CREATININE 0.83 05/10/2023   BILITOT 0.5  05/10/2023   ALKPHOS 69 05/10/2023   AST 27 05/10/2023   ALT 19 05/10/2023   PROT 6.8 05/10/2023   ALBUMIN 4.6 05/10/2023   CALCIUM 9.7 05/10/2023   EGFR 74 05/10/2023   Lab Results  Component Value Date   CHOL 225 (H) 10/01/2022   Lab Results  Component Value Date   HDL 77 10/01/2022   Lab Results  Component Value Date   LDLCALC 129 (H) 10/01/2022   Lab Results  Component Value Date   TRIG 110 10/01/2022   Lab Results  Component Value Date   CHOLHDL 2.9 10/01/2022   Lab Results  Component Value Date   HGBA1C 5.1 03/17/2022      Assessment & Plan:   Problem List Items Addressed This Visit       Cardiovascular and Mediastinum   Primary hypertension    BP Readings from Last 1 Encounters:  06/09/23 138/78  Was Well controlled with amlodipine 2.5 mg every day, needs to start taking again Had component of white coat hypertension Advised DASH diet and moderate exercise/walking, at least 150 mins/week      Relevant Orders   TSH   CMP14+EGFR   CBC with Differential/Platelet     Musculoskeletal and Integument   Deltoid tendonitis of left shoulder - Primary    Her left upper arm pain/tenderness is likely due to deltoid tendinitis Continue applying ice Diclofenac gel as needed for pain Flexeril as needed for muscle stiffness or spasms      Relevant Medications   cyclobenzaprine (FLEXERIL) 5 MG tablet   diclofenac Sodium (VOLTAREN) 1 % GEL     Other   Hyperlipidemia    Advised to follow low-cholesterol diet.      Relevant Orders   Lipid panel   Vitamin D deficiency    Last vitamin D Lab Results  Component Value Date   VD25OH 38.7 03/17/2022  Takes vitamin D supplements      Relevant Orders   VITAMIN D 25 Hydroxy (Vit-D Deficiency, Fractures)   Overweight (BMI 25.0-29.9)    BMI Readings from Last 3 Encounters:  06/09/23 28.46 kg/m  05/09/23 29.19 kg/m  03/15/23 29.63 kg/m  Advised to continue low-carb diet and ambulate as tolerated She is  going to exercise program at local [facility]      Insomnia    Takes Ambien 5 mg nightly as needed      Other Visit Diagnoses     Hyperglycemia       Relevant Orders   Hemoglobin A1c      Meds ordered this encounter  Medications  cyclobenzaprine (FLEXERIL) 5 MG tablet    Sig: Take 1 tablet (5 mg total) by mouth 2 (two) times daily as needed for muscle spasms.    Dispense:  30 tablet    Refill:  1   diclofenac Sodium (VOLTAREN) 1 % GEL    Sig: Apply 4 g topically 4 (four) times daily.    Dispense:  100 g    Refill:  2    Follow-up: Return in about 4 months (around 10/09/2023) for Annual physical.    Anabel Halon, MD

## 2023-06-09 NOTE — Assessment & Plan Note (Signed)
BP Readings from Last 1 Encounters:  06/09/23 138/78   Was Well controlled with amlodipine 2.5 mg every day, needs to start taking again Had component of white coat hypertension Advised DASH diet and moderate exercise/walking, at least 150 mins/week

## 2023-06-09 NOTE — Assessment & Plan Note (Signed)
Takes Ambien 5 mg nightly as needed 

## 2023-06-09 NOTE — Assessment & Plan Note (Signed)
Her left upper arm pain/tenderness is likely due to deltoid tendinitis Continue applying ice Diclofenac gel as needed for pain Flexeril as needed for muscle stiffness or spasms

## 2023-06-09 NOTE — Assessment & Plan Note (Signed)
Advised to follow low cholesterol diet 

## 2023-06-09 NOTE — Patient Instructions (Addendum)
Please apply ice and take Tylenol as needed for pain.  Okay to apply Voltaren gel for pain.  Please take Flexeril as needed for muscle stiffness or spasms.  Please start taking Amlodipine again.  Please continue to take other medications as prescribed.  Please continue to follow low salt diet and perform moderate exercise/walking at least 150 mins/week.  Please get fasting blood tests done before the next visit.

## 2023-06-14 ENCOUNTER — Ambulatory Visit: Payer: PPO | Admitting: Internal Medicine

## 2023-06-16 DIAGNOSIS — L820 Inflamed seborrheic keratosis: Secondary | ICD-10-CM | POA: Diagnosis not present

## 2023-06-25 ENCOUNTER — Other Ambulatory Visit: Payer: Self-pay | Admitting: Internal Medicine

## 2023-06-25 DIAGNOSIS — I10 Essential (primary) hypertension: Secondary | ICD-10-CM

## 2023-07-16 ENCOUNTER — Other Ambulatory Visit: Payer: Self-pay | Admitting: Internal Medicine

## 2023-07-16 DIAGNOSIS — F19982 Other psychoactive substance use, unspecified with psychoactive substance-induced sleep disorder: Secondary | ICD-10-CM

## 2023-07-18 ENCOUNTER — Other Ambulatory Visit: Payer: Self-pay | Admitting: Internal Medicine

## 2023-07-18 DIAGNOSIS — F19982 Other psychoactive substance use, unspecified with psychoactive substance-induced sleep disorder: Secondary | ICD-10-CM

## 2023-07-19 ENCOUNTER — Ambulatory Visit: Payer: PPO | Admitting: Internal Medicine

## 2023-07-20 ENCOUNTER — Ambulatory Visit (INDEPENDENT_AMBULATORY_CARE_PROVIDER_SITE_OTHER): Payer: PPO | Admitting: Internal Medicine

## 2023-07-20 ENCOUNTER — Encounter: Payer: Self-pay | Admitting: Internal Medicine

## 2023-07-20 VITALS — BP 134/76 | HR 82 | Ht 61.0 in | Wt 144.4 lb

## 2023-07-20 DIAGNOSIS — M778 Other enthesopathies, not elsewhere classified: Secondary | ICD-10-CM | POA: Diagnosis not present

## 2023-07-20 DIAGNOSIS — I10 Essential (primary) hypertension: Secondary | ICD-10-CM

## 2023-07-20 MED ORDER — MELOXICAM 7.5 MG PO TABS
7.5000 mg | ORAL_TABLET | Freq: Every day | ORAL | 1 refills | Status: AC
Start: 2023-07-20 — End: ?

## 2023-07-20 MED ORDER — AMLODIPINE BESYLATE 2.5 MG PO TABS: 2.5000 mg | ORAL_TABLET | Freq: Every day | ORAL | 1 refills | Status: DC

## 2023-07-20 NOTE — Patient Instructions (Signed)
Please take Meloxicam as prescribed for shoulder pain.  Please avoid heavy lifting on the left arm.  Okay to apply Diclofenac gel as needed for local pain.

## 2023-07-20 NOTE — Assessment & Plan Note (Signed)
Her left upper arm pain/tenderness is likely due to deltoid tendinitis Added Mobic as needed for left upper arm pain Continue applying ice Diclofenac gel as needed for pain Flexeril as needed for muscle stiffness or spasms - can try taking half tablet as needed for muscle stiffness Referred to [facility]

## 2023-07-20 NOTE — Progress Notes (Signed)
Acute Office Visit  Subjective:    Patient ID: Alyssa Durham, female    DOB: 08-11-1951, 72 y.o.   MRN: [id number]  Chief Complaint  Patient presents with   Shoulder Pain    Patient is having left shoulder pain     HPI Patient is in today for c/o persistent left shoulder pain.  She reports left upper arm pain for the last 6 weeks - since 06/02/23.  [facility] to senior center for [facility] and tried to do some lifting, that caused soreness of the left upper arm area after 2 days of the exercise.  She has worsening of the pain upon abduction, around 90 degrees.  Denies any direct impact injury.  Denies any numbness or tingling of the UE.  She has to lift her dog on the left arm every day, which causes aggravation of the pain.  She has tried taking Flexeril, but caused drowsiness.  She has been applying diclofenac gel for local pain relief as well with mild relief.    Past Medical History:  Diagnosis Date   Allergy    Anxiety and depression    Arthritis    Asthma    Cataract    Chronic active type B viral hepatitis (HCC)    Depression    History of HPV infection 11/15/2017   HLD (hyperlipidemia) 05/12/2017   Localized swelling of left lower leg 01/01/2021   Migraines    Osteoporosis    Primary hypertension 01/01/2021    Past Surgical History:  Procedure Laterality Date   CESAREAN SECTION     x3   EYE SURGERY  2019 and 2020   cataracts   KNEE SURGERY  2023   LIVER BIOPSY  1993   URETHRAL DILATION     WISDOM TOOTH EXTRACTION      Family History  Problem Relation Age of Onset   Uterine cancer Mother    Cancer Mother    Hearing loss Mother    Vision loss Mother    Migraines Father    Early death Father 66       suicide    Transient ischemic attack Father        multiple    Stroke Father    Migraines Brother    Healthy Daughter    Drug abuse Son        addict - stable on suboxone   Hepatitis B Son    Healthy Son    Cancer Maternal Grandmother         breast   Alcohol abuse Paternal Grandfather    Early death Paternal Aunt        suicide   Mental illness Paternal Aunt    Depression Paternal Aunt     Social History   Socioeconomic History   Marital status: Divorced    Spouse name: Not on file   Number of children: 3   Years of education: 14   Highest education level: Not on file  Occupational History   Occupation: retired    Comment: Advertising account executive  Tobacco Use   Smoking status: Former    Current packs/day: 0.00    Average packs/day: 2.0 packs/day for 5.0 years (10.0 ttl pk-yrs)    Types: Cigarettes    Start date: 12/21/1967    Quit date: 12/20/1972    Years since quitting: 50.6    Passive exposure: Past   Smokeless tobacco: Never   Tobacco comments:    I began smoking when I went  to college. 1969 - 1974  Vaping Use   Vaping status: Never Used  Substance and Sexual Activity   Alcohol use: No   Drug use: No   Sexual activity: Not Currently  Other Topics Concern   Not on file  Social History Narrative   Retired Environmental health practitioner   Three children  [place], [place] and [employer] - 2   Right handed   College   Social Determinants of Health   Financial Resource Strain: Low Risk  (03/14/2023)   Overall Financial Resource Strain (CARDIA)    Difficulty of Paying Living Expenses: Not very hard  Recent Concern: Financial Resource Strain - Medium Risk (01/18/2023)   Overall Financial Resource Strain (CARDIA)    Difficulty of Paying Living Expenses: Somewhat hard  Food Insecurity: No Food Insecurity (03/14/2023)   Hunger Vital Sign    Worried About Running Out of Food in the Last Year: Never true    Ran Out of Food in the Last Year: Never true  Transportation Needs: No Transportation Needs (03/14/2023)   PRAPARE - Administrator, Civil Service (Medical): No    Lack of Transportation (Non-Medical): No  Physical Activity: Inactive (03/14/2023)   Exercise Vital Sign    Days of Exercise per  Week: 0 days    Minutes of Exercise per Session: 0 min  Stress: Stress Concern Present (03/14/2023)   [employer] of Occupational Health - Occupational Stress Questionnaire    Feeling of Stress : To some extent  Social Connections: Moderately Integrated (03/14/2023)   Social Connection and Isolation Panel [NHANES]    Frequency of Communication with Friends and Family: More than three times a week    Frequency of Social Gatherings with Friends and Family: Three times a week    Attends Religious Services: More than 4 times per year    Active Member of Clubs or Organizations: Yes    Attends Banker Meetings: More than 4 times per year    Marital Status: Divorced  Intimate Partner Violence: Not At Risk (01/14/2022)   Humiliation, Afraid, Rape, and Kick questionnaire    Fear of Current or Ex-Partner: No    Emotionally Abused: No    Physically Abused: No    Sexually Abused: No    Outpatient Medications Prior to Visit  Medication Sig Dispense Refill   Atogepant (QULIPTA) 60 MG TABS Take 1 tablet by mouth daily. 30 tablet 12   cholecalciferol (VITAMIN D3) 25 MCG (1000 UNIT) tablet Take 1,000 Units by mouth daily.     cyclobenzaprine (FLEXERIL) 5 MG tablet Take 1 tablet (5 mg total) by mouth 2 (two) times daily as needed for muscle spasms. 30 tablet 1   diclofenac Sodium (VOLTAREN) 1 % GEL Apply 4 g topically 4 (four) times daily. 100 g 2   entecavir (BARACLUDE) 0.5 MG tablet Take 1 tablet (0.5 mg total) by mouth daily. 90 tablet 3   ibandronate (BONIVA) 150 MG tablet TAKE (1) TABLET ONCE A MONTH ONLY. TAKE WITH 6 TO 8 OZ OF WATER AND DO NOT LAY DOWN FOR 1 HOUR. 3 tablet 3   magnesium oxide (MAG-OX) 400 MG tablet Take 400 mg by mouth daily.     Multiple Vitamins-Minerals (HAIR/SKIN/NAILS/BIOTIN PO) Take 1 each by mouth in the morning, at noon, and at bedtime.     mupirocin ointment (BACTROBAN) 2 % APPLY 1 APPLICATION TOPICALLY TWICE DAILY. 22 g 0   RELPAX 40 MG tablet May  repeat in 2 hours  if headache persists or recurs. 12 tablet 11   tretinoin (RETIN-A) 0.1 % cream Apply 1 application topically at bedtime as needed. Patient states that she uses sporadic     zolpidem (AMBIEN) 5 MG tablet TAKE ONE TABLET BY MOUTH AT BEDTIME AS NEEDED FOR SLEEP. 30 tablet 1   amLODipine (NORVASC) 2.5 MG tablet TAKE (1) TABLET BY MOUTH ONCE DAILY. 30 tablet 0   No facility-administered medications prior to visit.    Allergies  Allergen Reactions   Codeine Nausea And Vomiting and Other (See Comments)    Review of Systems  Constitutional:  Positive for fever. Negative for chills.  HENT:  Positive for hearing loss. Negative for congestion, postnasal drip, rhinorrhea, sinus pressure, sinus pain and sore throat.   Eyes:  Negative for pain and discharge.  Respiratory:  Negative for cough and shortness of breath.   Cardiovascular:  Negative for chest pain and palpitations.  Gastrointestinal:  Negative for abdominal pain, diarrhea, nausea and vomiting.  Endocrine: Negative for polydipsia and polyuria.  Genitourinary:  Negative for dysuria and hematuria.  Musculoskeletal:  Positive for arthralgias (L knee) and back pain. Negative for neck pain and neck stiffness.       Left upper arm pain  Skin:  Negative for rash.  Neurological:  Negative for dizziness, seizures, syncope and weakness.  Psychiatric/Behavioral:  Negative for agitation and behavioral problems.        Objective:    Physical Exam Vitals reviewed.  Constitutional:      General: She is not in acute distress.    Appearance: She is not diaphoretic.  HENT:     Head: Normocephalic and atraumatic.     Nose: Nose normal. No congestion.     Mouth/Throat:     Mouth: Mucous membranes are moist.     Pharynx: No posterior oropharyngeal erythema.  Eyes:     General: No scleral icterus.    Extraocular Movements: Extraocular movements intact.  Neck:     Vascular: No carotid bruit.  Cardiovascular:     Rate and  Rhythm: Normal rate and regular rhythm.     Pulses: Normal pulses.     Heart sounds: Normal heart sounds. No murmur heard. Pulmonary:     Breath sounds: Normal breath sounds. No wheezing or rales.  Musculoskeletal:     Left shoulder: No effusion or tenderness. Decreased range of motion (Up to 90 degrees due to pain).     Left upper arm: Tenderness (Over deltoid area) present.     Right hand: Swelling (Mild PIP and DIP joints) present.     Left hand: Swelling (Mild PIP and DIP joints) present.     Cervical back: Neck supple. No tenderness.  Skin:    General: Skin is warm.     Findings: No rash.  Neurological:     General: No focal deficit present.     Mental Status: She is alert and oriented to person, place, and time.     Sensory: No sensory deficit.     Motor: No weakness.  Psychiatric:        Mood and Affect: Mood normal.        Behavior: Behavior normal.     BP 134/76 (BP Location: Right Arm)   Pulse 82   Ht 5\' 1"  (1.549 m)   Wt 144 lb 6.4 oz (65.5 kg)   SpO2 97%   BMI 27.28 kg/m  Wt Readings from Last 3 Encounters:  07/20/23 144 lb 6.4 oz (65.5 kg)  06/09/23 150 lb 9.6 oz (68.3 kg)  05/09/23 154 lb 8 oz (70.1 kg)        Assessment & Plan:   Problem List Items Addressed This Visit       Cardiovascular and Mediastinum   Primary hypertension    BP Readings from Last 1 Encounters:  07/20/23 134/76   Well controlled with amlodipine 2.5 mg QD,refilled Had component of white coat hypertension Advised DASH diet and moderate exercise/walking, at least 150 mins/week      Relevant Medications   amLODipine (NORVASC) 2.5 MG tablet     Musculoskeletal and Integument   Deltoid tendonitis of left shoulder - Primary    Her left upper arm pain/tenderness is likely due to deltoid tendinitis Added Mobic as needed for left upper arm pain Continue applying ice Diclofenac gel as needed for pain Flexeril as needed for muscle stiffness or spasms - can try taking half tablet  as needed for muscle stiffness Referred to [facility]      Relevant Medications   meloxicam (MOBIC) 7.5 MG tablet   Other Relevant Orders   Ambulatory referral to Orthopedic Surgery     Meds ordered this encounter  Medications   meloxicam (MOBIC) 7.5 MG tablet    Sig: Take 1 tablet (7.5 mg total) by mouth daily.    Dispense:  30 tablet    Refill:  1   amLODipine (NORVASC) 2.5 MG tablet    Sig: Take 1 tablet (2.5 mg total) by mouth daily.    Dispense:  90 tablet    Refill:  1     Flem Enderle Concha Se, MD

## 2023-07-20 NOTE — Assessment & Plan Note (Signed)
BP Readings from Last 1 Encounters:  07/20/23 134/76   Well controlled with amlodipine 2.5 mg QD,refilled Had component of white coat hypertension Advised DASH diet and moderate exercise/walking, at least 150 mins/week

## 2023-07-25 DIAGNOSIS — M25512 Pain in left shoulder: Secondary | ICD-10-CM | POA: Diagnosis not present

## 2023-07-26 DIAGNOSIS — M2042 Other hammer toe(s) (acquired), left foot: Secondary | ICD-10-CM | POA: Diagnosis not present

## 2023-07-26 DIAGNOSIS — L84 Corns and callosities: Secondary | ICD-10-CM | POA: Diagnosis not present

## 2023-08-01 DIAGNOSIS — E559 Vitamin D deficiency, unspecified: Secondary | ICD-10-CM | POA: Diagnosis not present

## 2023-08-01 DIAGNOSIS — E782 Mixed hyperlipidemia: Secondary | ICD-10-CM | POA: Diagnosis not present

## 2023-08-01 DIAGNOSIS — R739 Hyperglycemia, unspecified: Secondary | ICD-10-CM | POA: Diagnosis not present

## 2023-08-01 DIAGNOSIS — I10 Essential (primary) hypertension: Secondary | ICD-10-CM | POA: Diagnosis not present

## 2023-08-12 DIAGNOSIS — M25512 Pain in left shoulder: Secondary | ICD-10-CM | POA: Diagnosis not present

## 2023-08-19 DIAGNOSIS — M25512 Pain in left shoulder: Secondary | ICD-10-CM | POA: Diagnosis not present

## 2023-08-30 DIAGNOSIS — M7502 Adhesive capsulitis of left shoulder: Secondary | ICD-10-CM | POA: Diagnosis not present

## 2023-08-30 DIAGNOSIS — M75112 Incomplete rotator cuff tear or rupture of left shoulder, not specified as traumatic: Secondary | ICD-10-CM | POA: Diagnosis not present

## 2023-09-20 ENCOUNTER — Encounter: Payer: Self-pay | Admitting: Neurology

## 2023-09-20 ENCOUNTER — Ambulatory Visit: Payer: PPO | Admitting: Neurology

## 2023-09-20 VITALS — BP 132/84 | HR 45 | Ht 62.0 in | Wt 137.0 lb

## 2023-09-20 DIAGNOSIS — G43709 Chronic migraine without aura, not intractable, without status migrainosus: Secondary | ICD-10-CM | POA: Diagnosis not present

## 2023-09-20 MED ORDER — RELPAX 40 MG PO TABS: ORAL_TABLET | ORAL | 11 refills | Status: DC

## 2023-09-20 MED ORDER — QULIPTA 60 MG PO TABS: 1.0000 | ORAL_TABLET | Freq: Every day | ORAL | 12 refills | Status: DC

## 2023-09-20 NOTE — Progress Notes (Signed)
NEUROLOGY FOLLOW UP OFFICE NOTE  Alyssa Durham [id number] [birth year]  HISTORY OF PRESENT ILLNESS: I had the pleasure of seeing Alyssa Durham in follow-up in the neurology clinic on 09/20/2023.  The patient was last seen a year ago for chronic migraine. She is alone in the office today. Records and images were personally reviewed where available.  Since her last visit, she reports a lot of stressful life events with her dog passing, then her mother passing away last month. She has had a contentious relationship with her brother. She sees a Camera operator. The increase in stress the past few months have caused an increase in migraines. She is on Qulipta 60mg  1/2 tablet twice a day and has been almost migraine-free on this medication since March 2022. She would sometimes wake up with a twinge and take 1/2 Relpax with good effect. For the past couple of weeks, she has been having more migraines off and on, then last week she had one lasting 3 days that she took so much Relpax it caused side effects of chest tightness and difficulty breathing. No dizziness, vision changes, focal numbness/tingling. She has some left arm pain/weakness. She sleeps well with Ambien. Marland Kitchen    History on Initial Assessment 08/16/2016: This is a 72 yo RH woman with a history of chronic migraines, depression. She moved from [place] 2 months ago, records from her previous neurologist were reviewed. She has had migraines since 1969. Migraines now are mostly localized over the right hemisphere, if she closes her eyes, she sees flashing lights, but no prior aura. This is associated with nausea, vomiting, photo and phonophobia. She has 4-5 migraine days a week, taking 1/2 to 2 tablets of Relpax at the onset, which does help ease progression of symptoms. For the past 4 years, she has had migraines upon awakening. Migraines were fairly well-controlled until 1993 when she was started on a trial of an unrecalled antidepressant which helped. In  December 2016, headaches worsened and she was started on Lexapro, which helped with headaches but caused weight gain and hair loss. She tried Celexa (weight gain), Zoloft (weight gain), Wellbutrin. She also tried Topamax but stopped due to risk for kidney stones. She recalls taking Inderal but cannot recall effects/side effects. Most recently, she was tried on nortriptyline, which did not help with the headaches, but helped her mood. She is now on Cymbalta for arthritis, which also helps with her mood. For rescue, she has tried sumatriptan, Maxalt, and Zomig, with no effect. Relpax and Frova helped the most, causing less palpitations. She has noticed stress to be a big trigger, as well a chocolates. She usually sleeps good. There is a family history of migraines in her father, brother, and niece. She had received 2 rounds of Botox injections for chronic migraine, the first one helped very well with much less use of Relpax. Last Botox was in April 2017 before her move.    She is also reporting memory loss. She had told her neurologist about this in 1994. Memory issues are more for long-term memory, she cannot recall what happened years ago with her family, which concerns her because she cannot recall her children growing up. Short-term memory is pretty good, she denies getting lost driving, no missed medications or bill payments. She denies any dizziness, diplopia, dysarthria, dysphagia, neck pain, bowel/bladder dysfunction. She has floaters in her left eye.    Diagnostic Data: MRI brain without contrast done 04/17/15 at [facility] in [place] did not show  any acute changes, there was subtle white matter hyperintensity in the posterior left occipital lobe. Compared to 2007 study, this is only minimally progressive. EEG done 04/18/15 in [place] was within normal limits. She had a liver MRI with incidental finding of syrinx on the thoracic cord. 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 No underlying mass, mass effect, or cord thinning. She is asymptomatic from this.   Prior Preventative Medications: Zoloft, Topamax, Inderal, nortriptyline, Cymbalta, Botox, Zonisamide, Aimovig Prior Rescue medications: Relpax, sumatriptan, maxalt, Zomig, Frova, Amerge (did help), Ubrelvy, Nurtec Has not tried: gabapentin, depakote  PAST MEDICAL HISTORY: Past Medical History:  Diagnosis Date   Allergy    Anxiety and depression    Arthritis    Asthma    Cataract    Chronic active type B viral hepatitis (HCC)    Depression    History of HPV infection 11/15/2017   HLD (hyperlipidemia) 05/12/2017   Localized swelling of left lower leg 01/01/2021   Migraines    Osteoporosis    Primary hypertension 01/01/2021    MEDICATIONS: Current Outpatient Medications on File Prior to Visit  Medication Sig Dispense Refill   amLODipine (NORVASC) 2.5 MG tablet Take 1 tablet (2.5 mg total) by mouth daily. 90 tablet 1   Atogepant (QULIPTA) 60 MG TABS Take 1 tablet by mouth daily. 30 tablet 12   cholecalciferol (VITAMIN D3) 25 MCG (1000 UNIT) tablet Take 1,000 Units by mouth daily.     diclofenac Sodium (VOLTAREN) 1 % GEL Apply 4 g topically 4 (four) times daily. 100 g 2   entecavir (BARACLUDE) 0.5 MG tablet Take 1 tablet (0.5 mg total) by mouth daily. 90 tablet 3   ibandronate (BONIVA) 150 MG tablet TAKE (1) TABLET ONCE A MONTH ONLY. TAKE WITH 6 TO 8 OZ OF WATER AND DO NOT LAY DOWN FOR 1 HOUR. 3 tablet 3   Multiple Vitamins-Minerals (HAIR/SKIN/NAILS/BIOTIN PO) Take 1 each by mouth daily.     mupirocin ointment (BACTROBAN) 2 % APPLY 1 APPLICATION TOPICALLY TWICE DAILY. 22 g 0   RELPAX 40 MG tablet May repeat in 2 hours if headache persists or recurs. 12 tablet 11   tretinoin (RETIN-A) 0.1 % cream Apply 1 application topically at bedtime as needed. Patient states that she uses sporadic     zolpidem  (AMBIEN) 5 MG tablet TAKE ONE TABLET BY MOUTH AT BEDTIME AS NEEDED FOR SLEEP. 30 tablet 1   No current facility-administered medications on file prior to visit.    ALLERGIES: Allergies  Allergen Reactions   Codeine Nausea And Vomiting and Other (See Comments)    FAMILY HISTORY: Family History  Problem Relation Age of Onset   Uterine cancer Mother    Cancer Mother    Hearing loss Mother    Vision loss Mother    Migraines Father    Early death Father 70       suicide    Transient ischemic attack Father        multiple    Stroke Father    Migraines Brother    Healthy Daughter    Drug abuse Son        addict - stable on suboxone   Hepatitis B Son    Healthy Son    Cancer Maternal Grandmother        breast   Alcohol abuse Paternal Grandfather    Early death Paternal Aunt  suicide   Mental illness Paternal Aunt    Depression Paternal Aunt     SOCIAL HISTORY: Social History   Socioeconomic History   Marital status: Divorced    Spouse name: Not on file   Number of children: 3   Years of education: 14   Highest education level: Not on file  Occupational History   Occupation: retired    Comment: Advertising account executive  Tobacco Use   Smoking status: Former    Current packs/day: 0.00    Average packs/day: 2.0 packs/day for 5.0 years (10.0 ttl pk-yrs)    Types: Cigarettes    Start date: 12/21/1967    Quit date: 12/20/1972    Years since quitting: 50.7    Passive exposure: Past   Smokeless tobacco: Never   Tobacco comments:    I began smoking when I went to college. 1969 - 1974  Vaping Use   Vaping status: Never Used  Substance and Sexual Activity   Alcohol use: No   Drug use: No   Sexual activity: Not Currently  Other Topics Concern   Not on file  Social History Narrative   Retired Environmental health practitioner   Three children  [place], [place] and [employer] - 2   Right handed   College   Social Determinants of Health   Financial Resource  Strain: Low Risk  (03/14/2023)   Overall Financial Resource Strain (CARDIA)    Difficulty of Paying Living Expenses: Not very hard  Recent Concern: Financial Resource Strain - Medium Risk (01/18/2023)   Overall Financial Resource Strain (CARDIA)    Difficulty of Paying Living Expenses: Somewhat hard  Food Insecurity: No Food Insecurity (03/14/2023)   Hunger Vital Sign    Worried About Running Out of Food in the Last Year: Never true    Ran Out of Food in the Last Year: Never true  Transportation Needs: No Transportation Needs (03/14/2023)   PRAPARE - Administrator, Civil Service (Medical): No    Lack of Transportation (Non-Medical): No  Physical Activity: Inactive (03/14/2023)   Exercise Vital Sign    Days of Exercise per Week: 0 days    Minutes of Exercise per Session: 0 min  Stress: Stress Concern Present (03/14/2023)   [employer] of Occupational Health - Occupational Stress Questionnaire    Feeling of Stress : To some extent  Social Connections: Moderately Integrated (03/14/2023)   Social Connection and Isolation Panel [NHANES]    Frequency of Communication with Friends and Family: More than three times a week    Frequency of Social Gatherings with Friends and Family: Three times a week    Attends Religious Services: More than 4 times per year    Active Member of Clubs or Organizations: Yes    Attends Banker Meetings: More than 4 times per year    Marital Status: Divorced  Intimate Partner Violence: Not At Risk (01/14/2022)   Humiliation, Afraid, Rape, and Kick questionnaire    Fear of Current or Ex-Partner: No    Emotionally Abused: No    Physically Abused: No    Sexually Abused: No     PHYSICAL EXAM: Vitals:   09/20/23 1454  BP: 132/84  Pulse: (!) 45  SpO2: 97%   General: No acute distress Head:  Normocephalic/atraumatic Skin/Extremities: No rash, no edema Neurological Exam: alert and awake. No aphasia or dysarthria. Fund of knowledge is  appropriate.  Attention and concentration are normal.   Cranial nerves: Pupils equal, round. Extraocular  movements intact with no nystagmus. Visual fields full.  No facial asymmetry.  Motor: Bulk and tone normal, muscle strength 5/5 throughout with no pronator drift.   Finger to nose testing intact.  Gait narrow-based and steady, able to tandem walk adequately.  Romberg negative.   IMPRESSION: This is a 72 yo RH woman with chronic migraines who had significant improvement on Qulipta 60mg  daily. She has prn Relpax for rescue. Refills sent. She has had an increase in migraines recently due to stress, we discussed that if she has a migraine lasting 3 or more days, she can contact our office for a migraine cocktail or course of steroid to break headache cycle. She has been dealing with a lot of grief and stress, continue seeing grief counselor. Follow-up in 1 year, call for any changes.    Thank you for allowing me to participate in her care.  Please do not hesitate to call for any questions or concerns.    Patrcia Dolly, M.D.   CC: Dr. Allena Katz

## 2023-09-20 NOTE — Patient Instructions (Signed)
Good to see you. Wishing you all the best. Continue Qulipta and Relpax as needed. If migraines last 3 days or more, please contact our office and we can do either a migraine cocktail or a week course of steroid to break the headache. Follow-up in 1 year, call for any changes.

## 2023-09-28 ENCOUNTER — Other Ambulatory Visit: Payer: Self-pay | Admitting: Internal Medicine

## 2023-09-28 DIAGNOSIS — F19982 Other psychoactive substance use, unspecified with psychoactive substance-induced sleep disorder: Secondary | ICD-10-CM

## 2023-09-28 MED ORDER — ZOLPIDEM TARTRATE 5 MG PO TABS: 5.0000 mg | ORAL_TABLET | Freq: Every evening | ORAL | 2 refills | Status: DC | PRN

## 2023-10-03 ENCOUNTER — Encounter: Payer: Self-pay | Admitting: Internal Medicine

## 2023-10-04 ENCOUNTER — Ambulatory Visit: Payer: PPO | Admitting: Internal Medicine

## 2023-10-04 ENCOUNTER — Encounter: Payer: Self-pay | Admitting: Internal Medicine

## 2023-10-04 VITALS — BP 138/78 | HR 66 | Ht 61.0 in | Wt 136.4 lb

## 2023-10-04 DIAGNOSIS — G43819 Other migraine, intractable, without status migrainosus: Secondary | ICD-10-CM

## 2023-10-04 DIAGNOSIS — F4321 Adjustment disorder with depressed mood: Secondary | ICD-10-CM

## 2023-10-04 DIAGNOSIS — Z23 Encounter for immunization: Secondary | ICD-10-CM | POA: Diagnosis not present

## 2023-10-04 DIAGNOSIS — Z1211 Encounter for screening for malignant neoplasm of colon: Secondary | ICD-10-CM | POA: Diagnosis not present

## 2023-10-04 DIAGNOSIS — I10 Essential (primary) hypertension: Secondary | ICD-10-CM | POA: Diagnosis not present

## 2023-10-04 DIAGNOSIS — G4700 Insomnia, unspecified: Secondary | ICD-10-CM | POA: Diagnosis not present

## 2023-10-04 DIAGNOSIS — M7582 Other shoulder lesions, left shoulder: Secondary | ICD-10-CM | POA: Diagnosis not present

## 2023-10-04 DIAGNOSIS — E785 Hyperlipidemia, unspecified: Secondary | ICD-10-CM

## 2023-10-04 DIAGNOSIS — B181 Chronic viral hepatitis B without delta-agent: Secondary | ICD-10-CM

## 2023-10-04 DIAGNOSIS — Z0001 Encounter for general adult medical examination with abnormal findings: Secondary | ICD-10-CM

## 2023-10-04 DIAGNOSIS — F4320 Adjustment disorder, unspecified: Secondary | ICD-10-CM

## 2023-10-04 NOTE — Patient Instructions (Signed)
Please continue to take medications as prescribed.  Please continue to follow low carb diet and perform moderate exercise/walking as tolerated.  Please consider getting Tdap vaccine at local pharmacy.

## 2023-10-04 NOTE — Assessment & Plan Note (Signed)
On Baraclude Followed by GI

## 2023-10-04 NOTE — Progress Notes (Signed)
Established Patient Office Visit  Subjective:  Patient ID: Alyssa Durham, female    DOB: 08/20/1951  Age: 72 y.o. MRN: [id number]  CC:  Chief Complaint  Patient presents with   Hypertension    Four month follow up    Migraine    Four month follow up    Stress    Patient feels very stressed    Annual Exam    HPI [name] is a 72 y.o. female with past medical history of chronic migraine, mild intermittent asthma related to allergies, osteoporosis, chronic hep B, lichen sclerosus, and gluteal tendinitis of right buttock who presents for annual physical.  Patient follows up with neurology for chronic migraine and takes Relpax and Qulipta for it.  She was on Nurtec in the past.   She has history of osteoporosis, for which she takes [place].  She agrees to start taking Caltrate 600+ D3 now.  She has been stressed due to her mother's death in Sep 23, 2023.  She had conflicts with her brother regarding care of her mother.  She has been getting grief counseling through her mother's hospice team currently. She has chronic insomnia, for which she takes Ambien as needed.  Denies any SI or HI currently.   Past Medical History:  Diagnosis Date   Allergy    Anxiety and depression    Arthritis    Asthma    Cataract    Chronic active type B viral hepatitis (HCC)    Depression    History of HPV infection 11/15/2017   HLD (hyperlipidemia) 05/12/2017   Localized swelling of left lower leg 01/01/2021   Migraines    Osteoporosis    Primary hypertension 01/01/2021    Past Surgical History:  Procedure Laterality Date   CESAREAN SECTION     x3   EYE SURGERY  2019 and 2020   cataracts   KNEE SURGERY  2023   LIVER BIOPSY  1993   URETHRAL DILATION     WISDOM TOOTH EXTRACTION      Family History  Problem Relation Age of Onset   Uterine cancer Mother    Cancer Mother    Hearing loss Mother    Vision loss Mother    Migraines Father    Early death Father 108       suicide     Transient ischemic attack Father        multiple    Stroke Father    Migraines Brother    Healthy Daughter    Drug abuse Son        addict - stable on suboxone   Hepatitis B Son    Healthy Son    Cancer Maternal Grandmother        breast   Alcohol abuse Paternal Grandfather    Early death Paternal Aunt        suicide   Mental illness Paternal Aunt    Depression Paternal Aunt     Social History   Socioeconomic History   Marital status: Divorced    Spouse name: Not on file   Number of children: 3   Years of education: 14   Highest education level: Not on file  Occupational History   Occupation: retired    Comment: Advertising account executive  Tobacco Use   Smoking status: Former    Current packs/day: 0.00    Average packs/day: 2.0 packs/day for 5.0 years (10.0 ttl pk-yrs)    Types: Cigarettes    Start date: 12/21/1967  Quit date: 12/20/1972    Years since quitting: 50.8    Passive exposure: Past   Smokeless tobacco: Never   Tobacco comments:    I began smoking when I went to college. 1969 - 1974  Vaping Use   Vaping status: Never Used  Substance and Sexual Activity   Alcohol use: No   Drug use: No   Sexual activity: Not Currently  Other Topics Concern   Not on file  Social History Narrative   Retired Environmental health practitioner   Three children  [place], [place] and [employer] - 2   Right handed   College   Social Determinants of Health   Financial Resource Strain: Low Risk  (03/14/2023)   Overall Financial Resource Strain (CARDIA)    Difficulty of Paying Living Expenses: Not very hard  Recent Concern: Financial Resource Strain - Medium Risk (01/18/2023)   Overall Financial Resource Strain (CARDIA)    Difficulty of Paying Living Expenses: Somewhat hard  Food Insecurity: No Food Insecurity (03/14/2023)   Hunger Vital Sign    Worried About Running Out of Food in the Last Year: Never true    Ran Out of Food in the Last Year: Never true  Transportation Needs: No  Transportation Needs (03/14/2023)   PRAPARE - Administrator, Civil Service (Medical): No    Lack of Transportation (Non-Medical): No  Physical Activity: Inactive (03/14/2023)   Exercise Vital Sign    Days of Exercise per Week: 0 days    Minutes of Exercise per Session: 0 min  Stress: Stress Concern Present (03/14/2023)   [employer] of Occupational Health - Occupational Stress Questionnaire    Feeling of Stress : To some extent  Social Connections: Moderately Integrated (03/14/2023)   Social Connection and Isolation Panel [NHANES]    Frequency of Communication with Friends and Family: More than three times a week    Frequency of Social Gatherings with Friends and Family: Three times a week    Attends Religious Services: More than 4 times per year    Active Member of Clubs or Organizations: Yes    Attends Banker Meetings: More than 4 times per year    Marital Status: Divorced  Intimate Partner Violence: Not At Risk (01/14/2022)   Humiliation, Afraid, Rape, and Kick questionnaire    Fear of Current or Ex-Partner: No    Emotionally Abused: No    Physically Abused: No    Sexually Abused: No    Outpatient Medications Prior to Visit  Medication Sig Dispense Refill   amLODipine (NORVASC) 2.5 MG tablet Take 1 tablet (2.5 mg total) by mouth daily. 90 tablet 1   Atogepant (QULIPTA) 60 MG TABS Take 1 tablet (60 mg total) by mouth daily. 30 tablet 12   cholecalciferol (VITAMIN D3) 25 MCG (1000 UNIT) tablet Take 1,000 Units by mouth daily.     diclofenac Sodium (VOLTAREN) 1 % GEL Apply 4 g topically 4 (four) times daily. 100 g 2   entecavir (BARACLUDE) 0.5 MG tablet Take 1 tablet (0.5 mg total) by mouth daily. 90 tablet 3   ibandronate (BONIVA) 150 MG tablet TAKE (1) TABLET ONCE A MONTH ONLY. TAKE WITH 6 TO 8 OZ OF WATER AND DO NOT LAY DOWN FOR 1 HOUR. 3 tablet 3   Multiple Vitamins-Minerals (HAIR/SKIN/NAILS/BIOTIN PO) Take 1 each by mouth daily.     mupirocin  ointment (BACTROBAN) 2 % APPLY 1 APPLICATION TOPICALLY TWICE DAILY. 22 g 0   RELPAX 40 MG  tablet May repeat in 2 hours if headache persists or recurs. 12 tablet 11   tretinoin (RETIN-A) 0.1 % cream Apply 1 application topically at bedtime as needed. Patient states that she uses sporadic     zolpidem (AMBIEN) 5 MG tablet Take 1 tablet (5 mg total) by mouth at bedtime as needed. for sleep 30 tablet 2   No facility-administered medications prior to visit.    Allergies  Allergen Reactions   Codeine Nausea And Vomiting and Other (See Comments)    ROS Review of Systems  Constitutional:  Negative for chills and fever.  HENT:  Negative for congestion, postnasal drip, rhinorrhea, sinus pressure, sinus pain and sore throat.   Eyes:  Negative for pain and discharge.  Respiratory:  Negative for cough and shortness of breath.   Cardiovascular:  Negative for chest pain and palpitations.  Gastrointestinal:  Negative for abdominal pain, diarrhea, nausea and vomiting.  Endocrine: Negative for polydipsia and polyuria.  Genitourinary:  Negative for dysuria and hematuria.  Musculoskeletal:  Positive for arthralgias, back pain and joint swelling. Negative for neck pain and neck stiffness.  Skin:  Negative for rash.  Neurological:  Negative for dizziness, seizures, syncope and weakness.  Psychiatric/Behavioral:  Positive for dysphoric mood and sleep disturbance. Negative for agitation and behavioral problems. The patient is nervous/anxious.       Objective:    Physical Exam Vitals reviewed.  Constitutional:      General: She is not in acute distress.    Appearance: She is not diaphoretic.  HENT:     Head: Normocephalic and atraumatic.     Nose: Nose normal. No congestion.     Mouth/Throat:     Mouth: Mucous membranes are moist.     Pharynx: No posterior oropharyngeal erythema.  Eyes:     General: No scleral icterus.    Extraocular Movements: Extraocular movements intact.  Neck:     Vascular:  No carotid bruit.  Cardiovascular:     Rate and Rhythm: Normal rate and regular rhythm.     Pulses: Normal pulses.     Heart sounds: Normal heart sounds. No murmur heard. Pulmonary:     Breath sounds: Normal breath sounds. No wheezing or rales.  Abdominal:     Palpations: Abdomen is soft.     Tenderness: There is no abdominal tenderness.  Musculoskeletal:     Left shoulder: Tenderness present. Decreased range of motion.     Right hand: Swelling (Mild PIP and DIP joints) present.     Left hand: Swelling (Mild PIP and DIP joints) present.     Cervical back: Neck supple. No tenderness.     Left knee: Swelling (Mild) present. No effusion or erythema. Decreased range of motion.     Left lower leg: Swelling present.  Skin:    General: Skin is warm.     Findings: No rash.  Neurological:     General: No focal deficit present.     Mental Status: She is alert and oriented to person, place, and time.     Cranial Nerves: No cranial nerve deficit.     Sensory: No sensory deficit.     Motor: No weakness.  Psychiatric:        Mood and Affect: Mood normal.        Behavior: Behavior normal.     BP 138/78 (BP Location: Right Arm, Patient Position: Sitting, Cuff Size: Normal)   Pulse 66   Ht 5\' 1"  (1.549 m)   Wt 136 lb  6.4 oz (61.9 kg)   SpO2 98%   BMI 25.77 kg/m  Wt Readings from Last 3 Encounters:  10/04/23 136 lb 6.4 oz (61.9 kg)  09/20/23 137 lb (62.1 kg)  07/20/23 144 lb 6.4 oz (65.5 kg)    Lab Results  Component Value Date   TSH 2.530 08/01/2023   Lab Results  Component Value Date   WBC 4.5 08/01/2023   HGB 15.7 08/01/2023   HCT 46.2 08/01/2023   MCV 91 08/01/2023   PLT 252 08/01/2023   Lab Results  Component Value Date   NA 141 08/01/2023   K 5.1 08/01/2023   CO2 25 08/01/2023   GLUCOSE 84 08/01/2023   BUN 17 08/01/2023   CREATININE 0.89 08/01/2023   BILITOT 0.6 08/01/2023   ALKPHOS 63 08/01/2023   AST 23 08/01/2023   ALT 13 08/01/2023   PROT 6.3 08/01/2023    ALBUMIN 4.3 08/01/2023   CALCIUM 9.5 08/01/2023   EGFR 68 08/01/2023   Lab Results  Component Value Date   CHOL 211 (H) 08/01/2023   Lab Results  Component Value Date   HDL 75 08/01/2023   Lab Results  Component Value Date   LDLCALC 122 (H) 08/01/2023   Lab Results  Component Value Date   TRIG 79 08/01/2023   Lab Results  Component Value Date   CHOLHDL 2.8 08/01/2023   Lab Results  Component Value Date   HGBA1C 5.2 08/01/2023      Assessment & Plan:   Problem List Items Addressed This Visit       Cardiovascular and Mediastinum   Migraine    On Qulipta and Relpax Follows up with Neurology      Primary hypertension    BP Readings from Last 1 Encounters:  10/04/23 138/78   Well controlled with amlodipine 2.5 mg QD,refilled Had component of white coat hypertension Advised DASH diet and moderate exercise/walking, at least 150 mins/week        Digestive   Chronic hepatitis B (HCC)    On Baraclude Followed by GI        Musculoskeletal and Integument   Rotator cuff tendinitis, left    Her left upper arm pain/tenderness is likely due to rotator cuff tendinitis Has been evaluated by [facility] Diclofenac gel as needed for pain MRI of left shoulder reviewed - has subacromial impingement with mild rotator cuff tendinosis and low-grade interstitial perforation of the supraspinatus, mild acromioclavicular osteoarthritis and supraspinatus outlet stenosis, trace subacromial-subdeltoid bursitis        Other   Hyperlipidemia    Advised to follow low-cholesterol diet.      Encounter for general adult medical examination with abnormal findings - Primary    Physical exam as documented. Counseling done  re healthy lifestyle involving commitment to 150 minutes exercise per week, heart healthy diet, and attaining healthy weight.The importance of adequate sleep also discussed. Changes in health habits are decided on by the patient with goals and time frames  set for  achieving them. Immunization and cancer screening needs are specifically addressed at this visit.      Insomnia    Takes Ambien 5 mg nightly as needed      Grief reaction    Due to her mother's demise She is having anxiety due to her conflict with her brother Currently getting grief counseling through her mother's hospice care      Other Visit Diagnoses     Colon cancer screening       Relevant  Orders   Cologuard   Encounter for immunization       Relevant Orders   Flu Vaccine Trivalent High Dose (Fluad) (Completed)        No orders of the defined types were placed in this encounter.   Follow-up: Return in about 4 months (around 02/04/2024) for HTN.    Anabel Halon, MD

## 2023-10-04 NOTE — Assessment & Plan Note (Signed)
BP Readings from Last 1 Encounters:  10/04/23 138/78   Well controlled with amlodipine 2.5 mg QD,refilled Had component of white coat hypertension Advised DASH diet and moderate exercise/walking, at least 150 mins/week

## 2023-10-04 NOTE — Assessment & Plan Note (Signed)
Due to her mother's demise She is having anxiety due to her conflict with her brother Currently getting grief counseling through her mother's hospice care

## 2023-10-04 NOTE — Assessment & Plan Note (Signed)
Takes Ambien 5 mg nightly as needed

## 2023-10-04 NOTE — Assessment & Plan Note (Signed)

## 2023-10-04 NOTE — Assessment & Plan Note (Signed)
On Qulipta and Relpax Follows up with Neurology

## 2023-10-04 NOTE — Assessment & Plan Note (Signed)
Advised to follow low cholesterol diet

## 2023-10-04 NOTE — Assessment & Plan Note (Signed)
Her left upper arm pain/tenderness is likely due to rotator cuff tendinitis Has been evaluated by [facility] Diclofenac gel as needed for pain MRI of left shoulder reviewed - has subacromial impingement with mild rotator cuff tendinosis and low-grade interstitial perforation of the supraspinatus, mild acromioclavicular osteoarthritis and supraspinatus outlet stenosis, trace subacromial-subdeltoid bursitis

## 2023-10-12 ENCOUNTER — Ambulatory Visit (INDEPENDENT_AMBULATORY_CARE_PROVIDER_SITE_OTHER): Payer: PPO | Admitting: Family Medicine

## 2023-10-12 VITALS — BP 122/82 | HR 102 | Ht 61.0 in | Wt 137.0 lb

## 2023-10-12 DIAGNOSIS — B349 Viral infection, unspecified: Secondary | ICD-10-CM | POA: Diagnosis not present

## 2023-10-12 MED ORDER — PREDNISONE 20 MG PO TABS
40.0000 mg | ORAL_TABLET | Freq: Every day | ORAL | 0 refills | Status: AC
Start: 2023-10-12 — End: 2023-10-17

## 2023-10-12 MED ORDER — AZITHROMYCIN 250 MG PO TABS
ORAL_TABLET | ORAL | 0 refills | Status: AC
Start: 2023-10-12 — End: 2023-10-17

## 2023-10-12 MED ORDER — PROMETHAZINE-DM 6.25-15 MG/5ML PO SYRP
5.0000 mL | ORAL_SOLUTION | Freq: Four times a day (QID) | ORAL | 0 refills | Status: DC | PRN
Start: 2023-10-12 — End: 2023-11-14

## 2023-10-12 NOTE — Patient Instructions (Addendum)
I appreciate the opportunity to provide care to you today!     -Start taking prednisone 40 mg daily for 5 days -Complete the full course of azithromycin -A prescription for Promethazine DM for cough every 4 hours is sent to your pharmacy Here are some nonpharmacological management strategies for dry cough: - Hydration: Drink plenty of water to help soothe the throat and keep it moist, which can reduce irritation that may trigger coughing. Warm liquids, such as herbal teas, broths, or warm water with honey and lemon, can be especially soothing. -Honey: Taking a spoonful of honey or adding honey to warm tea may help coat the throat and alleviate dry cough. Honey has been shown to reduce cough frequency and severity. -Humidifier: Using a humidifier in the room, especially at night, can add moisture to the air, which helps reduce throat irritation and soothes the airways. -Steam Inhalation: Inhaling steam from a bowl of hot water or during a hot shower can help loosen mucus and reduce throat irritation, making breathing easier and soothing the cough reflex. -Saltwater Gargle: Gargling with warm salt water can help reduce throat irritation and inflammation, which may help decrease the urge to cough. -Avoid Irritants: Avoiding irritants such as smoke, strong fragrances, and cold air can prevent further irritation of the throat and respiratory system. If possible, avoid exposure to allergens and air pollution. - Elevate the Head While Sleeping: Keeping the head elevated with extra pillows can reduce postnasal drip and minimize coughing, especially at night. -Throat Lozenges or Herbal Teas: Sucking on throat lozenges or drinking herbal teas with soothing ingredients like slippery elm, licorice root, or marshmallow root can help coat the throat and reduce coughing. - Breathing Exercises: Practicing deep breathing or controlled breathing exercises can help calm the airways and prevent the urge to  cough.    Please continue to a heart-healthy diet and increase your physical activities. Try to exercise for at least five days a week.    It was a pleasure to see you and I look forward to continuing to work together on your health and well-being. Please do not hesitate to call the office if you need care or have questions about your care.  In case of emergency, please visit the Emergency Department for urgent care, or contact our clinic at [phone] to schedule an appointment. We're here to help you!   Have a wonderful day and week. With Gratitude, Gilmore Laroche MSN, FNP-BC

## 2023-10-12 NOTE — Assessment & Plan Note (Signed)
The patient reported testing negative for COVID at the onset of her symptoms. She will be treated today with prednisone for 5 days. Azithromycin has been provided for anaerobic coverage, and Promethazine DM for cough and cold symptoms.  The patient is advised to:  Take all medications as prescribed. Increase fluid intake and allow for plenty of rest. Use Tylenol as needed for pain, fever, or general discomfort. Perform warm saltwater gargles 3-4 times daily to help with throat pain or discomfort. Use a humidifier at bedtime to help with cough and nasal congestion. The patient is encouraged to follow up if symptoms do not improve.

## 2023-10-12 NOTE — Progress Notes (Signed)
Acute Office Visit  Subjective:    Patient ID: Alyssa Durham, female    DOB: Aug 12, 1951, 72 y.o.   MRN: [id number]  Chief Complaint  Patient presents with   Cough    Pt reports dry cough , losing voice. Started on 10/09/2023 with cough. Went and had covid immunization on 10/10/23.     HPI The patient presents today with complaints of a dry, persistent cough that has worsened at nighttime since 10/09/2023. She denies fever, chills, night sweats, or unintentional weight loss. She notes that in the past, similar symptoms have progressed into bronchitis.  Past Medical History:  Diagnosis Date   Allergy    Anxiety and depression    Arthritis    Asthma    Cataract    Chronic active type B viral hepatitis (HCC)    Depression    History of HPV infection 11/15/2017   HLD (hyperlipidemia) 05/12/2017   Localized swelling of left lower leg 01/01/2021   Migraines    Osteoporosis    Primary hypertension 01/01/2021    Past Surgical History:  Procedure Laterality Date   CESAREAN SECTION     x3   EYE SURGERY  2019 and 2020   cataracts   KNEE SURGERY  2023   LIVER BIOPSY  1993   URETHRAL DILATION     WISDOM TOOTH EXTRACTION      Family History  Problem Relation Age of Onset   Uterine cancer Mother    Cancer Mother    Hearing loss Mother    Vision loss Mother    Migraines Father    Early death Father 40       suicide    Transient ischemic attack Father        multiple    Stroke Father    Migraines Brother    Healthy Daughter    Drug abuse Son        addict - stable on suboxone   Hepatitis B Son    Healthy Son    Cancer Maternal Grandmother        breast   Alcohol abuse Paternal Grandfather    Early death Paternal Aunt        suicide   Mental illness Paternal Aunt    Depression Paternal Aunt     Social History   Socioeconomic History   Marital status: Divorced    Spouse name: Not on file   Number of children: 3   Years of education: 14   Highest  education level: Bachelor's degree (e.g., BA, AB, BS)  Occupational History   Occupation: retired    Comment: Advertising account executive  Tobacco Use   Smoking status: Former    Current packs/day: 0.00    Average packs/day: 2.0 packs/day for 5.0 years (10.0 ttl pk-yrs)    Types: Cigarettes    Start date: 12/21/1967    Quit date: 12/20/1972    Years since quitting: 50.8    Passive exposure: Past   Smokeless tobacco: Never   Tobacco comments:    I began smoking when I went to college. 1969 - 1974  Vaping Use   Vaping status: Never Used  Substance and Sexual Activity   Alcohol use: No   Drug use: No   Sexual activity: Not Currently  Other Topics Concern   Not on file  Social History Narrative   Retired Environmental health practitioner   Three children  [place] - 2   Right handed  College   Social Determinants of Health   Financial Resource Strain: Medium Risk (10/12/2023)   Overall Financial Resource Strain (CARDIA)    Difficulty of Paying Living Expenses: Somewhat hard  Food Insecurity: Food Insecurity Present (10/12/2023)   Hunger Vital Sign    Worried About Running Out of Food in the Last Year: Sometimes true    Ran Out of Food in the Last Year: Sometimes true  Transportation Needs: No Transportation Needs (10/12/2023)   PRAPARE - Administrator, Civil Service (Medical): No    Lack of Transportation (Non-Medical): No  Physical Activity: Insufficiently Active (10/12/2023)   Exercise Vital Sign    Days of Exercise per Week: 2 days    Minutes of Exercise per Session: 20 min  Stress: No Stress Concern Present (10/12/2023)   [employer] of Occupational Health - Occupational Stress Questionnaire    Feeling of Stress : Only a little  Social Connections: Moderately Integrated (10/12/2023)   Social Connection and Isolation Panel [NHANES]    Frequency of Communication with Friends and Family: Never    Frequency of Social Gatherings with  Friends and Family: Three times a week    Attends Religious Services: More than 4 times per year    Active Member of Clubs or Organizations: Yes    Attends Banker Meetings: More than 4 times per year    Marital Status: Divorced  Intimate Partner Violence: Not At Risk (01/14/2022)   Humiliation, Afraid, Rape, and Kick questionnaire    Fear of Current or Ex-Partner: No    Emotionally Abused: No    Physically Abused: No    Sexually Abused: No    Outpatient Medications Prior to Visit  Medication Sig Dispense Refill   amLODipine (NORVASC) 2.5 MG tablet Take 1 tablet (2.5 mg total) by mouth daily. 90 tablet 1   Atogepant (QULIPTA) 60 MG TABS Take 1 tablet (60 mg total) by mouth daily. 30 tablet 12   cholecalciferol (VITAMIN D3) 25 MCG (1000 UNIT) tablet Take 1,000 Units by mouth daily.     diclofenac Sodium (VOLTAREN) 1 % GEL Apply 4 g topically 4 (four) times daily. 100 g 2   entecavir (BARACLUDE) 0.5 MG tablet Take 1 tablet (0.5 mg total) by mouth daily. 90 tablet 3   ibandronate (BONIVA) 150 MG tablet TAKE (1) TABLET ONCE A MONTH ONLY. TAKE WITH 6 TO 8 OZ OF WATER AND DO NOT LAY DOWN FOR 1 HOUR. 3 tablet 3   Multiple Vitamins-Minerals (HAIR/SKIN/NAILS/BIOTIN PO) Take 1 each by mouth daily.     mupirocin ointment (BACTROBAN) 2 % APPLY 1 APPLICATION TOPICALLY TWICE DAILY. 22 g 0   RELPAX 40 MG tablet May repeat in 2 hours if headache persists or recurs. 12 tablet 11   tretinoin (RETIN-A) 0.1 % cream Apply 1 application topically at bedtime as needed. Patient states that she uses sporadic     zolpidem (AMBIEN) 5 MG tablet Take 1 tablet (5 mg total) by mouth at bedtime as needed. for sleep 30 tablet 2   No facility-administered medications prior to visit.    Allergies  Allergen Reactions   Codeine Nausea And Vomiting and Other (See Comments)    Review of Systems  Constitutional:  Negative for chills and fever.  HENT:  Positive for congestion.   Eyes:  Negative for visual  disturbance.  Respiratory:  Positive for cough. Negative for chest tightness, shortness of breath and wheezing.   Cardiovascular:  Negative for chest pain.  Neurological:  Negative for dizziness and headaches.       Objective:    Physical Exam HENT:     Head: Normocephalic.     Mouth/Throat:     Mouth: Mucous membranes are moist.  Cardiovascular:     Rate and Rhythm: Normal rate.     Heart sounds: Normal heart sounds.  Pulmonary:     Effort: Pulmonary effort is normal.     Breath sounds: Normal breath sounds.  Neurological:     Mental Status: She is alert.     BP 122/82   Pulse (!) 102   Ht 5\' 1"  (1.549 m)   Wt 137 lb (62.1 kg)   SpO2 (!) 5%   BMI 25.89 kg/m  Wt Readings from Last 3 Encounters:  10/12/23 137 lb (62.1 kg)  10/04/23 136 lb 6.4 oz (61.9 kg)  09/20/23 137 lb (62.1 kg)       Assessment & Plan:  Viral illness Assessment & Plan: The patient reported testing negative for COVID at the onset of her symptoms. She will be treated today with prednisone for 5 days. Azithromycin has been provided for anaerobic coverage, and Promethazine DM for cough and cold symptoms.  The patient is advised to:  Take all medications as prescribed. Increase fluid intake and allow for plenty of rest. Use Tylenol as needed for pain, fever, or general discomfort. Perform warm saltwater gargles 3-4 times daily to help with throat pain or discomfort. Use a humidifier at bedtime to help with cough and nasal congestion. The patient is encouraged to follow up if symptoms do not improve.    Orders: -     predniSONE; Take 2 tablets (40 mg total) by mouth daily for 5 days.  Dispense: 10 tablet; Refill: 0 -     Azithromycin; Take 2 tablets on day 1, then 1 tablet daily on days 2 through 5  Dispense: 6 tablet; Refill: 0 -     Promethazine-DM; Take 5 mLs by mouth 4 (four) times daily as needed.  Dispense: 118 mL; Refill: 0  Note: This chart has been completed using Restaurant manager, fast food software, and while attempts have been made to ensure accuracy, certain words and phrases may not be transcribed as intended.    Gilmore Laroche, FNP

## 2023-10-21 ENCOUNTER — Encounter: Payer: Self-pay | Admitting: Family Medicine

## 2023-10-21 ENCOUNTER — Telehealth: Payer: Self-pay | Admitting: Internal Medicine

## 2023-10-21 NOTE — Telephone Encounter (Signed)
Not as bad as it was but the cough will not go away, not coughing up any mucus, its just dry and its very irritating and she just wants something to help stop it. She uses [facility]

## 2023-10-21 NOTE — Telephone Encounter (Signed)
Patient calling back. Waiting on a call back.

## 2023-10-21 NOTE — Telephone Encounter (Addendum)
Patient called in regard to last visit on 10/23 with Alyssa Durham  Pt has now finished antibiotic and prednisone  Wants to see if she can get another round of meds sent in she is still experiencing cough    Wants a cll back

## 2023-10-22 ENCOUNTER — Other Ambulatory Visit: Payer: Self-pay | Admitting: Internal Medicine

## 2023-10-22 DIAGNOSIS — M778 Other enthesopathies, not elsewhere classified: Secondary | ICD-10-CM

## 2023-10-24 ENCOUNTER — Other Ambulatory Visit: Payer: Self-pay | Admitting: Internal Medicine

## 2023-10-24 DIAGNOSIS — R052 Subacute cough: Secondary | ICD-10-CM

## 2023-10-24 MED ORDER — BENZONATATE 200 MG PO CAPS
200.0000 mg | ORAL_CAPSULE | Freq: Two times a day (BID) | ORAL | 1 refills | Status: DC | PRN
Start: 2023-10-24 — End: 2023-11-14

## 2023-10-24 NOTE — Telephone Encounter (Signed)
Mychart message sent.

## 2023-10-27 ENCOUNTER — Other Ambulatory Visit: Payer: Self-pay | Admitting: Internal Medicine

## 2023-10-27 DIAGNOSIS — M810 Age-related osteoporosis without current pathological fracture: Secondary | ICD-10-CM

## 2023-11-02 DIAGNOSIS — Z1211 Encounter for screening for malignant neoplasm of colon: Secondary | ICD-10-CM | POA: Diagnosis not present

## 2023-11-11 ENCOUNTER — Telehealth (INDEPENDENT_AMBULATORY_CARE_PROVIDER_SITE_OTHER): Payer: Self-pay

## 2023-11-11 ENCOUNTER — Other Ambulatory Visit (INDEPENDENT_AMBULATORY_CARE_PROVIDER_SITE_OTHER): Payer: Self-pay

## 2023-11-11 DIAGNOSIS — I700 Atherosclerosis of aorta: Secondary | ICD-10-CM

## 2023-11-11 DIAGNOSIS — R768 Other specified abnormal immunological findings in serum: Secondary | ICD-10-CM

## 2023-11-11 DIAGNOSIS — I10 Essential (primary) hypertension: Secondary | ICD-10-CM | POA: Diagnosis not present

## 2023-11-11 DIAGNOSIS — B349 Viral infection, unspecified: Secondary | ICD-10-CM | POA: Diagnosis not present

## 2023-11-11 DIAGNOSIS — I4510 Unspecified right bundle-branch block: Secondary | ICD-10-CM | POA: Diagnosis not present

## 2023-11-11 DIAGNOSIS — E663 Overweight: Secondary | ICD-10-CM

## 2023-11-11 DIAGNOSIS — B181 Chronic viral hepatitis B without delta-agent: Secondary | ICD-10-CM

## 2023-11-11 DIAGNOSIS — E785 Hyperlipidemia, unspecified: Secondary | ICD-10-CM

## 2023-11-11 DIAGNOSIS — K7400 Hepatic fibrosis, unspecified: Secondary | ICD-10-CM

## 2023-11-11 DIAGNOSIS — G43819 Other migraine, intractable, without status migrainosus: Secondary | ICD-10-CM | POA: Diagnosis not present

## 2023-11-11 NOTE — Telephone Encounter (Signed)
Please send orders for CBC, CMP, hepatitis B surface Antigen, hepatitis B DNA viral load

## 2023-11-11 NOTE — Telephone Encounter (Signed)
Also send INR please

## 2023-11-11 NOTE — Telephone Encounter (Signed)
Patient made aware we have orders a battery of tests and she is aware she can go to [facility] today and have these done. I have placed the orders for [facility] in the Electronic medical record system.

## 2023-11-11 NOTE — Telephone Encounter (Signed)
Patient called today to see if she needs any blood work drawn prior to her visit here on Monday 11/14/2023. Please advise.

## 2023-11-13 LAB — COLOGUARD: COLOGUARD: POSITIVE — AB

## 2023-11-14 ENCOUNTER — Ambulatory Visit (INDEPENDENT_AMBULATORY_CARE_PROVIDER_SITE_OTHER): Payer: PPO | Admitting: Gastroenterology

## 2023-11-14 ENCOUNTER — Encounter (INDEPENDENT_AMBULATORY_CARE_PROVIDER_SITE_OTHER): Payer: Self-pay | Admitting: Gastroenterology

## 2023-11-14 ENCOUNTER — Encounter (INDEPENDENT_AMBULATORY_CARE_PROVIDER_SITE_OTHER): Payer: Self-pay | Admitting: *Deleted

## 2023-11-14 ENCOUNTER — Other Ambulatory Visit: Payer: Self-pay

## 2023-11-14 VITALS — BP 118/72 | HR 61 | Temp 97.3°F | Ht 62.0 in | Wt 139.2 lb

## 2023-11-14 DIAGNOSIS — K7400 Hepatic fibrosis, unspecified: Secondary | ICD-10-CM

## 2023-11-14 DIAGNOSIS — Z8719 Personal history of other diseases of the digestive system: Secondary | ICD-10-CM | POA: Diagnosis not present

## 2023-11-14 DIAGNOSIS — B181 Chronic viral hepatitis B without delta-agent: Secondary | ICD-10-CM

## 2023-11-14 DIAGNOSIS — R195 Other fecal abnormalities: Secondary | ICD-10-CM | POA: Diagnosis not present

## 2023-11-14 DIAGNOSIS — K5900 Constipation, unspecified: Secondary | ICD-10-CM | POA: Insufficient documentation

## 2023-11-14 DIAGNOSIS — T50995A Adverse effect of other drugs, medicaments and biological substances, initial encounter: Secondary | ICD-10-CM | POA: Diagnosis not present

## 2023-11-14 DIAGNOSIS — K5903 Drug induced constipation: Secondary | ICD-10-CM | POA: Diagnosis not present

## 2023-11-14 DIAGNOSIS — K5909 Other constipation: Secondary | ICD-10-CM | POA: Insufficient documentation

## 2023-11-14 LAB — CBC WITH DIFFERENTIAL/PLATELET
Absolute Lymphocytes: 1099 {cells}/uL (ref 850–3900)
Absolute Monocytes: 442 {cells}/uL (ref 200–950)
Basophils Absolute: 33 {cells}/uL (ref 0–200)
Basophils Relative: 0.5 %
Eosinophils Absolute: 59 {cells}/uL (ref 15–500)
Eosinophils Relative: 0.9 %
HCT: 46.8 % — ABNORMAL HIGH (ref 35.0–45.0)
Hemoglobin: 15.9 g/dL — ABNORMAL HIGH (ref 11.7–15.5)
MCH: 31.7 pg (ref 27.0–33.0)
MCHC: 34 g/dL (ref 32.0–36.0)
MCV: 93.4 fL (ref 80.0–100.0)
MPV: 10.7 fL (ref 7.5–12.5)
Monocytes Relative: 6.8 %
Neutro Abs: 4869 {cells}/uL (ref 1500–7800)
Neutrophils Relative %: 74.9 %
Platelets: 361 10*3/uL (ref 140–400)
RBC: 5.01 10*6/uL (ref 3.80–5.10)
RDW: 12.3 % (ref 11.0–15.0)
Total Lymphocyte: 16.9 %
WBC: 6.5 10*3/uL (ref 3.8–10.8)

## 2023-11-14 LAB — COMPREHENSIVE METABOLIC PANEL
AG Ratio: 1.6 (calc) (ref 1.0–2.5)
ALT: 18 U/L (ref 6–29)
AST: 23 U/L (ref 10–35)
Albumin: 4.4 g/dL (ref 3.6–5.1)
Alkaline phosphatase (APISO): 58 U/L (ref 37–153)
BUN: 20 mg/dL (ref 7–25)
CO2: 28 mmol/L (ref 20–32)
Calcium: 10 mg/dL (ref 8.6–10.4)
Chloride: 101 mmol/L (ref 98–110)
Creat: 0.93 mg/dL (ref 0.60–1.00)
Globulin: 2.8 g/dL (ref 1.9–3.7)
Glucose, Bld: 86 mg/dL (ref 65–139)
Potassium: 4.1 mmol/L (ref 3.5–5.3)
Sodium: 140 mmol/L (ref 135–146)
Total Bilirubin: 0.6 mg/dL (ref 0.2–1.2)
Total Protein: 7.2 g/dL (ref 6.1–8.1)

## 2023-11-14 LAB — HEPATITIS B SURFACE ANTIGEN: Hepatitis B Surface Ag: REACTIVE — AB

## 2023-11-14 LAB — PROTIME-INR
INR: 1.1
Prothrombin Time: 11.5 s (ref 9.0–11.5)

## 2023-11-14 LAB — HEPATITIS B DNA, ULTRAQUANTITATIVE, PCR
Hepatitis B DNA: NOT DETECTED [IU]/mL
Hepatitis B virus DNA: NOT DETECTED {Log}

## 2023-11-14 NOTE — Progress Notes (Unsigned)
Katrinka Blazing, M.D. Gastroenterology & Hepatology [facility] [address] [place], [place] [address]  Primary Care Physician: Anabel Halon, MD [address] [place] [address]  I will communicate my assessment and recommendations to the referring MD via EMR.  Problems: Hepatitis B on Entecavir  History of Present Illness: Alyssa Durham is a 72 y.o. female with past medical history of anxiety, depression, arthritis, asthma, HLD, Osteoporosis, HTN, Chronic hep b on Entecavir, who comes for follow-up of hepatitis B.   The patient was last seen on 05/09/2023. At that time, the patient was scheduled for liver ultrasound.  She was advised to continue Entecavir daily.  The patient denies having any nausea, vomiting, fever, chills, hematochezia, melena, hematemesis, abdominal distention, abdominal pain, diarrhea, jaundice, pruritus. Has been losing weight as she has been going to [employer].  Patient reports that she has presented some new onset constipation after starting atogepant. She states having recurrent issues of constipation ever since she started this medication. She reports that she has been using Miralax, which has controlled her bowel movement frequency.  Most recent blood workup showed normal CMP with AST 23, ALT 18, creatinine 0.93, BUN 20, CBC was within normal limits, INR was 1.1, positive hepatitis B surface antigen.  Hepatitis B viral load is still pending.  Most recent ultrasound of the right upper quadrant on 05/31/2023 showed coarsened and mildly nodular contour of the liver but no other abnormalities.  Recommended repeat ultrasound in 6 months.  Last Colonoscopy:many years ago in her 33s, cologuard in 07/2020 was negative, had a recent positive Cologuard on 11/02/2023 Last Endoscopy:never  Past Medical History: Past Medical History:  Diagnosis Date   Allergy    Anxiety and depression    Arthritis     Asthma    Cataract    Chronic active type B viral hepatitis (HCC)    Depression    History of HPV infection 11/15/2017   HLD (hyperlipidemia) 05/12/2017   Localized swelling of left lower leg 01/01/2021   Migraines    Osteoporosis    Primary hypertension 01/01/2021    Past Surgical History: Past Surgical History:  Procedure Laterality Date   CESAREAN SECTION     x3   EYE SURGERY  2019 and 2020   cataracts   KNEE SURGERY  2023   LIVER BIOPSY  1993   URETHRAL DILATION     WISDOM TOOTH EXTRACTION      Family History: Family History  Problem Relation Age of Onset   Uterine cancer Mother    Cancer Mother    Hearing loss Mother    Vision loss Mother    Migraines Father    Early death Father 1       suicide    Transient ischemic attack Father        multiple    Stroke Father    Migraines Brother    Healthy Daughter    Drug abuse Son        addict - stable on suboxone   Hepatitis B Son    Healthy Son    Cancer Maternal Grandmother        breast   Alcohol abuse Paternal Grandfather    Early death Paternal Aunt        suicide   Mental illness Paternal Aunt    Depression Paternal Aunt     Social History: Social History   Tobacco Use  Smoking Status Former   Current  packs/day: 0.00   Average packs/day: 2.0 packs/day for 5.0 years (10.0 ttl pk-yrs)   Types: Cigarettes   Start date: 12/21/1967   Quit date: 12/20/1972   Years since quitting: 50.9   Passive exposure: Past  Smokeless Tobacco Never  Tobacco Comments   I began smoking when I went to college. 76 - 78   Social History   Substance and Sexual Activity  Alcohol Use No   Social History   Substance and Sexual Activity  Drug Use No    Allergies: Allergies  Allergen Reactions   Codeine Nausea And Vomiting and Other (See Comments)    Medications: Current Outpatient Medications  Medication Sig Dispense Refill   amLODipine (NORVASC) 2.5 MG tablet Take 1 tablet (2.5 mg total) by mouth daily.  90 tablet 1   Atogepant (QULIPTA) 60 MG TABS Take 1 tablet (60 mg total) by mouth daily. 30 tablet 12   cholecalciferol (VITAMIN D3) 25 MCG (1000 UNIT) tablet Take 1,000 Units by mouth daily.     diclofenac Sodium (VOLTAREN) 1 % GEL APPLY 4 GRAMS TO THE AFFECTED AREA FOUR TIMES DAILY 100 g 2   entecavir (BARACLUDE) 0.5 MG tablet Take 1 tablet (0.5 mg total) by mouth daily. 90 tablet 3   ibandronate (BONIVA) 150 MG tablet TAKE (1) TABLET ONCE A MONTH ONLY. TAKE WITH 6 TO 8 OZ OF WATER AND DO NOT LAY DOWN FOR 1 HOUR. 3 tablet 3   Multiple Vitamin (MULTIVITAMIN) capsule Take 1 capsule by mouth daily.     Multiple Vitamins-Minerals (HAIR SKIN & NAILS PO) Take by mouth daily at 6 (six) AM.     mupirocin ointment (BACTROBAN) 2 % APPLY 1 APPLICATION TOPICALLY TWICE DAILY. 22 g 0   OVER THE COUNTER MEDICATION CQ 10 daily  Magnesium citrate once per day.     RELPAX 40 MG tablet May repeat in 2 hours if headache persists or recurs. 12 tablet 11   tretinoin (RETIN-A) 0.1 % cream Apply 1 application topically at bedtime as needed. Patient states that she uses sporadic     zolpidem (AMBIEN) 5 MG tablet Take 1 tablet (5 mg total) by mouth at bedtime as needed. for sleep 30 tablet 2   No current facility-administered medications for this visit.    Review of Systems: GENERAL: negative for malaise, night sweats HEENT: No changes in hearing or vision, no nose bleeds or other nasal problems. NECK: Negative for lumps, goiter, pain and significant neck swelling RESPIRATORY: Negative for cough, wheezing CARDIOVASCULAR: Negative for chest pain, leg swelling, palpitations, orthopnea GI: SEE HPI MUSCULOSKELETAL: Negative for joint pain or swelling, back pain, and muscle pain. SKIN: Negative for lesions, rash PSYCH: Negative for sleep disturbance, mood disorder and recent psychosocial stressors. HEMATOLOGY Negative for prolonged bleeding, bruising easily, and swollen nodes. ENDOCRINE: Negative for cold or heat  intolerance, polyuria, polydipsia and goiter. NEURO: negative for tremor, gait imbalance, syncope and seizures. The remainder of the review of systems is noncontributory.   Physical Exam: BP 118/72 (BP Location: Right Arm, Patient Position: Sitting, Cuff Size: Normal)   Pulse 61   Temp (!) 97.3 F (36.3 C) (Temporal)   Ht 5\' 2"  (1.575 m)   Wt 139 lb 3.2 oz (63.1 kg)   BMI 25.46 kg/m  GENERAL: The patient is AO x3, in no acute distress. HEENT: Head is normocephalic and atraumatic. EOMI are intact. Mouth is well hydrated and without lesions. NECK: Supple. No masses LUNGS: Clear to auscultation. No presence of rhonchi/wheezing/rales. Adequate chest  expansion HEART: RRR, normal s1 and s2. ABDOMEN: Soft, nontender, no guarding, no peritoneal signs, and nondistended. BS +. No masses. EXTREMITIES: Without any cyanosis, clubbing, rash, lesions or edema. NEUROLOGIC: AOx3, no focal motor deficit. SKIN: no jaundice, no rashes  Imaging/Labs: as above  I personally reviewed and interpreted the available labs, imaging and endoscopic files.  Impression and Plan: Alyssa Durham is a 72 y.o. female with past medical history of anxiety, depression, arthritis, asthma, HLD, Osteoporosis, HTN, Chronic hep b on Entecavir, who comes for follow-up of hepatitis B.  Patient has been doing well and denies any significant complaints.  She has tolerated Entecavir adequately.  She is still hepatitis B surface antigen positive and will need to continue on Entecavir until she achieves seroconversion.  Will follow results of hepatitis B viral load.  Given her history of advanced fibrosis, will need to continue with serial ultrasounds every 6 months for [facility] screening.  Patient has been having some constipation issues in the past with the use of atogepant, which he has been able to manage with MiraLAX.  She should continue with this on a regular basis.  Finally, the patient had a positive Cologuard test. Patient  does not have any high risk factors for colorectal cancer malignancy. Discussed cologuard test results in detail, specifically what it means when the test is positive or negative.  Discussed that there is a possibility that even when the test is positive there may not be a polyp found on colonoscopy. More than 50% of the office visit was dedicated to discussing the procedure, including the day of and risks involved. Patient understands what the procedure involves including the benefits and any risks. Patient understands alternatives to the proposed procedure. Risks including (but not limited to) bleeding, tearing of the lining (perforation), rupture of adjacent organs, problems with heart and lung function, infection, and medication reactions. A small percentage of complications may require surgery, hospitalization, repeat endoscopic procedure, and/or transfusion. A small percentage of polyps and other tumors may not be seen.  - Schedule colonoscopy -Schedule liver US - Continue Entecavir daily - Continue Miralax daily  All questions were answered.      Katrinka Blazing, MD Gastroenterology and Hepatology [facility]

## 2023-11-14 NOTE — Patient Instructions (Addendum)
Schedule  colonoscopy Schedule liver US Continue Entecavir daily Continue Miralax daily

## 2023-11-15 ENCOUNTER — Telehealth (INDEPENDENT_AMBULATORY_CARE_PROVIDER_SITE_OTHER): Payer: Self-pay | Admitting: Gastroenterology

## 2023-11-15 ENCOUNTER — Encounter (INDEPENDENT_AMBULATORY_CARE_PROVIDER_SITE_OTHER): Payer: Self-pay

## 2023-11-15 NOTE — Telephone Encounter (Signed)
Pt left message returning call.  Returned call to patient. Pt did not want to do 12/26(only day for Dr.Castaneda). advised pt I would call if cancellation and/or when January schedule comes out. Advised pt she could call me next week since people will probably cancel after thanksgiving. Pt verbalized understanding.

## 2023-11-15 NOTE — Telephone Encounter (Signed)
[place] scheduled for 11/24/23 at 9:30am [facility].  Pt to arrive at 9:15am. NPO after midnight. Left message to return call to give [place] appt and schedule colonoscopy. Will send my chart message with [place] appt.

## 2023-11-24 ENCOUNTER — Ambulatory Visit (HOSPITAL_COMMUNITY)
Admission: RE | Admit: 2023-11-24 | Discharge: 2023-11-24 | Disposition: A | Discharge status: Home / Self Care | Payer: PPO | Source: Ambulatory Visit | Attending: Gastroenterology | Admitting: Gastroenterology

## 2023-11-24 DIAGNOSIS — B181 Chronic viral hepatitis B without delta-agent: Secondary | ICD-10-CM | POA: Diagnosis not present

## 2023-11-24 DIAGNOSIS — B1910 Unspecified viral hepatitis B without hepatic coma: Secondary | ICD-10-CM | POA: Diagnosis not present

## 2023-11-25 ENCOUNTER — Encounter (INDEPENDENT_AMBULATORY_CARE_PROVIDER_SITE_OTHER): Payer: Self-pay

## 2023-11-30 NOTE — Telephone Encounter (Signed)
Left message to return call 

## 2023-12-19 ENCOUNTER — Encounter (INDEPENDENT_AMBULATORY_CARE_PROVIDER_SITE_OTHER): Payer: Self-pay

## 2023-12-19 NOTE — Telephone Encounter (Signed)
Left message to return call.  Letter mailed.

## 2023-12-26 ENCOUNTER — Telehealth (INDEPENDENT_AMBULATORY_CARE_PROVIDER_SITE_OTHER): Payer: Self-pay | Admitting: Gastroenterology

## 2023-12-26 NOTE — Telephone Encounter (Signed)
 Pt left voicemail to schedule TCS. Returned call to pt but had to leave voicemail to return call  (Dr.Castaneda Room Any Positive Cologuard)

## 2023-12-26 NOTE — Telephone Encounter (Signed)
 Pt returned call. Pt would like to schedule in Feb so she can have morning appt. Pt would like to be schedule at 11:30 so she can get to hospital at 10am. Advised pt that I would call her when I have feb schedule to get her set up. She can not do 2nd Tuesday of the month.

## 2023-12-28 MED ORDER — PEG 3350-KCL-NA BICARB-NACL 420 G PO SOLR: 4000.0000 mL | Freq: Once | ORAL | 0 refills | Status: AC

## 2023-12-28 NOTE — Addendum Note (Signed)
 Addended by: Marlowe Shores on: 12/28/2023 12:03 PM   Modules accepted: Orders

## 2023-12-28 NOTE — Telephone Encounter (Signed)
 Slot held for 01/25/24 at 11:30am. Left message for pt to return call

## 2023-12-28 NOTE — Telephone Encounter (Signed)
 Pt returned call and states 2/5 will be ok. Instructions wills be mailed. Prep sent to pharmacy

## 2024-01-12 ENCOUNTER — Other Ambulatory Visit: Payer: Self-pay | Admitting: Internal Medicine

## 2024-01-12 DIAGNOSIS — I10 Essential (primary) hypertension: Secondary | ICD-10-CM

## 2024-01-19 ENCOUNTER — Telehealth (INDEPENDENT_AMBULATORY_CARE_PROVIDER_SITE_OTHER): Payer: Self-pay | Admitting: *Deleted

## 2024-01-19 ENCOUNTER — Encounter (INDEPENDENT_AMBULATORY_CARE_PROVIDER_SITE_OTHER): Payer: Self-pay

## 2024-01-19 NOTE — Telephone Encounter (Signed)
Pt left vm stating she was watching the video from Alyssa Durham for her upcoming colonoscopy and it said to make sure the doctor knew of any previous surgeries. She wanted to make sure her 3 c sections were in her chart and I called her back and left on her vm that her surgical history was noted in her chart

## 2024-01-25 ENCOUNTER — Ambulatory Visit (HOSPITAL_COMMUNITY): Payer: PPO | Admitting: Anesthesiology

## 2024-01-25 ENCOUNTER — Encounter (INDEPENDENT_AMBULATORY_CARE_PROVIDER_SITE_OTHER): Payer: Self-pay

## 2024-01-25 ENCOUNTER — Ambulatory Visit (HOSPITAL_COMMUNITY)
Admission: RE | Admit: 2024-01-25 | Discharge: 2024-01-25 | Disposition: A | Discharge status: Home / Self Care | Payer: PPO | Attending: Gastroenterology | Admitting: Gastroenterology

## 2024-01-25 ENCOUNTER — Encounter (INDEPENDENT_AMBULATORY_CARE_PROVIDER_SITE_OTHER): Payer: Self-pay | Admitting: *Deleted

## 2024-01-25 ENCOUNTER — Encounter (HOSPITAL_COMMUNITY)
Admission: RE | Disposition: A | Discharge status: Home / Self Care | Payer: Self-pay | Source: Home / Self Care | Attending: Gastroenterology

## 2024-01-25 ENCOUNTER — Other Ambulatory Visit: Payer: Self-pay

## 2024-01-25 DIAGNOSIS — Z1211 Encounter for screening for malignant neoplasm of colon: Secondary | ICD-10-CM | POA: Diagnosis not present

## 2024-01-25 DIAGNOSIS — E785 Hyperlipidemia, unspecified: Secondary | ICD-10-CM | POA: Diagnosis not present

## 2024-01-25 DIAGNOSIS — M1990 Unspecified osteoarthritis, unspecified site: Secondary | ICD-10-CM | POA: Insufficient documentation

## 2024-01-25 DIAGNOSIS — F419 Anxiety disorder, unspecified: Secondary | ICD-10-CM | POA: Insufficient documentation

## 2024-01-25 DIAGNOSIS — F32A Depression, unspecified: Secondary | ICD-10-CM | POA: Insufficient documentation

## 2024-01-25 DIAGNOSIS — B181 Chronic viral hepatitis B without delta-agent: Secondary | ICD-10-CM | POA: Insufficient documentation

## 2024-01-25 DIAGNOSIS — Z7722 Contact with and (suspected) exposure to environmental tobacco smoke (acute) (chronic): Secondary | ICD-10-CM | POA: Diagnosis not present

## 2024-01-25 DIAGNOSIS — R195 Other fecal abnormalities: Secondary | ICD-10-CM | POA: Insufficient documentation

## 2024-01-25 DIAGNOSIS — Z87891 Personal history of nicotine dependence: Secondary | ICD-10-CM | POA: Insufficient documentation

## 2024-01-25 DIAGNOSIS — Z79899 Other long term (current) drug therapy: Secondary | ICD-10-CM | POA: Diagnosis not present

## 2024-01-25 DIAGNOSIS — D120 Benign neoplasm of cecum: Secondary | ICD-10-CM | POA: Diagnosis not present

## 2024-01-25 DIAGNOSIS — D122 Benign neoplasm of ascending colon: Secondary | ICD-10-CM | POA: Insufficient documentation

## 2024-01-25 DIAGNOSIS — J45909 Unspecified asthma, uncomplicated: Secondary | ICD-10-CM | POA: Diagnosis not present

## 2024-01-25 DIAGNOSIS — D490 Neoplasm of unspecified behavior of digestive system: Secondary | ICD-10-CM

## 2024-01-25 DIAGNOSIS — I10 Essential (primary) hypertension: Secondary | ICD-10-CM | POA: Diagnosis not present

## 2024-01-25 DIAGNOSIS — K648 Other hemorrhoids: Secondary | ICD-10-CM | POA: Insufficient documentation

## 2024-01-25 DIAGNOSIS — K635 Polyp of colon: Secondary | ICD-10-CM | POA: Diagnosis not present

## 2024-01-25 DIAGNOSIS — D124 Benign neoplasm of descending colon: Secondary | ICD-10-CM | POA: Insufficient documentation

## 2024-01-25 HISTORY — PX: COLONOSCOPY WITH PROPOFOL: SHX5780

## 2024-01-25 HISTORY — PX: POLYPECTOMY: SHX149

## 2024-01-25 LAB — HM COLONOSCOPY

## 2024-01-25 SURGERY — COLONOSCOPY WITH PROPOFOL
Anesthesia: General

## 2024-01-25 MED ORDER — LACTATED RINGERS IV SOLN: INTRAVENOUS | Status: DC | PRN

## 2024-01-25 MED ORDER — SODIUM CHLORIDE 0.9% FLUSH: 3.0000 mL | Freq: Two times a day (BID) | INTRAVENOUS | Status: DC

## 2024-01-25 MED ORDER — PROPOFOL 10 MG/ML IV BOLUS
INTRAVENOUS | Status: DC | PRN
  Administered 2024-01-25: 50 mg via INTRAVENOUS

## 2024-01-25 MED ORDER — STERILE WATER FOR IRRIGATION IR SOLN
Status: DC | PRN
  Administered 2024-01-25: 50 mL

## 2024-01-25 MED ORDER — PROPOFOL 500 MG/50ML IV EMUL
INTRAVENOUS | Status: DC | PRN
  Administered 2024-01-25: 150 ug/kg/min via INTRAVENOUS

## 2024-01-25 MED ORDER — SODIUM CHLORIDE 0.9% FLUSH: 3.0000 mL | INTRAVENOUS | Status: DC | PRN

## 2024-01-25 MED ORDER — LIDOCAINE HCL (CARDIAC) PF 100 MG/5ML IV SOSY
PREFILLED_SYRINGE | INTRAVENOUS | Status: DC | PRN
  Administered 2024-01-25: 50 mg via INTRAVENOUS

## 2024-01-25 MED ORDER — DEXMEDETOMIDINE HCL IN NACL 80 MCG/20ML IV SOLN
INTRAVENOUS | Status: DC | PRN
  Administered 2024-01-25: 8 ug via INTRAVENOUS

## 2024-01-25 NOTE — Anesthesia Preprocedure Evaluation (Signed)
 Anesthesia Evaluation  Patient identified by MRN, date of birth, ID band Patient awake    Reviewed: Allergy & Precautions, H&P , NPO status , Patient's Chart, lab work & pertinent test results, reviewed documented beta blocker date and time   Airway Mallampati: II  TM Distance: >3 FB Neck ROM: full    Dental no notable dental hx. (+) Dental Advisory Given, Teeth Intact   Pulmonary asthma , former smoker   Pulmonary exam normal breath sounds clear to auscultation       Cardiovascular Exercise Tolerance: Good hypertension, Normal cardiovascular exam Rhythm:regular Rate:Normal     Neuro/Psych  Headaches PSYCHIATRIC DISORDERS Anxiety Depression       GI/Hepatic negative GI ROS,,,(+) Hepatitis -, B  Endo/Other  negative endocrine ROS    Renal/GU negative Renal ROS  negative genitourinary   Musculoskeletal  (+) Arthritis , Osteoarthritis,    Abdominal   Peds  Hematology negative hematology ROS (+)   Anesthesia Other Findings   Reproductive/Obstetrics negative OB ROS                             Anesthesia Physical Anesthesia Plan  ASA: 2  Anesthesia Plan: General   Post-op Pain Management: Minimal or no pain anticipated   Induction: Intravenous  PONV Risk Score and Plan: Propofol  infusion  Airway Management Planned: Nasal Cannula and Natural Airway  Additional Equipment: None  Intra-op Plan:   Post-operative Plan:   Informed Consent: I have reviewed the patients History and Physical, chart, labs and discussed the procedure including the risks, benefits and alternatives for the proposed anesthesia with the patient or authorized representative who has indicated his/her understanding and acceptance.     Dental Advisory Given  Plan Discussed with: CRNA  Anesthesia Plan Comments:         Anesthesia Quick Evaluation

## 2024-01-25 NOTE — Discharge Instructions (Signed)
 You are being discharged to home.  Resume your previous diet.  We are waiting for your pathology results.  Your physician has recommended a repeat colonoscopy for surveillance based on pathology results.

## 2024-01-25 NOTE — Transfer of Care (Signed)
 Immediate Anesthesia Transfer of Care Note  Patient: Alyssa Durham  Procedure(s) Performed: COLONOSCOPY WITH PROPOFOL   Patient Location: Short Stay  Anesthesia Type:General  Level of Consciousness: awake, alert , oriented, and patient cooperative  Airway & Oxygen Therapy: Patient Spontanous Breathing  Post-op Assessment: Report given to RN, Post -op Vital signs reviewed and stable, and Patient moving all extremities X 4  Post vital signs: Reviewed and stable  Last Vitals:  Vitals Value Taken Time  BP 105/69 01/25/24 1104  Temp 36.4 C 01/25/24 1104  Pulse 88 01/25/24 1104  Resp 13 01/25/24 1104  SpO2 97 % 01/25/24 1104    Last Pain:  Vitals:   01/25/24 1104  TempSrc: Oral  PainSc: 0-No pain      Patients Stated Pain Goal: 6 (01/25/24 1007)  Complications: No notable events documented.

## 2024-01-25 NOTE — Anesthesia Postprocedure Evaluation (Signed)
 Anesthesia Post Note  Patient: Alyssa Durham  Procedure(s) Performed: COLONOSCOPY WITH PROPOFOL   Patient location during evaluation: PACU Anesthesia Type: General Level of consciousness: awake and alert Pain management: pain level controlled Vital Signs Assessment: post-procedure vital signs reviewed and stable Respiratory status: spontaneous breathing, nonlabored ventilation, respiratory function stable and patient connected to nasal cannula oxygen Cardiovascular status: blood pressure returned to baseline and stable Postop Assessment: no apparent nausea or vomiting Anesthetic complications: no   There were no known notable events for this encounter.   Last Vitals:  Vitals:   01/25/24 1007 01/25/24 1104  BP: 129/86 105/69  Pulse: 100   Resp: (!) 23 13  Temp: 36.6 C 36.4 C  SpO2: 99% 97%    Last Pain:  Vitals:   01/25/24 1104  TempSrc: Oral  PainSc: 0-No pain                 Keili Hasten L Caellum Mancil

## 2024-01-25 NOTE — H&P (Signed)
 Alyssa Durham is an 73 y.o. female.   Chief Complaint: positive Cologuard. HPI: Alyssa Durham is a 73 y.o. female with past medical history of anxiety, depression, arthritis, asthma, HLD, Osteoporosis, HTN, Chronic hep b on Entecavir , who comes for positive Cologuard.  The patient denies having any nausea, vomiting, fever, chills, hematochezia, melena, hematemesis, abdominal distention, abdominal pain, diarrhea, jaundice, pruritus or weight loss.  Past Medical History:  Diagnosis Date   Allergy    Anxiety and depression    Arthritis    Asthma    Cataract    Chronic active type B viral hepatitis (HCC)    Depression    History of HPV infection 11/15/2017   HLD (hyperlipidemia) 05/12/2017   Localized swelling of left lower leg 01/01/2021   Migraines    Osteoporosis    Primary hypertension 01/01/2021    Past Surgical History:  Procedure Laterality Date   CESAREAN SECTION     x3   EYE SURGERY  2019 and 2020   cataracts   KNEE SURGERY  2023   LIVER BIOPSY  1993   URETHRAL DILATION     WISDOM TOOTH EXTRACTION      Family History  Problem Relation Age of Onset   Uterine cancer Mother    Cancer Mother    Hearing loss Mother    Vision loss Mother    Migraines Father    Early death Father 46       suicide    Transient ischemic attack Father        multiple    Stroke Father    Migraines Brother    Healthy Daughter    Drug abuse Son        addict - stable on suboxone   Hepatitis B Son    Healthy Son    Cancer Maternal Grandmother        breast   Alcohol abuse Paternal Grandfather    Early death Paternal Aunt        suicide   Mental illness Paternal Aunt    Depression Paternal Aunt    Social History:  reports that she quit smoking about 51 years ago. Her smoking use included cigarettes. She started smoking about 56 years ago. She has a 10 pack-year smoking history. She has been exposed to tobacco smoke. She has never used smokeless tobacco. She reports that she  does not drink alcohol and does not use drugs.  Allergies:  Allergies  Allergen Reactions   Codeine Nausea And Vomiting and Other (See Comments)    Medications Prior to Admission  Medication Sig Dispense Refill   amLODipine  (NORVASC ) 2.5 MG tablet TAKE ONE TABLET BY MOUTH EVERY DAY 90 tablet 1   Atogepant  (QULIPTA ) 60 MG TABS Take 1 tablet (60 mg total) by mouth daily. 30 tablet 12   cholecalciferol (VITAMIN D3) 25 MCG (1000 UNIT) tablet Take 1,000 Units by mouth daily.     diclofenac  Sodium (VOLTAREN ) 1 % GEL APPLY 4 GRAMS TO THE AFFECTED AREA FOUR TIMES DAILY 100 g 2   entecavir  (BARACLUDE ) 0.5 MG tablet Take 1 tablet (0.5 mg total) by mouth daily. 90 tablet 3   ibandronate  (BONIVA ) 150 MG tablet TAKE (1) TABLET ONCE A MONTH ONLY. TAKE WITH 6 TO 8 OZ OF WATER  AND DO NOT LAY DOWN FOR 1 HOUR. 3 tablet 3   Multiple Vitamin (MULTIVITAMIN) capsule Take 1 capsule by mouth daily.     Multiple Vitamins-Minerals (HAIR SKIN & NAILS PO) Take by mouth  daily at 6 (six) AM.     mupirocin  ointment (BACTROBAN ) 2 % APPLY 1 APPLICATION TOPICALLY TWICE DAILY. 22 g 0   OVER THE COUNTER MEDICATION CQ 10 daily  Magnesium citrate once per day.     RELPAX  40 MG tablet May repeat in 2 hours if headache persists or recurs. 12 tablet 11   tretinoin (RETIN-A) 0.1 % cream Apply 1 application topically at bedtime as needed. Patient states that she uses sporadic     zolpidem  (AMBIEN ) 5 MG tablet Take 1 tablet (5 mg total) by mouth at bedtime as needed. for sleep 30 tablet 2    No results found for this or any previous visit (from the past 48 hours). No results found.  Review of Systems  All other systems reviewed and are negative.   Blood pressure 129/86, pulse 100, temperature 97.8 F (36.6 C), temperature source Oral, resp. rate (!) 23, height 5' 1 (1.549 m), weight 62.6 kg, SpO2 99%. Physical Exam  GENERAL: The patient is AO x3, in no acute distress. HEENT: Head is normocephalic and atraumatic. EOMI are  intact. Mouth is well hydrated and without lesions. NECK: Supple. No masses LUNGS: Clear to auscultation. No presence of rhonchi/wheezing/rales. Adequate chest expansion HEART: RRR, normal s1 and s2. ABDOMEN: Soft, nontender, no guarding, no peritoneal signs, and nondistended. BS +. No masses. EXTREMITIES: Without any cyanosis, clubbing, rash, lesions or edema. NEUROLOGIC: AOx3, no focal motor deficit. SKIN: no jaundice, no rashes  Assessment/Plan Alyssa Durham is a 73 y.o. female with past medical history of anxiety, depression, arthritis, asthma, HLD, Osteoporosis, HTN, Chronic hep b on Entecavir , who comes for positive Cologuard.  We will proceed with colonoscopy.  Toribio Eartha Flavors, MD 01/25/2024, 10:28 AM

## 2024-01-25 NOTE — Op Note (Signed)
 [facility] Patient Name: Alyssa Durham Procedure Date: 01/25/2024 10:15 AM MRN: [id number] Date of Birth: 1951-09-19 Attending MD: Toribio Fortune , , [id number] CSN: [id number] Age: 73 Admit Type: Outpatient Procedure:                Colonoscopy Indications:              Positive Cologuard test Providers:                Toribio Fortune, Jon LABOR. Gerome RN, RN, Bascom Blush Referring MD:              Medicines:                Monitored Anesthesia Care Complications:            No immediate complications. Estimated Blood Loss:     Estimated blood loss: none. Procedure:                Pre-Anesthesia Assessment:                           - Prior to the procedure, a History and Physical                            was performed, and patient medications, allergies                            and sensitivities were reviewed. The patient's                            tolerance of previous anesthesia was reviewed.                           - The risks and benefits of the procedure and the                            sedation options and risks were discussed with the                            patient. All questions were answered and informed                            consent was obtained.                           - ASA Grade Assessment: II - A patient with mild                            systemic disease.                           After obtaining informed consent, the colonoscope                            was passed under direct vision. Throughout the  procedure, the patient's blood pressure, pulse, and                            oxygen saturations were monitored continuously. The                            [id number]-HQ190L ([id number]) scope was introduced through                            the anus and advanced to the the cecum, identified                            by appendiceal orifice and ileocecal valve. The                             colonoscopy was performed without difficulty. The                            patient tolerated the procedure well. The quality                            of the bowel preparation was good. Scope In: 10:35:56 AM Scope Out: 10:58:56 AM Scope Withdrawal Time: 0 hours 16 minutes 10 seconds  Total Procedure Duration: 0 hours 23 minutes 0 seconds  Findings:      The perianal and digital rectal examinations were normal.      Three sessile and semi-sessile polyps were found in the descending       colon, ascending colon and cecum. The polyps were 4 to 8 mm in size.       These polyps were removed with a cold snare. Resection and retrieval       were complete.      Non-bleeding internal hemorrhoids were found during retroflexion. The       hemorrhoids were small. Impression:               - Three 4 to 8 mm polyps in the descending colon,                            in the ascending colon and in the cecum, removed                            with a cold snare. Resected and retrieved.                           - Non-bleeding internal hemorrhoids. Moderate Sedation:      Per Anesthesia Care Recommendation:           - Discharge patient to home (ambulatory).                           - Resume previous diet.                           - Await pathology results.                           -  Repeat colonoscopy for surveillance based on                            pathology results. Procedure Code(s):        --- Professional ---                           [phone], Colonoscopy, flexible; with removal of                            tumor(s), polyp(s), or other lesion(s) by snare                            technique Diagnosis Code(s):        --- Professional ---                           D12.4, Benign neoplasm of descending colon                           D12.2, Benign neoplasm of ascending colon                           D12.0, Benign neoplasm of cecum                           K64.8, Other hemorrhoids                            R19.5, Other fecal abnormalities CPT copyright 2022 [employer]. All rights reserved. The codes documented in this report are preliminary and upon coder review may  be revised to meet current compliance requirements. Toribio Fortune, MD Toribio Fortune,  01/25/2024 11:06:00 AM This report has been signed electronically. Number of Addenda: 0

## 2024-01-26 ENCOUNTER — Encounter (HOSPITAL_COMMUNITY): Payer: Self-pay | Admitting: Gastroenterology

## 2024-01-26 LAB — SURGICAL PATHOLOGY

## 2024-01-27 ENCOUNTER — Encounter (INDEPENDENT_AMBULATORY_CARE_PROVIDER_SITE_OTHER): Payer: Self-pay | Admitting: Gastroenterology

## 2024-02-02 ENCOUNTER — Encounter (INDEPENDENT_AMBULATORY_CARE_PROVIDER_SITE_OTHER): Payer: Self-pay | Admitting: *Deleted

## 2024-02-07 ENCOUNTER — Encounter: Payer: Self-pay | Admitting: Internal Medicine

## 2024-02-07 ENCOUNTER — Ambulatory Visit (INDEPENDENT_AMBULATORY_CARE_PROVIDER_SITE_OTHER): Payer: PPO | Admitting: Internal Medicine

## 2024-02-07 VITALS — BP 132/82 | HR 100 | Ht 61.5 in | Wt 141.0 lb

## 2024-02-07 DIAGNOSIS — G43819 Other migraine, intractable, without status migrainosus: Secondary | ICD-10-CM

## 2024-02-07 DIAGNOSIS — M810 Age-related osteoporosis without current pathological fracture: Secondary | ICD-10-CM | POA: Diagnosis not present

## 2024-02-07 DIAGNOSIS — R739 Hyperglycemia, unspecified: Secondary | ICD-10-CM

## 2024-02-07 DIAGNOSIS — F4321 Adjustment disorder with depressed mood: Secondary | ICD-10-CM

## 2024-02-07 DIAGNOSIS — K5909 Other constipation: Secondary | ICD-10-CM

## 2024-02-07 DIAGNOSIS — B181 Chronic viral hepatitis B without delta-agent: Secondary | ICD-10-CM

## 2024-02-07 DIAGNOSIS — E782 Mixed hyperlipidemia: Secondary | ICD-10-CM

## 2024-02-07 DIAGNOSIS — I10 Essential (primary) hypertension: Secondary | ICD-10-CM

## 2024-02-07 DIAGNOSIS — G4700 Insomnia, unspecified: Secondary | ICD-10-CM | POA: Diagnosis not present

## 2024-02-07 NOTE — Assessment & Plan Note (Signed)
On Qulipta and Relpax Follows up with Neurology

## 2024-02-07 NOTE — Assessment & Plan Note (Signed)
On Baraclude Followed by GI

## 2024-02-07 NOTE — Assessment & Plan Note (Signed)
 She attributes it to [place] Has tried MiraLAX with adequate relief Advised to take Senokot-S BID for constipation, can take MiraLAX additionally for persistent constipation Maintain adequate hydration

## 2024-02-07 NOTE — Progress Notes (Signed)
 Established Patient Office Visit  Subjective:  Patient ID: Alyssa Durham, female    DOB: 07-11-1951  Age: 73 y.o. MRN: [id number]  CC:  Chief Complaint  Patient presents with   Care Management    4 month f/u   Hypertension    HPI Alyssa Durham is a 73 y.o. female with past medical history of HTN, chronic migraine, mild intermittent asthma related to allergies, osteoporosis, chronic hep B, lichen sclerosus, and gluteal tendinitis of right buttock who presents for f/u of her chronic medical conditions.  HTN: BP is well-controlled. Takes Amlodipine 2.5 mg QD regularly. Patient denies headache, dizziness, chest pain, dyspnea or palpitations.  Patient follows up with neurology for chronic migraine and takes Relpax and Qulipta for it.  She was on Nurtec in the past.   She has history of osteoporosis, for which she takes [place].  She is taking calcium and Vitamin D supplements.  Constipation: She reports chronic constipation, due to [place].  She has tried taking MiraLAX for it, but asked if she can continue taking it for long-term.  She has not tried any other stool softeners or laxatives before.  Denies any melena or hematochezia.  She has been stressed due to her mother's death in September 22, 2024.  She had conflicts with her brother regarding care of her mother.  She has been getting grief counseling through her mother's hospice team currently and has been feeling better. She has chronic insomnia, for which she takes Ambien as needed.  Denies any SI or HI currently.   Past Medical History:  Diagnosis Date   Allergy    Anxiety and depression    Arthritis    Asthma    Cataract    Chronic active type B viral hepatitis (HCC)    Depression    History of HPV infection 11/15/2017   HLD (hyperlipidemia) 05/12/2017   Localized swelling of left lower leg 01/01/2021   Migraines    Osteoporosis    Primary hypertension 01/01/2021    Past Surgical History:  Procedure Laterality Date    CESAREAN SECTION     x3   COLONOSCOPY WITH PROPOFOL N/A 01/25/2024   Procedure: COLONOSCOPY WITH PROPOFOL;  Surgeon: Dolores Frame, MD;  Location: [facility];  Service: Gastroenterology;  Laterality: N/A;  11:30am;asa 1-2   EYE SURGERY  2019 and 2020   cataracts   KNEE SURGERY  2023   LIVER BIOPSY  1993   POLYPECTOMY N/A 01/25/2024   Procedure: POLYPECTOMY INTESTINAL;  Surgeon: Dolores Frame, MD;  Location: [facility];  Service: Gastroenterology;  Laterality: N/A;   URETHRAL DILATION     WISDOM TOOTH EXTRACTION      Family History  Problem Relation Age of Onset   Uterine cancer Mother    Cancer Mother    Hearing loss Mother    Vision loss Mother    Migraines Father    Early death Father 80       suicide    Transient ischemic attack Father        multiple    Stroke Father    Migraines Brother    Healthy Daughter    Drug abuse Son        addict - stable on suboxone   Hepatitis B Son    Healthy Son    Cancer Maternal Grandmother        breast   Alcohol abuse Paternal Grandfather    Early death Paternal Aunt  suicide   Mental illness Paternal Aunt    Depression Paternal Aunt     Social History   Socioeconomic History   Marital status: Divorced    Spouse name: Not on file   Number of children: 3   Years of education: 14   Highest education level: Bachelor's degree (e.g., BA, AB, BS)  Occupational History   Occupation: retired    Comment: Advertising account executive  Tobacco Use   Smoking status: Former    Current packs/day: 0.00    Average packs/day: 2.0 packs/day for 5.0 years (10.0 ttl pk-yrs)    Types: Cigarettes    Start date: 12/21/1967    Quit date: 12/20/1972    Years since quitting: 51.1    Passive exposure: Past   Smokeless tobacco: Never   Tobacco comments:    I began smoking when I went to college. 1969 - 1974  Vaping Use   Vaping status: Never Used  Substance and Sexual Activity   Alcohol use: No   Drug use: No   Sexual  activity: Not Currently  Other Topics Concern   Not on file  Social History Narrative   Retired Environmental health practitioner   Three children  [place] [employer] - 2   Right handed   [employer]   Social Drivers of Health   Financial Resource Strain: Medium Risk (02/06/2024)   Overall Financial Resource Strain (CARDIA)    Difficulty of Paying Living Expenses: Somewhat hard  Food Insecurity: No Food Insecurity (02/06/2024)   Hunger Vital Sign    Worried About Running Out of Food in the Last Year: Never true    Ran Out of Food in the Last Year: Never true  Transportation Needs: No Transportation Needs (02/06/2024)   PRAPARE - Administrator, Civil Service (Medical): No    Lack of Transportation (Non-Medical): No  Physical Activity: Inactive (02/06/2024)   Exercise Vital Sign    Days of Exercise per Week: 0 days    Minutes of Exercise per Session: 20 min  Stress: No Stress Concern Present (02/06/2024)   [employer] of Occupational Health - Occupational Stress Questionnaire    Feeling of Stress : Only a little  Social Connections: Moderately Integrated (02/06/2024)   Social Connection and Isolation Panel [NHANES]    Frequency of Communication with Friends and Family: Never    Frequency of Social Gatherings with Friends and Family: Three times a week    Attends Religious Services: More than 4 times per year    Active Member of Clubs or Organizations: Yes    Attends Banker Meetings: More than 4 times per year    Marital Status: Divorced  Intimate Partner Violence: Not At Risk (01/14/2022)   Humiliation, Afraid, Rape, and Kick questionnaire    Fear of Current or Ex-Partner: No    Emotionally Abused: No    Physically Abused: No    Sexually Abused: No    Outpatient Medications Prior to Visit  Medication Sig Dispense Refill   amLODipine (NORVASC) 2.5 MG tablet TAKE ONE TABLET BY MOUTH EVERY DAY 90 tablet 1   Atogepant (QULIPTA) 60 MG  TABS Take 1 tablet (60 mg total) by mouth daily. 30 tablet 12   cholecalciferol (VITAMIN D3) 25 MCG (1000 UNIT) tablet Take 1,000 Units by mouth daily.     diclofenac Sodium (VOLTAREN) 1 % GEL APPLY 4 GRAMS TO THE AFFECTED AREA FOUR TIMES DAILY 100 g 2   entecavir (BARACLUDE) 0.5 MG tablet  Take 1 tablet (0.5 mg total) by mouth daily. 90 tablet 3   ibandronate (BONIVA) 150 MG tablet TAKE (1) TABLET ONCE A MONTH ONLY. TAKE WITH 6 TO 8 OZ OF WATER AND DO NOT LAY DOWN FOR 1 HOUR. 3 tablet 3   Multiple Vitamin (MULTIVITAMIN) capsule Take 1 capsule by mouth daily.     Multiple Vitamins-Minerals (HAIR SKIN & NAILS PO) Take by mouth daily at 6 (six) AM.     mupirocin ointment (BACTROBAN) 2 % APPLY 1 APPLICATION TOPICALLY TWICE DAILY. 22 g 0   OVER THE COUNTER MEDICATION CQ 10 daily  Magnesium citrate once per day.     RELPAX 40 MG tablet May repeat in 2 hours if headache persists or recurs. 12 tablet 11   tretinoin (RETIN-A) 0.1 % cream Apply 1 application topically at bedtime as needed. Patient states that she uses sporadic     zolpidem (AMBIEN) 5 MG tablet Take 1 tablet (5 mg total) by mouth at bedtime as needed. for sleep 30 tablet 2   No facility-administered medications prior to visit.    Allergies  Allergen Reactions   Codeine Nausea And Vomiting and Other (See Comments)    ROS Review of Systems  Constitutional:  Negative for chills and fever.  HENT:  Negative for congestion, postnasal drip, rhinorrhea, sinus pressure, sinus pain and sore throat.   Eyes:  Negative for pain and discharge.  Respiratory:  Negative for cough and shortness of breath.   Cardiovascular:  Negative for chest pain and palpitations.  Gastrointestinal:  Negative for abdominal pain, diarrhea, nausea and vomiting.  Endocrine: Negative for polydipsia and polyuria.  Genitourinary:  Negative for dysuria and hematuria.  Musculoskeletal:  Positive for arthralgias, back pain and joint swelling. Negative for neck pain and  neck stiffness.  Skin:  Negative for rash.  Neurological:  Negative for dizziness, seizures, syncope and weakness.  Psychiatric/Behavioral:  Positive for sleep disturbance. Negative for agitation and behavioral problems. The patient is nervous/anxious.       Objective:    Physical Exam Vitals reviewed.  Constitutional:      General: She is not in acute distress.    Appearance: She is not diaphoretic.  HENT:     Head: Normocephalic and atraumatic.     Nose: Nose normal. No congestion.     Mouth/Throat:     Mouth: Mucous membranes are moist.     Pharynx: No posterior oropharyngeal erythema.  Eyes:     General: No scleral icterus.    Extraocular Movements: Extraocular movements intact.  Neck:     Vascular: No carotid bruit.  Cardiovascular:     Rate and Rhythm: Normal rate and regular rhythm.     Pulses: Normal pulses.     Heart sounds: Normal heart sounds. No murmur heard. Pulmonary:     Breath sounds: Normal breath sounds. No wheezing or rales.  Musculoskeletal:     Left shoulder: Tenderness present. Decreased range of motion.     Right hand: Swelling (Mild PIP and DIP joints) present.     Left hand: Swelling (Mild PIP and DIP joints) present.     Cervical back: Neck supple. No tenderness.     Left knee: Swelling (Mild) present. No effusion or erythema. Decreased range of motion.     Left lower leg: Swelling present.  Skin:    General: Skin is warm.     Findings: No rash.  Neurological:     General: No focal deficit present.  Mental Status: She is alert and oriented to person, place, and time.  Psychiatric:        Mood and Affect: Mood normal.        Behavior: Behavior normal.     BP 132/82   Pulse 100   Ht 5' 1.5" (1.562 m)   Wt 141 lb (64 kg)   SpO2 98%   BMI 26.21 kg/m  Wt Readings from Last 3 Encounters:  02/07/24 141 lb (64 kg)  01/25/24 138 lb (62.6 kg)  11/14/23 139 lb 3.2 oz (63.1 kg)    Lab Results  Component Value Date   TSH 2.530  08/01/2023   Lab Results  Component Value Date   WBC 6.5 11/11/2023   HGB 15.9 (H) 11/11/2023   HCT 46.8 (H) 11/11/2023   MCV 93.4 11/11/2023   PLT 361 11/11/2023   Lab Results  Component Value Date   NA 140 11/11/2023   K 4.1 11/11/2023   CO2 28 11/11/2023   GLUCOSE 86 11/11/2023   BUN 20 11/11/2023   CREATININE 0.93 11/11/2023   BILITOT 0.6 11/11/2023   ALKPHOS 63 08/01/2023   AST 23 11/11/2023   ALT 18 11/11/2023   PROT 7.2 11/11/2023   ALBUMIN 4.3 08/01/2023   CALCIUM 10.0 11/11/2023   EGFR 68 08/01/2023   Lab Results  Component Value Date   CHOL 211 (H) 08/01/2023   Lab Results  Component Value Date   HDL 75 08/01/2023   Lab Results  Component Value Date   LDLCALC 122 (H) 08/01/2023   Lab Results  Component Value Date   TRIG 79 08/01/2023   Lab Results  Component Value Date   CHOLHDL 2.8 08/01/2023   Lab Results  Component Value Date   HGBA1C 5.2 08/01/2023      Assessment & Plan:   Problem List Items Addressed This Visit       Cardiovascular and Mediastinum   Migraine   On Qulipta and Relpax Follows up with Neurology      Primary hypertension - Primary   BP Readings from Last 1 Encounters:  02/07/24 132/82   Well controlled with amlodipine 2.5 mg QD,refilled Had component of white coat hypertension Advised DASH diet and moderate exercise/walking, at least 150 mins/week      Relevant Orders   CMP14+EGFR     Digestive   Chronic hepatitis B (HCC)   On Baraclude Followed by GI      Chronic constipation   She attributes it to [place] Has tried MiraLAX with adequate relief Advised to take Senokot-S BID for constipation, can take MiraLAX additionally for persistent constipation Maintain adequate hydration        Musculoskeletal and Integument   Osteoporosis   Last DEXA scan reviewed On Boniva now On Calcium and Vitamin D supplement (Caltrate)        Other   Hyperlipidemia   Advised to follow low-cholesterol diet.       Relevant Orders   Lipid Profile   Insomnia   Takes Ambien 5 mg nightly as needed      Grief reaction   Due to her mother's demise and loss of her dog She is having anxiety due to her conflict with her brother Currently getting grief counseling through her mother's hospice care      Other Visit Diagnoses       Hyperglycemia       Relevant Orders   CMP14+EGFR   Hemoglobin A1c  No orders of the defined types were placed in this encounter.   Follow-up: Return in about 4 months (around 06/06/2024) for HTN.    Anabel Halon, MD

## 2024-02-07 NOTE — Patient Instructions (Addendum)
 Please take Senokot-S up to twice daily for constipation.  Please continue to take medications as prescribed.  Please continue to follow low salt diet and perform moderate exercise/walking as tolerated.  Please get fasting blood tests done before the next visit.

## 2024-02-07 NOTE — Assessment & Plan Note (Signed)
Last DEXA scan reviewed On Boniva now On Calcium and Vitamin D supplement (Caltrate) 

## 2024-02-07 NOTE — Assessment & Plan Note (Signed)
Advised to follow low cholesterol diet 

## 2024-02-07 NOTE — Assessment & Plan Note (Signed)
 Due to her mother's demise and loss of her dog She is having anxiety due to her conflict with her brother Currently getting grief counseling through her mother's hospice care

## 2024-02-07 NOTE — Assessment & Plan Note (Signed)
 BP Readings from Last 1 Encounters:  02/07/24 132/82   Well controlled with amlodipine 2.5 mg QD,refilled Had component of white coat hypertension Advised DASH diet and moderate exercise/walking, at least 150 mins/week

## 2024-02-07 NOTE — Assessment & Plan Note (Signed)
Takes Ambien 5 mg nightly as needed

## 2024-02-15 ENCOUNTER — Ambulatory Visit (INDEPENDENT_AMBULATORY_CARE_PROVIDER_SITE_OTHER): Payer: PPO

## 2024-02-15 VITALS — Ht 61.5 in | Wt 136.0 lb

## 2024-02-15 DIAGNOSIS — Z1231 Encounter for screening mammogram for malignant neoplasm of breast: Secondary | ICD-10-CM

## 2024-02-15 DIAGNOSIS — Z0000 Encounter for general adult medical examination without abnormal findings: Secondary | ICD-10-CM | POA: Diagnosis not present

## 2024-02-15 NOTE — Patient Instructions (Signed)
 [name] , Thank you for taking time to come for your Medicare Wellness Visit. I appreciate your ongoing commitment to your health goals. Please review the following plan we discussed and let me know if I can assist you in the future.   Referrals/Orders/Follow-Ups/Clinician Recommendations:  Next Medicare Annual Wellness Visit:   February 19, 2025 at 10:00 am TELEPHONE VISIT  An order has been placed for you to have your yearly mammogram. Please call the number below to schedule your appointment  [facility] [address] [place], [place] [address] [phone]  Schedule your [place] screening mammogram through MyChart!   Log into your MyChart account.  Go to 'Visit' (or 'Appointments' if on mobile App) --> Schedule an Appointment  Under 'Select a Reason for Visit' choose the Mammogram Screening option.  Complete the pre-visit questions and select the time and place that best fits your schedule.    This is a list of the screening recommended for you and due dates:  Health Maintenance  Topic Date Due   DTaP/Tdap/Td vaccine (3 - Tdap) 01/15/2021   COVID-19 Vaccine (4 - 2024-25 season) 08/21/2023   Mammogram  03/08/2024   DEXA scan (bone density measurement)  08/11/2024   Medicare Annual Wellness Visit  02/14/2025   Cologuard (Stool DNA test)  11/01/2026   Pneumonia Vaccine  Completed   Flu Shot  Completed   Hepatitis C Screening  Completed   Zoster (Shingles) Vaccine  Completed   HPV Vaccine  Aged Out   Colon Cancer Screening  Discontinued    Advanced directives: (Declined) Advance directive discussed with you today. Even though you declined this today, please call our office should you change your mind, and we can give you the proper paperwork for you to fill out.  Next Medicare Annual Wellness Visit scheduled for next year: yes  Understanding Your Risk for Falls Millions of people have serious injuries from falls each year. It is  important to understand your risk of falling. Talk with your health care provider about your risk and what you can do to lower it. If you do have a serious fall, make sure to tell your provider. Falling once raises your risk of falling again. How can falls affect me? Serious injuries from falls are common. These include: Broken bones, such as hip fractures. Head injuries, such as traumatic brain injuries (TBI) or concussions. A fear of falling can cause you to avoid activities and stay at home. This can make your muscles weaker and raise your risk for a fall. What can increase my risk? There are a number of risk factors that increase your risk for falling. The more risk factors you have, the higher your risk of falling. Serious injuries from a fall happen most often to people who are older than 75 years old. Teenagers and young adults ages 35-29 are also at higher risk. Common risk factors include: Weakness in the lower body. Being generally weak or confused due to long-term (chronic) illness. Dizziness or balance problems. Poor vision. Medicines that cause dizziness or drowsiness. These may include: Medicines for your blood pressure, heart, anxiety, insomnia, or swelling (edema). Pain medicines. Muscle relaxants. Other risk factors include: Drinking alcohol. Having had a fall in the past. Having foot pain or wearing improper footwear. Working at a dangerous job. Having any of the following in your home: Tripping hazards, such as floor clutter or loose rugs. Poor lighting. Pets. Having dementia or memory loss. What actions  can I take to lower my risk of falling?     Physical activity Stay physically fit. Do strength and balance exercises. Consider taking a regular class to build strength and balance. Yoga and tai chi are good options. Vision Have your eyes checked every year and your prescription for glasses or contacts updated as needed. Shoes and walking aids Wear non-skid  shoes. Wear shoes that have rubber soles and low heels. Do not wear high heels. Do not walk around the house in socks or slippers. Use a cane or walker as told by your provider. Home safety Attach secure railings on both sides of your stairs. Install grab bars for your bathtub, shower, and toilet. Use a non-skid mat in your bathtub or shower. Attach bath mats securely with double-sided, non-slip rug tape. Use good lighting in all rooms. Keep a flashlight near your bed. Make sure there is a clear path from your bed to the bathroom. Use night-lights. Do not use throw rugs. Make sure all carpeting is taped or tacked down securely. Remove all clutter from walkways and stairways, including extension cords. Repair uneven or broken steps and floors. Avoid walking on icy or slippery surfaces. Walk on the grass instead of on icy or slick sidewalks. Use ice melter to get rid of ice on walkways in the winter. Use a cordless phone. Questions to ask your health care provider Can you help me check my risk for a fall? Do any of my medicines make me more likely to fall? Should I take a vitamin D supplement? What exercises can I do to improve my strength and balance? Should I make an appointment to have my vision checked? Do I need a bone density test to check for weak bones (osteoporosis)? Would it help to use a cane or a walker? Where to find more information Centers for Disease Control and Prevention, STEADI: TonerPromos.no Community-Based Fall Prevention Programs: TonerPromos.no [employer] on Aging: BaseRingTones.pl Contact a health care provider if: You fall at home. You are afraid of falling at home. You feel weak, drowsy, or dizzy. This information is not intended to replace advice given to you by your health care provider. Make sure you discuss any questions you have with your health care provider. Document Revised: 08/09/2022 Document Reviewed: 08/09/2022 Elsevier Patient Education  2024 [employer].

## 2024-02-15 NOTE — Progress Notes (Signed)
 Because this visit was a virtual/telehealth visit,  certain criteria was not obtained, such a blood pressure, CBG if applicable, and timed get up and go. Any medications not marked as "taking" were not mentioned during the medication reconciliation part of the visit. Any vitals not documented were not able to be obtained due to this being a telehealth visit or patient was unable to self-report a recent blood pressure reading due to a lack of equipment at home via telehealth. Vitals that have been documented are verbally provided by the patient.   Subjective:   Alyssa Durham is a 73 y.o. who presents for a Medicare Wellness preventive visit.  Visit Complete: Virtual I connected with  Alyssa Durham on 02/15/24 by a audio enabled telemedicine application and verified that I am speaking with the correct person using two identifiers.  Patient Location: Home  Provider Location: Home Office  I discussed the limitations of evaluation and management by telemedicine. The patient expressed understanding and agreed to proceed.  Vital Signs: Because this visit was a virtual/telehealth visit, some criteria may be missing or patient reported. Any vitals not documented were not able to be obtained and vitals that have been documented are patient reported.  VideoError- Librarian, academic were attempted between this provider and patient, however failed, due to patient having technical difficulties OR patient did not have access to video capability.  We continued and completed visit with audio only.   AWV Questionnaire: Yes: Patient Medicare AWV questionnaire was completed by the patient on 02/06/2024; I have confirmed that all information answered by patient is correct and no changes since this date.  Cardiac Risk Factors include: advanced age (>74men, >16 women)     Objective:    Today's Vitals   02/15/24 1238 02/15/24 1239  Weight: 136 lb (61.7 kg)   Height: 5' 1.5"  (1.562 m)   PainSc:  0-No pain   Body mass index is 25.28 kg/m.     02/15/2024   12:42 PM 01/25/2024   10:02 AM 09/20/2023    2:58 PM 01/18/2023    3:44 PM 10/04/2022    3:10 PM 01/14/2022    1:26 PM 03/30/2021   12:32 PM  Advanced Directives  Does Patient Have a Medical Advance Directive? No No No No No No No  Would patient like information on creating a medical advance directive? No - Patient declined Yes (MAU/Ambulatory/Procedural Areas - Information given)  Yes (ED - Information included in AVS)  No - Patient declined No - Patient declined    Current Medications (verified) Outpatient Encounter Medications as of 02/15/2024  Medication Sig   amLODipine (NORVASC) 2.5 MG tablet TAKE ONE TABLET BY MOUTH EVERY DAY   Atogepant (QULIPTA) 60 MG TABS Take 1 tablet (60 mg total) by mouth daily.   cholecalciferol (VITAMIN D3) 25 MCG (1000 UNIT) tablet Take 1,000 Units by mouth daily.   diclofenac Sodium (VOLTAREN) 1 % GEL APPLY 4 GRAMS TO THE AFFECTED AREA FOUR TIMES DAILY   entecavir (BARACLUDE) 0.5 MG tablet Take 1 tablet (0.5 mg total) by mouth daily.   ibandronate (BONIVA) 150 MG tablet TAKE (1) TABLET ONCE A MONTH ONLY. TAKE WITH 6 TO 8 OZ OF WATER AND DO NOT LAY DOWN FOR 1 HOUR.   Multiple Vitamin (MULTIVITAMIN) capsule Take 1 capsule by mouth daily.   Multiple Vitamins-Minerals (HAIR SKIN & NAILS PO) Take by mouth daily at 6 (six) AM.   mupirocin ointment (BACTROBAN) 2 % APPLY 1 APPLICATION TOPICALLY  TWICE DAILY.   OVER THE COUNTER MEDICATION CQ 10 daily  Magnesium citrate once per day.   RELPAX 40 MG tablet May repeat in 2 hours if headache persists or recurs.   tretinoin (RETIN-A) 0.1 % cream Apply 1 application topically at bedtime as needed. Patient states that she uses sporadic   zolpidem (AMBIEN) 5 MG tablet Take 1 tablet (5 mg total) by mouth at bedtime as needed. for sleep   No facility-administered encounter medications on file as of 02/15/2024.    Allergies  (verified) Codeine   History: Past Medical History:  Diagnosis Date   Allergy    Anxiety and depression    Arthritis    Asthma    Cataract    Chronic active type B viral hepatitis (HCC)    Depression    History of HPV infection 11/15/2017   HLD (hyperlipidemia) 05/12/2017   Localized swelling of left lower leg 01/01/2021   Migraines    Osteoporosis    Primary hypertension 01/01/2021   Past Surgical History:  Procedure Laterality Date   CESAREAN SECTION     x3   COLONOSCOPY WITH PROPOFOL N/A 01/25/2024   Procedure: COLONOSCOPY WITH PROPOFOL;  Surgeon: Dolores Frame, MD;  [facility];  Service: Gastroenterology;  Laterality: N/A;  11:30am;asa 1-2   EYE SURGERY  2019 and 2020   cataracts   KNEE SURGERY  2023   LIVER BIOPSY  1993   POLYPECTOMY N/A 01/25/2024   Procedure: POLYPECTOMY INTESTINAL;  Surgeon: Dolores Frame, MD;  Location: [facility];  Service: Gastroenterology;  Laterality: N/A;   URETHRAL DILATION     WISDOM TOOTH EXTRACTION     Family History  Problem Relation Age of Onset   Uterine cancer Mother    Cancer Mother    Hearing loss Mother    Vision loss Mother    Migraines Father    Early death Father 47       suicide    Transient ischemic attack Father        multiple    Stroke Father    Migraines Brother    Healthy Daughter    Drug abuse Son        addict - stable on suboxone   Hepatitis B Son    Healthy Son    Cancer Maternal Grandmother        breast   Alcohol abuse Paternal Grandfather    Early death Paternal Aunt        suicide   Mental illness Paternal Aunt    Depression Paternal Aunt    Social History   Socioeconomic History   Marital status: Divorced    Spouse name: Not on file   Number of children: 3   Years of education: 14   Highest education level: Bachelor's degree (e.g., BA, AB, BS)  Occupational History   Occupation: retired    Comment: Advertising account executive  Tobacco Use   Smoking status:  Former    Current packs/day: 0.00    Average packs/day: 2.0 packs/day for 5.0 years (10.0 ttl pk-yrs)    Types: Cigarettes    Start date: 12/21/1967    Quit date: 12/20/1972    Years since quitting: 51.1    Passive exposure: Past   Smokeless tobacco: Never   Tobacco comments:    I began smoking when I went to college. 1969 - 1974  Vaping Use   Vaping status: Never Used  Substance and Sexual Activity   Alcohol use: No  Drug use: No   Sexual activity: Not Currently  Other Topics Concern   Not on file  Social History Narrative   Retired Environmental health practitioner   Three children  [place] [employer] and [employer] - 2   Right handed   [employer]   Social Drivers of Health   Financial Resource Strain: Medium Risk (02/15/2024)   Overall Financial Resource Strain (CARDIA)    Difficulty of Paying Living Expenses: Somewhat hard  Food Insecurity: No Food Insecurity (02/15/2024)   Hunger Vital Sign    Worried About Running Out of Food in the Last Year: Never true    Ran Out of Food in the Last Year: Never true  Transportation Needs: No Transportation Needs (02/15/2024)   PRAPARE - Administrator, Civil Service (Medical): No    Lack of Transportation (Non-Medical): No  Physical Activity: Inactive (02/15/2024)   Exercise Vital Sign    Days of Exercise per Week: 0 days    Minutes of Exercise per Session: 0 min  Stress: No Stress Concern Present (02/15/2024)   [employer] of Occupational Health - Occupational Stress Questionnaire    Feeling of Stress : Only a little  Social Connections: Moderately Integrated (02/15/2024)   Social Connection and Isolation Panel [NHANES]    Frequency of Communication with Friends and Family: Never    Frequency of Social Gatherings with Friends and Family: Three times a week    Attends Religious Services: More than 4 times per year    Active Member of Clubs or Organizations: Yes    Attends Engineer, structural: More than 4  times per year    Marital Status: Divorced    Tobacco Counseling Counseling given: Yes Tobacco comments: I began smoking when I went to college. 1969 - 1974    Clinical Intake:  Pre-visit preparation completed: Yes  Pain : No/denies pain Pain Score: 0-No pain     BMI - recorded: 25.28 Nutritional Status: BMI 25 -29 Overweight Nutritional Risks: None Diabetes: No  How often do you need to have someone help you when you read instructions, pamphlets, or other written materials from your doctor or pharmacy?: 1 - Never  Interpreter Needed?: No  Information entered by :: Maryjean Ka CMA   Activities of Daily Living     02/15/2024   12:41 PM  In your present state of health, do you have any difficulty performing the following activities:  Hearing? 0  Vision? 0  Difficulty concentrating or making decisions? 0  Walking or climbing stairs? 0  Dressing or bathing? 0  Doing errands, shopping? 0  Preparing Food and eating ? N  Using the Toilet? N  In the past six months, have you accidently leaked urine? N  Do you have problems with loss of bowel control? N  Managing your Medications? N  Managing your Finances? N  Housekeeping or managing your Housekeeping? N    Patient Care Team: Anabel Halon, MD as PCP - General (Internal Medicine) Van Clines, MD as Consulting Physician (Neurology) Marguerita Merles, Reuel Boom, MD as Consulting Physician (Gastroenterology) Daisy Lazar, DO (Optometry)  Indicate any recent Medical Services you may have received from other than [facility] providers in the past year (date may be approximate).     Assessment:   This is a routine wellness examination for Nasha.  Hearing/Vision screen Hearing Screening - Comments:: Patient states she seems to have difficulty hearing low tones. She declines a referral today  Vision Screening - Comments::  Wears reading glasses - she is due for her yearly exam now. She will call to schedule that appt. Patient  sees Dr. Daisy Lazar w/ [facility] office.     Goals Addressed             This Visit's Progress    Exercise 3x per week (30 min per time)   On track    Recommend starting a routine exercise program at least 3 days a week for 30-45 minutes at a time as tolerated.       Patient Stated   On track    Be able to walk and exercise.       Depression Screen     02/15/2024   12:42 PM 02/07/2024    1:46 PM 10/12/2023    2:14 PM 10/04/2023    2:04 PM 07/20/2023    9:05 AM 06/09/2023    1:14 PM 03/15/2023    2:22 PM  PHQ 2/9 Scores  PHQ - 2 Score 0 0 2 4 4  0 0  PHQ- 9 Score 0 0 4 7 7       Fall Risk     02/15/2024   12:55 PM 02/07/2024    1:46 PM 10/04/2023    2:04 PM 09/20/2023    2:57 PM 07/20/2023    9:05 AM  Fall Risk   Falls in the past year? 0 0 0 0 0  Number falls in past yr: 0 0 0 0 0  Injury with Fall? 0 0 0 0 0  Risk for fall due to : No Fall Risks No Fall Risks No Fall Risks    Follow up Falls prevention discussed;Falls evaluation completed Falls evaluation completed Falls evaluation completed Falls evaluation completed     MEDICARE RISK AT HOME:  Medicare Risk at Home Any stairs in or around the home?: Yes If so, are there any without handrails?: No Home free of loose throw rugs in walkways, pet beds, electrical cords, etc?: Yes Adequate lighting in your home to reduce risk of falls?: Yes Life alert?: No Use of a cane, walker or w/c?: No Grab bars in the bathroom?: Yes Shower chair or bench in shower?: Yes Elevated toilet seat or a handicapped toilet?: No  TIMED UP AND GO:  Was the test performed?  No  Cognitive Function: 6CIT completed    01/14/2022    1:28 PM 08/16/2016   10:00 AM  MMSE - Mini Mental State Exam  Not completed: Unable to complete   Orientation to time  5  Orientation to Place  5  Registration  3  Attention/ Calculation  5  Recall  3  Language- name 2 objects  2  Language- repeat  1  Language- follow 3 step command  3   Language- read & follow direction  1  Write a sentence  1  Copy design  1  Total score  30        02/15/2024   12:39 PM 01/18/2023    3:37 PM 01/14/2022    1:28 PM  6CIT Screen  What Year? 0 points 0 points 0 points  What month? 0 points 0 points 0 points  What time? 0 points 0 points 0 points  Count back from 20 0 points 0 points 0 points  Months in reverse 0 points 0 points 0 points  Repeat phrase 0 points 0 points 0 points  Total Score 0 points 0 points 0 points    Immunizations Immunization History  Administered Date(s) Administered   Fluad Quad(high Dose 65+) 09/14/2019, 10/22/2021, 09/28/2022   Fluad Trivalent(High Dose 65+) 10/04/2023   Influenza,inj,Quad PF,6+ Mos 09/19/2017   Influenza,inj,quad, With Preservative 09/19/2017, 09/20/2019   Influenza-Unspecified 09/19/2018   Moderna Sars-Covid-2 Vaccination 02/14/2020, 03/13/2020, 11/13/2020   Pneumococcal Conjugate-13 03/26/2015   Pneumococcal Polysaccharide-23 07/21/2011, 09/19/2017   Respiratory Syncytial Virus Vaccine,Recomb Aduvanted(Arexvy) 12/02/2023   Td 01/15/2011   Td (Adult), 2 Lf Tetanus Toxid, Preservative Free 01/15/2011   Zoster Recombinant(Shingrix) 05/18/2022, 08/04/2022   Zoster, Live 03/23/2010    Screening Tests Health Maintenance  Topic Date Due   DTaP/Tdap/Td (3 - Tdap) 01/15/2021   COVID-19 Vaccine (4 - 2024-25 season) 08/21/2023   MAMMOGRAM  03/08/2024   DEXA SCAN  08/11/2024   Medicare Annual Wellness (AWV)  02/14/2025   Fecal DNA (Cologuard)  11/01/2026   Pneumonia Vaccine 43+ Years old  Completed   INFLUENZA VACCINE  Completed   Hepatitis C Screening  Completed   Zoster Vaccines- Shingrix  Completed   HPV VACCINES  Aged Out   Colonoscopy  Discontinued    Health Maintenance  Health Maintenance Due  Topic Date Due   DTaP/Tdap/Td (3 - Tdap) 01/15/2021   COVID-19 Vaccine (4 - 2024-25 season) 08/21/2023   Health Maintenance Items Addressed: Mammogram ordered  Additional  Screening:  Vision Screening: Recommended annual ophthalmology exams for early detection of glaucoma and other disorders of the eye.  Dental Screening: Recommended annual dental exams for proper oral hygiene  Community Resource Referral / Chronic Care Management: CRR required this visit?  No   CCM required this visit?  No     Plan:     I have personally reviewed and noted the following in the patient's chart:   Medical and social history Use of alcohol, tobacco or illicit drugs  Current medications and supplements including opioid prescriptions. Patient is not currently taking opioid prescriptions. Functional ability and status Nutritional status Physical activity Advanced directives List of other physicians Hospitalizations, surgeries, and ER visits in previous 12 months Vitals Screenings to include cognitive, depression, and falls Referrals and appointments  In addition, I have reviewed and discussed with patient certain preventive protocols, quality metrics, and best practice recommendations. A written personalized care plan for preventive services as well as general preventive health recommendations were provided to patient.     Jordan Hawks Jamai Dolce, CMA   02/15/2024   After Visit Summary: (MyChart) Due to this being a telephonic visit, the after visit summary with patients personalized plan was offered to patient via MyChart   Notes: Nothing significant to report at this time.

## 2024-03-12 ENCOUNTER — Encounter (HOSPITAL_COMMUNITY): Payer: Self-pay

## 2024-03-12 ENCOUNTER — Other Ambulatory Visit: Payer: Self-pay | Admitting: Internal Medicine

## 2024-03-12 ENCOUNTER — Inpatient Hospital Stay (HOSPITAL_COMMUNITY): Admission: RE | Admit: 2024-03-12 | Source: Ambulatory Visit

## 2024-03-12 DIAGNOSIS — N904 Leukoplakia of vulva: Secondary | ICD-10-CM

## 2024-04-09 ENCOUNTER — Ambulatory Visit (HOSPITAL_COMMUNITY)
Admission: RE | Admit: 2024-04-09 | Discharge: 2024-04-09 | Disposition: A | Discharge status: Home / Self Care | Source: Ambulatory Visit | Attending: Internal Medicine | Admitting: Internal Medicine

## 2024-04-09 ENCOUNTER — Encounter (HOSPITAL_COMMUNITY): Payer: Self-pay

## 2024-04-09 DIAGNOSIS — Z1231 Encounter for screening mammogram for malignant neoplasm of breast: Secondary | ICD-10-CM | POA: Diagnosis not present

## 2024-04-19 ENCOUNTER — Encounter (INDEPENDENT_AMBULATORY_CARE_PROVIDER_SITE_OTHER): Payer: Self-pay | Admitting: Gastroenterology

## 2024-04-20 ENCOUNTER — Other Ambulatory Visit: Payer: Self-pay | Admitting: Internal Medicine

## 2024-04-20 ENCOUNTER — Ambulatory Visit (INDEPENDENT_AMBULATORY_CARE_PROVIDER_SITE_OTHER)

## 2024-04-20 VITALS — BP 134/87 | HR 93 | Ht 61.0 in | Wt 141.1 lb

## 2024-04-20 DIAGNOSIS — R002 Palpitations: Secondary | ICD-10-CM | POA: Diagnosis not present

## 2024-04-20 DIAGNOSIS — I10 Essential (primary) hypertension: Secondary | ICD-10-CM

## 2024-04-20 DIAGNOSIS — F19982 Other psychoactive substance use, unspecified with psychoactive substance-induced sleep disorder: Secondary | ICD-10-CM

## 2024-04-20 LAB — EKG 12-LEAD

## 2024-04-20 MED ORDER — AMLODIPINE BESYLATE 5 MG PO TABS: 5.0000 mg | ORAL_TABLET | Freq: Every day | ORAL | 1 refills | Status: DC

## 2024-04-20 NOTE — Progress Notes (Unsigned)
 Established Patient Office Visit  Subjective   Patient ID: Alyssa Durham, female    DOB: [birth year]  Age: 73 y.o. MRN: [id number]  Chief Complaint  Patient presents with   Medical Management of Chronic Issues    Pt here for blood pressure, thinks she needs to up her blood pressure meds     HPI  Past Medical History:  Diagnosis Date   Allergy    Anxiety and depression    Arthritis    Asthma    Cataract    Chronic active type B viral hepatitis (HCC)    Depression    History of HPV infection 11/15/2017   HLD (hyperlipidemia) 05/12/2017   Localized swelling of left lower leg 01/01/2021   Migraines    Osteoporosis    Primary hypertension 01/01/2021   Past Surgical History:  Procedure Laterality Date   CESAREAN SECTION     x3   COLONOSCOPY WITH PROPOFOL  N/A 01/25/2024   Procedure: COLONOSCOPY WITH PROPOFOL ;  Surgeon: Urban Garden, MD;  Location: [facility];  Service: Gastroenterology;  Laterality: N/A;  11:30am;asa 1-2   EYE SURGERY  2019 and 2020   cataracts   KNEE SURGERY  2023   LIVER BIOPSY  1993   POLYPECTOMY N/A 01/25/2024   Procedure: POLYPECTOMY INTESTINAL;  Surgeon: Urban Garden, MD;  [facility]: [facility];  Service: Gastroenterology;  Laterality: N/A;   URETHRAL DILATION     WISDOM TOOTH EXTRACTION        Review of Systems  Constitutional:  Positive for malaise/fatigue.  HENT: Negative.    Eyes: Negative.   Respiratory: Negative.    Cardiovascular:  Positive for palpitations. Negative for leg swelling.  Gastrointestinal: Negative.   Genitourinary: Negative.   Musculoskeletal: Negative.   Skin: Negative.   Neurological: Negative.   Psychiatric/Behavioral: Negative.        Objective:     BP 134/87   Pulse 93   Ht 5\' 1"  (1.549 m)   Wt 141 lb 1.9 oz (64 kg)   SpO2 96%   BMI 26.66 kg/m  BP Readings from Last 3 Encounters:  04/20/24 134/87  02/07/24 132/82  01/25/24 105/69   Wt Readings from Last 3 Encounters:   04/20/24 141 lb 1.9 oz (64 kg)  02/15/24 136 lb (61.7 kg)  02/07/24 141 lb (64 kg)      Physical Exam Vitals and nursing note reviewed.  Constitutional:      Appearance: Normal appearance.  HENT:     Head: Normocephalic.     Right Ear: Tympanic membrane, ear canal and external ear normal.     Left Ear: Tympanic membrane, ear canal and external ear normal.     Nose: Nose normal.     Mouth/Throat:     Mouth: Mucous membranes are moist.     Pharynx: Oropharynx is clear.  Cardiovascular:     Rate and Rhythm: Normal rate and regular rhythm.  Pulmonary:     Effort: Pulmonary effort is normal.     Breath sounds: Normal breath sounds.  Musculoskeletal:     Cervical back: Normal range of motion and neck supple.  Skin:    General: Skin is warm and dry.  Neurological:     Mental Status: She is alert and oriented to person, place, and time.  Psychiatric:        Mood and Affect: Mood normal.        Thought Content: Thought content normal.      Results for  orders placed or performed in visit on 04/20/24  EKG 12-Lead  Result Value Ref Range   EKG 12 lead      Last CBC Lab Results  Component Value Date   WBC 6.5 11/11/2023   HGB 15.9 (H) 11/11/2023   HCT 46.8 (H) 11/11/2023   MCV 93.4 11/11/2023   MCH 31.7 11/11/2023   RDW 12.3 11/11/2023   PLT 361 11/11/2023   Last metabolic panel Lab Results  Component Value Date   GLUCOSE 86 11/11/2023   NA 140 11/11/2023   K 4.1 11/11/2023   CL 101 11/11/2023   CO2 28 11/11/2023   BUN 20 11/11/2023   CREATININE 0.93 11/11/2023   EGFR 68 08/01/2023   CALCIUM 10.0 11/11/2023   PROT 7.2 11/11/2023   ALBUMIN 4.3 08/01/2023   LABGLOB 2.0 08/01/2023   AGRATIO 2.1 05/10/2023   BILITOT 0.6 11/11/2023   ALKPHOS 63 08/01/2023   AST 23 11/11/2023   ALT 18 11/11/2023   Last lipids Lab Results  Component Value Date   CHOL 211 (H) 08/01/2023   HDL 75 08/01/2023   LDLCALC 122 (H) 08/01/2023   TRIG 79 08/01/2023   CHOLHDL 2.8  08/01/2023   Last hemoglobin A1c Lab Results  Component Value Date   HGBA1C 5.2 08/01/2023      The 10-year ASCVD risk score (Arnett DK, et al., 2019) is: 20.7%    Assessment & Plan:   Problem List Items Addressed This Visit       Cardiovascular and Mediastinum   Primary hypertension - Primary   Increase amlodipine  from 2.5 mg to 5 mg for elevated blood pressure readings. Recommend low sodium diet and regular exercise to improve blood pressure.       Relevant Medications   amLODipine  (NORVASC ) 5 MG tablet   Other Visit Diagnoses       Palpitations       No abnormality seen on EKG.  Recommend cardiology consult if no improvement.   Relevant Orders   EKG 12-Lead (Completed)       Return in about 3 months (around 07/21/2024).    Alison Irvine, FNP

## 2024-04-20 NOTE — Patient Instructions (Signed)
 EKG in office today was normal.   Amlodipine  increased to 5 mg.  New prescription sent to pharmacy.  May take 2 of the 2.5 mg until they run out.   Recommend continuing to monitor BP at home and let us  know if still staying above 130/80 consistently.    Recommend f/u in 3 months for BP check or sooner if needed.

## 2024-04-22 NOTE — Assessment & Plan Note (Addendum)
 Increase amlodipine  from 2.5 mg to 5 mg for elevated blood pressure readings. Recommend low sodium diet and regular exercise to improve blood pressure.

## 2024-04-25 ENCOUNTER — Telehealth: Payer: Self-pay | Admitting: Gastroenterology

## 2024-04-25 NOTE — Telephone Encounter (Signed)
 I called and left a vm that there were no current labs ordered for her and if he wants anything he will address this with her on her appointment date and time in Feven 2025.

## 2024-04-25 NOTE — Telephone Encounter (Signed)
 Patient is on the recall to follow up with Dr Sammi Crick for Alyssa Durham. She is asking if she will need any labs done prior to her OV. Please advise if she will need labs. [phone]

## 2024-04-27 ENCOUNTER — Encounter (INDEPENDENT_AMBULATORY_CARE_PROVIDER_SITE_OTHER): Payer: Self-pay | Admitting: *Deleted

## 2024-05-02 ENCOUNTER — Telehealth (INDEPENDENT_AMBULATORY_CARE_PROVIDER_SITE_OTHER): Payer: Self-pay | Admitting: Gastroenterology

## 2024-05-02 ENCOUNTER — Other Ambulatory Visit (INDEPENDENT_AMBULATORY_CARE_PROVIDER_SITE_OTHER): Payer: Self-pay | Admitting: Gastroenterology

## 2024-05-02 DIAGNOSIS — K7400 Hepatic fibrosis, unspecified: Secondary | ICD-10-CM

## 2024-05-02 NOTE — Telephone Encounter (Signed)
 Pt left voicemail that she received a letter to schedule 6 month ultrasound (US  Abdomen Complete per last US ). US  ordered and scheduled for 05/11/24 at 8:30am. Pt is to arrive at 8:30am, NPO 6-8 hours. Contacted pt to give her US  appt time. Pt states she is going on a trip with her church and that day will not work. Gave pt central scheduling number to call and reschedule.

## 2024-05-11 ENCOUNTER — Ambulatory Visit (HOSPITAL_COMMUNITY)

## 2024-05-15 ENCOUNTER — Ambulatory Visit (HOSPITAL_COMMUNITY)
Admission: RE | Admit: 2024-05-15 | Discharge: 2024-05-15 | Disposition: A | Discharge status: Home / Self Care | Source: Ambulatory Visit | Attending: Gastroenterology | Admitting: Gastroenterology

## 2024-05-15 DIAGNOSIS — K7400 Hepatic fibrosis, unspecified: Secondary | ICD-10-CM | POA: Insufficient documentation

## 2024-05-15 DIAGNOSIS — B181 Chronic viral hepatitis B without delta-agent: Secondary | ICD-10-CM | POA: Diagnosis not present

## 2024-05-18 ENCOUNTER — Ambulatory Visit (INDEPENDENT_AMBULATORY_CARE_PROVIDER_SITE_OTHER): Payer: Self-pay | Admitting: Gastroenterology

## 2024-05-21 ENCOUNTER — Other Ambulatory Visit (HOSPITAL_COMMUNITY): Payer: Self-pay

## 2024-05-21 ENCOUNTER — Ambulatory Visit (INDEPENDENT_AMBULATORY_CARE_PROVIDER_SITE_OTHER): Admitting: Gastroenterology

## 2024-05-21 ENCOUNTER — Encounter (INDEPENDENT_AMBULATORY_CARE_PROVIDER_SITE_OTHER): Payer: Self-pay | Admitting: Gastroenterology

## 2024-05-21 VITALS — BP 122/78 | HR 66 | Temp 97.8°F | Ht 61.5 in | Wt 143.3 lb

## 2024-05-21 DIAGNOSIS — K5909 Other constipation: Secondary | ICD-10-CM

## 2024-05-21 DIAGNOSIS — K7400 Hepatic fibrosis, unspecified: Secondary | ICD-10-CM

## 2024-05-21 DIAGNOSIS — B181 Chronic viral hepatitis B without delta-agent: Secondary | ICD-10-CM

## 2024-05-21 DIAGNOSIS — K5900 Constipation, unspecified: Secondary | ICD-10-CM

## 2024-05-21 NOTE — Telephone Encounter (Signed)
6 mth US noted in recall 

## 2024-05-21 NOTE — Progress Notes (Signed)
 Alyssa Durham, M.D. Gastroenterology & Hepatology [facility] Gastroenterology [address] [place], [place] [address]  Primary Care Physician: Meldon Sport, MD [address] [place] [address]  I will communicate my assessment and recommendations to the referring MD via EMR.  Problems: Hepatitis B on Entecavir , hepatitis B e antibody positive Chronic idiopathic constipation   History of Present Illness: Alyssa Durham is a 73 y.o. female with past medical history of anxiety, depression, arthritis, asthma, HLD, Osteoporosis, HTN, Chronic hep b on Entecavir , who comes for follow-up of hepatitis B.   The patient was last seen on 11/14/2023. At that time, the patient was scheduled for colonoscopy.  She was continue Entecavir  and MiraLAX daily.  Most recent right upper quadrant ultrasound on 05/15/2024 did not show any masses in the liver.  Most recent labs from 11/11/2023 showed negative hepatitis B DNA, positive hepatitis B surface antigen.  Patient reports that she has felt well, with no complaints. She is not taking Miralax regularly, but mostly takes Sennokot. The patient denies having any nausea, vomiting, fever, chills, hematochezia, melena, hematemesis, abdominal distention, abdominal pain, diarrhea, jaundice, pruritus or weight loss.  Last EGD: Never Last Colonoscopy: 01/25/2024 - Three 4 to 8 mm polyps in the descending colon, in the ascending colon and in the cecum, removed with a cold snare. Resected and retrieved. - Non- bleeding internal hemorrhoids. The pathology showed 3 polyps were tubular adenomas.   recommend a repeat colonoscopy in 5 years.   Past Medical History: Past Medical History:  Diagnosis Date   Allergy    Anxiety and depression    Arthritis    Asthma    Cataract    Chronic active type B viral hepatitis (HCC)    Depression    History of HPV infection 11/15/2017   HLD (hyperlipidemia) 05/12/2017   Localized swelling  of left lower leg 01/01/2021   Migraines    Osteoporosis    Primary hypertension 01/01/2021    Past Surgical History: Past Surgical History:  Procedure Laterality Date   CESAREAN SECTION     x3   COLONOSCOPY WITH PROPOFOL  N/A 01/25/2024   Procedure: COLONOSCOPY WITH PROPOFOL ;  Surgeon: Urban Garden, MD;  [facility];  Service: Gastroenterology;  Laterality: N/A;  11:30am;asa 1-2   EYE SURGERY  2019 and 2020   cataracts   KNEE SURGERY  2023   LIVER BIOPSY  1993   POLYPECTOMY N/A 01/25/2024   Procedure: POLYPECTOMY INTESTINAL;  Surgeon: Urban Garden, MD;  [facility];  Service: Gastroenterology;  Laterality: N/A;   URETHRAL DILATION     WISDOM TOOTH EXTRACTION      Family History: Family History  Problem Relation Age of Onset   Uterine cancer Mother    Cancer Mother    Hearing loss Mother    Vision loss Mother    Migraines Father    Early death Father 33       suicide    Transient ischemic attack Father        multiple    Stroke Father    Healthy Daughter    Early death Paternal Aunt        suicide   Mental illness Paternal Aunt    Depression Paternal Aunt    Breast cancer Maternal Grandmother    Cancer Maternal Grandmother        breast   Alcohol abuse Paternal Grandfather    Migraines Brother    Drug  abuse Son        addict - stable on suboxone   Hepatitis B Son    Healthy Son     Social History: Social History   Tobacco Use  Smoking Status Former   Current packs/day: 0.00   Average packs/day: 2.0 packs/day for 5.0 years (10.0 ttl pk-yrs)   Types: Cigarettes   Start date: 12/21/1967   Quit date: 12/20/1972   Years since quitting: 51.4   Passive exposure: Past  Smokeless Tobacco Never  Tobacco Comments   I began smoking when I went to college. 69 - 57   Social History   Substance and Sexual Activity  Alcohol Use No   Social History   Substance and Sexual Activity  Drug Use No     Allergies: Allergies  Allergen Reactions   Codeine Nausea And Vomiting and Other (See Comments)    Medications: Current Outpatient Medications  Medication Sig Dispense Refill   amLODipine  (NORVASC ) 5 MG tablet Take 1 tablet (5 mg total) by mouth daily. 90 tablet 1   Atogepant  (QULIPTA ) 60 MG TABS Take 1 tablet (60 mg total) by mouth daily. 30 tablet 12   cholecalciferol (VITAMIN D3) 25 MCG (1000 UNIT) tablet Take 1,000 Units by mouth daily.     diclofenac  Sodium (VOLTAREN ) 1 % GEL APPLY 4 GRAMS TO THE AFFECTED AREA FOUR TIMES DAILY 100 g 2   entecavir  (BARACLUDE ) 0.5 MG tablet Take 1 tablet (0.5 mg total) by mouth daily. 90 tablet 3   ibandronate  (BONIVA ) 150 MG tablet TAKE (1) TABLET ONCE A MONTH ONLY. TAKE WITH 6 TO 8 OZ OF WATER  AND DO NOT LAY DOWN FOR 1 HOUR. 3 tablet 3   Multiple Vitamin (MULTIVITAMIN) capsule Take 1 capsule by mouth daily.     Multiple Vitamins-Minerals (HAIR SKIN & NAILS PO) Take by mouth daily at 6 (six) AM.     mupirocin  ointment (BACTROBAN ) 2 % APPLY 1 APPLICATION TOPICALLY TWICE DAILY. 22 g 0   OVER THE COUNTER MEDICATION CQ 10 daily  Magnesium citrate once per day.  Cartilage supplement daily Fish oil daily     RELPAX  40 MG tablet May repeat in 2 hours if headache persists or recurs. 12 tablet 11   tretinoin (RETIN-A) 0.1 % cream Apply 1 application topically at bedtime as needed. Patient states that she uses sporadic     zolpidem  (AMBIEN ) 5 MG tablet TAKE ONE TABLET BY MOUTH AT BEDTIME AS NEEDED 30 tablet 2   No current facility-administered medications for this visit.    Review of Systems: GENERAL: negative for malaise, night sweats HEENT: No changes in hearing or vision, no nose bleeds or other nasal problems. NECK: Negative for lumps, goiter, pain and significant neck swelling RESPIRATORY: Negative for cough, wheezing CARDIOVASCULAR: Negative for chest pain, leg swelling, palpitations, orthopnea GI: SEE HPI MUSCULOSKELETAL: Negative for joint  pain or swelling, back pain, and muscle pain. SKIN: Negative for lesions, rash PSYCH: Negative for sleep disturbance, mood disorder and recent psychosocial stressors. HEMATOLOGY Negative for prolonged bleeding, bruising easily, and swollen nodes. ENDOCRINE: Negative for cold or heat intolerance, polyuria, polydipsia and goiter. NEURO: negative for tremor, gait imbalance, syncope and seizures. The remainder of the review of systems is noncontributory.   Physical Exam: BP 122/78 (BP Location: Right Arm, Patient Position: Sitting, Cuff Size: Normal) Comment (Cuff Size): Manual  Pulse 66   Temp 97.8 F (36.6 C) (Temporal)   Ht 5' 1.5" (1.562 m)   Wt 143 lb 4.8 oz (65  kg)   BMI 26.64 kg/m  GENERAL: The patient is AO x3, in no acute distress. HEENT: Head is normocephalic and atraumatic. EOMI are intact. Mouth is well hydrated and without lesions. NECK: Supple. No masses LUNGS: Clear to auscultation. No presence of rhonchi/wheezing/rales. Adequate chest expansion HEART: RRR, normal s1 and s2. ABDOMEN: Soft, nontender, no guarding, no peritoneal signs, and nondistended. BS +. No masses. EXTREMITIES: Without any cyanosis, clubbing, rash, lesions or edema. NEUROLOGIC: AOx3, no focal motor deficit. SKIN: no jaundice, no rashes  Imaging/Labs: as above  I personally reviewed and interpreted the available labs, imaging and endoscopic files.  Impression and Plan: Alyssa Durham is a 73 y.o. female with past medical history of anxiety, depression, arthritis, asthma, HLD, Osteoporosis, HTN, Chronic hep b on Entecavir , who comes for follow-up of hepatitis B.  Patient has presented adequate control of her chronic viral infection while on Entecavir  daily.  We will obtain surveillance labs today.  Given her history of liver fibrosis, she will need to have a repeat ultrasound in 6 months.  Finally, her constipation has been adequately controlled with the use of Senokot which she needs to continue on  a daily basis.  - Check CBC, CMP, hepatitis B viral load and hepatitis B surface antigen -Repeat liver US  in 6 months -Continue Entecavir  daily -Continue Senokot daily  All questions were answered.      Alyssa Cress, MD Gastroenterology and Hepatology [facility] Gastroenterology

## 2024-05-21 NOTE — Patient Instructions (Signed)
 Perform blood workup Repeat liver US  in 6 months Continue Entecavir  daily Continue Senokot daily

## 2024-05-22 ENCOUNTER — Other Ambulatory Visit (HOSPITAL_COMMUNITY): Payer: Self-pay

## 2024-05-22 ENCOUNTER — Telehealth: Payer: Self-pay

## 2024-05-22 NOTE — Telephone Encounter (Signed)
*  [facility]  Pharmacy Patient Advocate Encounter   Received notification from Pt Calls Messages that prior authorization for Qulipta  is required/requested.   Insurance verification completed.   The patient is insured through [facility] ADVANTAGE/RX ADVANCE .   Per test claim: PA required; PA submitted to above mentioned insurance via Phone Key/confirmation #/EOC [id number] Status is pending   Additional information/chart notes sent to plan via fax as requested from representative.

## 2024-05-22 NOTE — Telephone Encounter (Signed)
 Pt pharmacy sent a request for a PA needing to be done on Qulipta  60mg 

## 2024-05-22 NOTE — Telephone Encounter (Signed)
 PA request has been Submitted. New Encounter has been or will be created for follow up. For additional info see Pharmacy Prior Auth telephone encounter from 06/03.

## 2024-05-24 ENCOUNTER — Other Ambulatory Visit (INDEPENDENT_AMBULATORY_CARE_PROVIDER_SITE_OTHER): Payer: Self-pay | Admitting: Gastroenterology

## 2024-05-24 DIAGNOSIS — B181 Chronic viral hepatitis B without delta-agent: Secondary | ICD-10-CM

## 2024-05-25 ENCOUNTER — Other Ambulatory Visit (HOSPITAL_COMMUNITY): Payer: Self-pay

## 2024-05-25 NOTE — Telephone Encounter (Signed)
 Pharmacy Patient Advocate Encounter  Received notification from [employer]RX ADVANCE that Prior Authorization for QULIPTA  60MG  has been APPROVED from 6.3.25 to 6.3.26. Ran test claim, Copay is $12.15. This test claim was processed through [facility]- copay amounts may vary at other pharmacies due to pharmacy/plan contracts, or as the patient moves through the different stages of their insurance plan.   PA #/Case ID/Reference #: [id number]

## 2024-05-30 DIAGNOSIS — K7400 Hepatic fibrosis, unspecified: Secondary | ICD-10-CM | POA: Diagnosis not present

## 2024-05-30 DIAGNOSIS — B181 Chronic viral hepatitis B without delta-agent: Secondary | ICD-10-CM | POA: Diagnosis not present

## 2024-05-31 DIAGNOSIS — L820 Inflamed seborrheic keratosis: Secondary | ICD-10-CM | POA: Diagnosis not present

## 2024-06-04 ENCOUNTER — Ambulatory Visit (INDEPENDENT_AMBULATORY_CARE_PROVIDER_SITE_OTHER): Payer: Self-pay | Admitting: Gastroenterology

## 2024-06-04 LAB — CBC WITH DIFFERENTIAL/PLATELET
Absolute Lymphocytes: 1177 {cells}/uL (ref 850–3900)
Absolute Monocytes: 461 {cells}/uL (ref 200–950)
Basophils Absolute: 58 {cells}/uL (ref 0–200)
Basophils Relative: 1.1 %
Eosinophils Absolute: 42 {cells}/uL (ref 15–500)
Eosinophils Relative: 0.8 %
HCT: 44.6 % (ref 35.0–45.0)
Hemoglobin: 15.1 g/dL (ref 11.7–15.5)
MCH: 31.2 pg (ref 27.0–33.0)
MCHC: 33.9 g/dL (ref 32.0–36.0)
MCV: 92.1 fL (ref 80.0–100.0)
MPV: 10.5 fL (ref 7.5–12.5)
Monocytes Relative: 8.7 %
Neutro Abs: 3562 {cells}/uL (ref 1500–7800)
Neutrophils Relative %: 67.2 %
Platelets: 289 10*3/uL (ref 140–400)
RBC: 4.84 10*6/uL (ref 3.80–5.10)
RDW: 12.9 % (ref 11.0–15.0)
Total Lymphocyte: 22.2 %
WBC: 5.3 10*3/uL (ref 3.8–10.8)

## 2024-06-04 LAB — COMPREHENSIVE METABOLIC PANEL WITH GFR
AG Ratio: 1.9 (calc) (ref 1.0–2.5)
ALT: 13 U/L (ref 6–29)
AST: 23 U/L (ref 10–35)
Albumin: 4.4 g/dL (ref 3.6–5.1)
Alkaline phosphatase (APISO): 54 U/L (ref 37–153)
BUN: 17 mg/dL (ref 7–25)
CO2: 28 mmol/L (ref 20–32)
Calcium: 9.2 mg/dL (ref 8.6–10.4)
Chloride: 104 mmol/L (ref 98–110)
Creat: 0.69 mg/dL (ref 0.60–1.00)
Globulin: 2.3 g/dL (ref 1.9–3.7)
Glucose, Bld: 102 mg/dL — ABNORMAL HIGH (ref 65–99)
Potassium: 4.2 mmol/L (ref 3.5–5.3)
Sodium: 140 mmol/L (ref 135–146)
Total Bilirubin: 0.5 mg/dL (ref 0.2–1.2)
Total Protein: 6.7 g/dL (ref 6.1–8.1)
eGFR: 91 mL/min/{1.73_m2} (ref >=60)

## 2024-06-04 LAB — HEPATITIS B DNA, ULTRAQUANTITATIVE, PCR
Hepatitis B DNA: NOT DETECTED [IU]/mL
Hepatitis B virus DNA: NOT DETECTED {Log_IU}/mL

## 2024-06-04 LAB — HEPATITIS B SURFACE ANTIGEN: Hepatitis B Surface Ag: REACTIVE — AB

## 2024-06-06 ENCOUNTER — Ambulatory Visit (INDEPENDENT_AMBULATORY_CARE_PROVIDER_SITE_OTHER): Payer: PPO | Admitting: Internal Medicine

## 2024-06-06 ENCOUNTER — Encounter: Payer: Self-pay | Admitting: Internal Medicine

## 2024-06-06 VITALS — BP 118/76 | HR 82 | Ht 61.5 in | Wt 144.8 lb

## 2024-06-06 DIAGNOSIS — M810 Age-related osteoporosis without current pathological fracture: Secondary | ICD-10-CM | POA: Diagnosis not present

## 2024-06-06 DIAGNOSIS — G43819 Other migraine, intractable, without status migrainosus: Secondary | ICD-10-CM

## 2024-06-06 DIAGNOSIS — B181 Chronic viral hepatitis B without delta-agent: Secondary | ICD-10-CM | POA: Diagnosis not present

## 2024-06-06 DIAGNOSIS — I10 Essential (primary) hypertension: Secondary | ICD-10-CM | POA: Diagnosis not present

## 2024-06-06 DIAGNOSIS — G4700 Insomnia, unspecified: Secondary | ICD-10-CM

## 2024-06-06 NOTE — Assessment & Plan Note (Signed)
Last DEXA scan reviewed On Boniva now On Calcium and Vitamin D supplement (Caltrate) 

## 2024-06-06 NOTE — Assessment & Plan Note (Signed)
On Baraclude Followed by GI

## 2024-06-06 NOTE — Progress Notes (Signed)
 Established Patient Office Visit  Subjective:  Patient ID: Alyssa Durham, female    DOB: 29-Jun-1951  Age: 73 y.o. MRN: [id number]  CC:  Chief Complaint  Patient presents with   Medical Management of Chronic Issues    4 month f/u    HPI Alyssa Durham is a 73 y.o. female with past medical history of HTN, chronic migraine, mild intermittent asthma related to allergies, osteoporosis, chronic hep B, lichen sclerosus, and gluteal tendinitis of right buttock who presents for f/u of her chronic medical conditions.  HTN: BP is well-controlled. Takes Amlodipine  5 mg QD regularly. Patient denies headache, dizziness, chest pain, dyspnea or palpitations.  Patient follows up with neurology for chronic migraine and takes Relpax  and Qulipta  for it.  She was on Nurtec in the past.   She has history of osteoporosis, for which she takes Boniva .  She is taking calcium and Vitamin D  supplements.  Constipation: She reports chronic constipation, due to Qulipta .  She has tried taking  MiraLAX for it with adequate relief. Denies any melena or hematochezia.  She has been stressed due to her mother's death in 10-11-2024.  She had conflicts with her brother regarding care of her mother.  She has had grief counseling through her mother's hospice team. She has chronic insomnia, for which she takes Ambien  as needed.  Denies any SI or HI currently.   Past Medical History:  Diagnosis Date   Allergy    Anxiety and depression    Arthritis    Asthma    Cataract    Chronic active type B viral hepatitis (HCC)    Depression    History of HPV infection 11/15/2017   HLD (hyperlipidemia) 05/12/2017   Localized swelling of left lower leg 01/01/2021   Migraines    Osteoporosis    Primary hypertension 01/01/2021    Past Surgical History:  Procedure Laterality Date   CESAREAN SECTION     x3   COLONOSCOPY WITH PROPOFOL  N/A 01/25/2024   Procedure: COLONOSCOPY WITH PROPOFOL ;  Surgeon: Urban Garden, MD;   [facility];  Service: Gastroenterology;  Laterality: N/A;  11:30am;asa 1-2   EYE SURGERY  2019 and 2020   cataracts   KNEE SURGERY  2023   LIVER BIOPSY  1993   POLYPECTOMY N/A 01/25/2024   Procedure: POLYPECTOMY INTESTINAL;  Surgeon: Urban Garden, MD;  [facility];  Service: Gastroenterology;  Laterality: N/A;   URETHRAL DILATION     WISDOM TOOTH EXTRACTION      Family History  Problem Relation Age of Onset   Uterine cancer Mother    Cancer Mother    Hearing loss Mother    Vision loss Mother    Migraines Father    Early death Father 35       suicide    Transient ischemic attack Father        multiple    Stroke Father    Healthy Daughter    Early death Paternal Aunt        suicide   Mental illness Paternal Aunt    Depression Paternal Aunt    Breast cancer Maternal Grandmother    Cancer Maternal Grandmother        breast   Alcohol abuse Paternal Grandfather    Migraines Brother    Drug abuse Son        addict - stable on suboxone   Hepatitis B Son    Healthy Son  Social History   Socioeconomic History   Marital status: Divorced    Spouse name: Not on file   Number of children: 3   Years of education: 14   Highest education level: Bachelor's degree (e.g., BA, AB, BS)  Occupational History   Occupation: retired    Comment: Advertising account executive  Tobacco Use   Smoking status: Former    Current packs/day: 0.00    Average packs/day: 2.0 packs/day for 5.0 years (10.0 ttl pk-yrs)    Types: Cigarettes    Start date: 12/21/1967    Quit date: 12/20/1972    Years since quitting: 51.4    Passive exposure: Past   Smokeless tobacco: Never   Tobacco comments:    I began smoking when I went to college. 1969 - 1974  Vaping Use   Vaping status: Never Used  Substance and Sexual Activity   Alcohol use: No   Drug use: No   Sexual activity: Not Currently  Other Topics Concern   Not on file  Social History Narrative   Retired Recruitment consultant   Three children  [place] , Software engineer and Geneticist, molecular - 2   Right handed   College   Social Drivers of Health   Financial Resource Strain: Medium Risk (02/15/2024)   Overall Financial Resource Strain (CARDIA)    Difficulty of Paying Living Expenses: Somewhat hard  Food Insecurity: No Food Insecurity (02/15/2024)   Hunger Vital Sign    Worried About Running Out of Food in the Last Year: Never true    Ran Out of Food in the Last Year: Never true  Transportation Needs: No Transportation Needs (02/15/2024)   PRAPARE - Administrator, Civil Service (Medical): No    Lack of Transportation (Non-Medical): No  Physical Activity: Inactive (02/15/2024)   Exercise Vital Sign    Days of Exercise per Week: 0 days    Minutes of Exercise per Session: 0 min  Stress: No Stress Concern Present (02/15/2024)   [employer] of Occupational Health - Occupational Stress Questionnaire    Feeling of Stress : Only a little  Social Connections: Moderately Integrated (02/15/2024)   Social Connection and Isolation Panel    Frequency of Communication with Friends and Family: Never    Frequency of Social Gatherings with Friends and Family: Three times a week    Attends Religious Services: More than 4 times per year    Active Member of Clubs or Organizations: Yes    Attends Banker Meetings: More than 4 times per year    Marital Status: Divorced  Intimate Partner Violence: Not At Risk (02/15/2024)   Humiliation, Afraid, Rape, and Kick questionnaire    Fear of Current or Ex-Partner: No    Emotionally Abused: No    Physically Abused: No    Sexually Abused: No    Outpatient Medications Prior to Visit  Medication Sig Dispense Refill   amLODipine  (NORVASC ) 5 MG tablet Take 1 tablet (5 mg total) by mouth daily. 90 tablet 1   Atogepant  (QULIPTA ) 60 MG TABS Take 1 tablet (60 mg total) by mouth daily. 30 tablet 12   cholecalciferol (VITAMIN D3) 25 MCG (1000 UNIT) tablet  Take 1,000 Units by mouth daily.     diclofenac  Sodium (VOLTAREN ) 1 % GEL APPLY 4 GRAMS TO THE AFFECTED AREA FOUR TIMES DAILY 100 g 2   entecavir  (BARACLUDE ) 0.5 MG tablet TAKE ONE TABLET BY MOUTH EVERY DAY 90 tablet 3   ibandronate  (BONIVA ) 150 MG  tablet TAKE (1) TABLET ONCE A MONTH ONLY. TAKE WITH 6 TO 8 OZ OF WATER  AND DO NOT LAY DOWN FOR 1 HOUR. 3 tablet 3   Multiple Vitamin (MULTIVITAMIN) capsule Take 1 capsule by mouth daily.     Multiple Vitamins-Minerals (HAIR SKIN & NAILS PO) Take by mouth daily at 6 (six) AM.     mupirocin  ointment (BACTROBAN ) 2 % APPLY 1 APPLICATION TOPICALLY TWICE DAILY. 22 g 0   OVER THE COUNTER MEDICATION CQ 10 daily  Magnesium citrate once per day.  Cartilage supplement daily Fish oil daily     RELPAX  40 MG tablet May repeat in 2 hours if headache persists or recurs. 12 tablet 11   tretinoin (RETIN-A) 0.1 % cream Apply 1 application topically at bedtime as needed. Patient states that she uses sporadic     zolpidem  (AMBIEN ) 5 MG tablet TAKE ONE TABLET BY MOUTH AT BEDTIME AS NEEDED 30 tablet 2   No facility-administered medications prior to visit.    Allergies  Allergen Reactions   Codeine Nausea And Vomiting and Other (See Comments)    ROS Review of Systems  Constitutional:  Negative for chills and fever.  HENT:  Negative for congestion, postnasal drip, rhinorrhea, sinus pressure, sinus pain and sore throat.   Eyes:  Negative for pain and discharge.  Respiratory:  Negative for cough and shortness of breath.   Cardiovascular:  Negative for chest pain and palpitations.  Gastrointestinal:  Negative for abdominal pain, diarrhea, nausea and vomiting.  Endocrine: Negative for polydipsia and polyuria.  Genitourinary:  Negative for dysuria and hematuria.  Musculoskeletal:  Positive for arthralgias, back pain and joint swelling. Negative for neck pain and neck stiffness.  Skin:  Negative for rash.  Neurological:  Negative for dizziness, seizures, syncope and  weakness.  Psychiatric/Behavioral:  Positive for sleep disturbance. Negative for agitation and behavioral problems. The patient is nervous/anxious.       Objective:    Physical Exam Vitals reviewed.  Constitutional:      General: She is not in acute distress.    Appearance: She is not diaphoretic.  HENT:     Head: Normocephalic and atraumatic.     Nose: Nose normal. No congestion.     Mouth/Throat:     Mouth: Mucous membranes are moist.     Pharynx: No posterior oropharyngeal erythema.   Eyes:     General: No scleral icterus.    Extraocular Movements: Extraocular movements intact.   Neck:     Vascular: No carotid bruit.   Cardiovascular:     Rate and Rhythm: Normal rate and regular rhythm.     Heart sounds: Normal heart sounds. No murmur heard. Pulmonary:     Breath sounds: Normal breath sounds. No wheezing or rales.   Musculoskeletal:     Left shoulder: Tenderness present. Decreased range of motion.     Right hand: Swelling (Mild PIP and DIP joints) present.     Left hand: Swelling (Mild PIP and DIP joints) present.     Cervical back: Neck supple. No tenderness.     Left knee: Swelling (Mild) present. No effusion or erythema. Decreased range of motion.     Left lower leg: Swelling present.   Skin:    General: Skin is warm.     Findings: No rash.   Neurological:     General: No focal deficit present.     Mental Status: She is alert and oriented to person, place, and time.   Psychiatric:  Mood and Affect: Mood normal.        Behavior: Behavior normal.     BP 118/76   Pulse 82   Ht 5' 1.5 (1.562 m)   Wt 144 lb 12.8 oz (65.7 kg)   SpO2 97%   BMI 26.92 kg/m  Wt Readings from Last 3 Encounters:  06/06/24 144 lb 12.8 oz (65.7 kg)  05/21/24 143 lb 4.8 oz (65 kg)  04/20/24 141 lb 1.9 oz (64 kg)    Lab Results  Component Value Date   TSH 2.530 08/01/2023   Lab Results  Component Value Date   WBC 5.3 05/30/2024   HGB 15.1 05/30/2024   HCT 44.6  05/30/2024   MCV 92.1 05/30/2024   PLT 289 05/30/2024   Lab Results  Component Value Date   NA 140 05/30/2024   K 4.2 05/30/2024   CO2 28 05/30/2024   GLUCOSE 102 (H) 05/30/2024   BUN 17 05/30/2024   CREATININE 0.69 05/30/2024   BILITOT 0.5 05/30/2024   ALKPHOS 63 08/01/2023   AST 23 05/30/2024   ALT 13 05/30/2024   PROT 6.7 05/30/2024   ALBUMIN 4.3 08/01/2023   CALCIUM 9.2 05/30/2024   EGFR 91 05/30/2024   Lab Results  Component Value Date   CHOL 211 (H) 08/01/2023   Lab Results  Component Value Date   HDL 75 08/01/2023   Lab Results  Component Value Date   LDLCALC 122 (H) 08/01/2023   Lab Results  Component Value Date   TRIG 79 08/01/2023   Lab Results  Component Value Date   CHOLHDL 2.8 08/01/2023   Lab Results  Component Value Date   HGBA1C 5.2 08/01/2023      Assessment & Plan:   Problem List Items Addressed This Visit       Cardiovascular and Mediastinum   Migraine   On Qulipta  and Relpax  Follows up with Neurology      Primary hypertension - Primary   BP Readings from Last 1 Encounters:  06/06/24 118/76   Well controlled with amlodipine  5 mg QD, refilled Had component of white coat hypertension Advised DASH diet and moderate exercise/walking, at least 150 mins/week        Digestive   Chronic hepatitis B (HCC)   On Baraclude  Followed by GI        Musculoskeletal and Integument   Osteoporosis   Last DEXA scan reviewed On Boniva  now On Calcium and Vitamin D  supplement (Caltrate)        Other   Insomnia (Chronic)   Recently worse due to conflict with her brothers regarding her mother's estates Takes Ambien  5 mg nightly as needed          No orders of the defined types were placed in this encounter.   Follow-up: Return in about 4 months (around 10/06/2024) for Annual physical (after 10/03/24).    Meldon Sport, MD

## 2024-06-06 NOTE — Assessment & Plan Note (Addendum)
 Recently worse due to conflict with her brothers regarding her mother's estates Takes Ambien  5 mg nightly as needed

## 2024-06-06 NOTE — Assessment & Plan Note (Signed)
On Qulipta and Relpax Follows up with Neurology

## 2024-06-06 NOTE — Assessment & Plan Note (Signed)
 BP Readings from Last 1 Encounters:  06/06/24 118/76   Well controlled with amlodipine  5 mg QD, refilled Had component of white coat hypertension Advised DASH diet and moderate exercise/walking, at least 150 mins/week

## 2024-06-06 NOTE — Patient Instructions (Signed)
Please continue to take medications as prescribed.  Please continue to follow low salt diet and perform moderate exercise/walking at least 150 mins/week. 

## 2024-07-03 ENCOUNTER — Other Ambulatory Visit: Payer: Self-pay

## 2024-07-03 ENCOUNTER — Encounter: Payer: Self-pay | Admitting: Internal Medicine

## 2024-07-03 DIAGNOSIS — J4520 Mild intermittent asthma, uncomplicated: Secondary | ICD-10-CM

## 2024-07-03 MED ORDER — ALBUTEROL SULFATE HFA 108 (90 BASE) MCG/ACT IN AERS: 2.0000 | INHALATION_SPRAY | Freq: Four times a day (QID) | RESPIRATORY_TRACT | 5 refills | Status: AC | PRN

## 2024-07-09 ENCOUNTER — Other Ambulatory Visit: Payer: Self-pay | Admitting: Internal Medicine

## 2024-07-09 DIAGNOSIS — I10 Essential (primary) hypertension: Secondary | ICD-10-CM

## 2024-07-12 DIAGNOSIS — L308 Other specified dermatitis: Secondary | ICD-10-CM | POA: Diagnosis not present

## 2024-08-30 ENCOUNTER — Other Ambulatory Visit: Payer: Self-pay | Admitting: Internal Medicine

## 2024-08-30 DIAGNOSIS — F19982 Other psychoactive substance use, unspecified with psychoactive substance-induced sleep disorder: Secondary | ICD-10-CM

## 2024-09-21 ENCOUNTER — Ambulatory Visit: Payer: PPO | Admitting: Neurology

## 2024-09-21 ENCOUNTER — Encounter: Payer: Self-pay | Admitting: Neurology

## 2024-09-21 VITALS — BP 118/72 | HR 78 | Ht 61.5 in | Wt 146.0 lb

## 2024-09-21 DIAGNOSIS — G43709 Chronic migraine without aura, not intractable, without status migrainosus: Secondary | ICD-10-CM | POA: Diagnosis not present

## 2024-09-21 MED ORDER — QULIPTA 60 MG PO TABS: 1.0000 | ORAL_TABLET | Freq: Every day | ORAL | 12 refills | Status: AC

## 2024-09-21 MED ORDER — RELPAX 40 MG PO TABS: ORAL_TABLET | ORAL | 11 refills | Status: AC

## 2024-09-21 NOTE — Patient Instructions (Signed)
 I wish you all the best. Continue all your medications. Follow-up in 1 year, call for any changes.

## 2024-09-21 NOTE — Progress Notes (Signed)
 NEUROLOGY FOLLOW UP OFFICE NOTE  Alyssa Durham [id number] 09-06-1951  HISTORY OF PRESENT ILLNESS: I had the pleasure of seeing Alyssa Durham in follow-up in the neurology clinic on 09/21/2024.  The patient was last seen a year ago for chronic migraine. She is alone in the office today. Records and images were personally reviewed where available.  She continues to have good response to Qulipta  60mg  daily for migraine prophylaxis, calling it a miracle drug. She has had few very small headaches, taking 1/4 Relpax  with good response. Last week she had 2-3 migraines close together. No side effects on medications. She denies any dizziness, vision changes, focal numbness/tingling/weakness, no falls. She continues to have a lot of stress with family issues and works with a Paramedic. She gets 8 hours of sleep, taking Ambien .    History on Initial Assessment 08/16/2016: This is a 73 yo RH woman with a history of chronic migraines, depression. She moved from [place]  2 months ago, records from her previous neurologist were reviewed. She has had migraines since 1969. Migraines now are mostly localized over the right hemisphere, if she closes her eyes, she sees flashing lights, but no prior aura. This is associated with nausea, vomiting, photo and phonophobia. She has 4-5 migraine days a week, taking 1/2 to 2 tablets of Relpax  at the onset, which does help ease progression of symptoms. For the past 4 years, she has had migraines upon awakening. Migraines were fairly well-controlled until 1993 when she was started on a trial of an unrecalled antidepressant which helped. In December 2016, headaches worsened and she was started on Lexapro, which helped with headaches but caused weight gain and hair loss. She tried Celexa (weight gain), Zoloft (weight gain), Wellbutrin. She also tried Topamax but stopped due to risk for kidney stones. She recalls taking Inderal but cannot recall effects/side effects. Most recently, she was  tried on nortriptyline, which did not help with the headaches, but helped her mood. She is now on Cymbalta  for arthritis, which also helps with her mood. For rescue, she has tried sumatriptan, Maxalt, and Zomig , with no effect. Relpax  and Frova helped the most, causing less palpitations. She has noticed stress to be a big trigger, as well a chocolates. She usually sleeps good. There is a family history of migraines in her father, brother, and niece. She had received 2 rounds of Botox  injections for chronic migraine, the first one helped very well with much less use of Relpax . Last Botox  was in April 2017 before her move.    She is also reporting memory loss. She had told her neurologist about this in 1994. Memory issues are more for long-term memory, she cannot recall what happened years ago with her family, which concerns her because she cannot recall her children growing up. Short-term memory is pretty good, she denies getting lost driving, no missed medications or bill payments. She denies any dizziness, diplopia, dysarthria, dysphagia, neck pain, bowel/bladder dysfunction. She has floaters in her left eye.    Diagnostic Data: MRI brain without contrast done 04/17/15 at [facility] in [place]  did not show any acute changes, there was subtle white matter hyperintensity in the posterior left occipital lobe. Compared to 2007 study, this is only minimally progressive. EEG done 04/18/15 in [place]  was within normal limits. She had a liver MRI with incidental finding of syrinx on the thoracic cord. 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 No underlying mass, mass effect, or cord thinning. She is asymptomatic from this.   Prior Preventative Medications: Zoloft, Topamax, Inderal, nortriptyline, Cymbalta , Botox , Zonisamide , Aimovig  Prior Rescue medications: Relpax , sumatriptan, maxalt,  Zomig , Frova, Amerge (did help), Ubrelvy , Nurtec Has not tried: gabapentin, depakote  PAST MEDICAL HISTORY: Past Medical History:  Diagnosis Date   Allergy    Anxiety and depression    Arthritis    Asthma    Cataract    Chronic active type B viral hepatitis (HCC)    Depression    History of HPV infection 11/15/2017   HLD (hyperlipidemia) 05/12/2017   Localized swelling of left lower leg 01/01/2021   Migraines    Osteoporosis    Primary hypertension 01/01/2021    MEDICATIONS: Current Outpatient Medications on File Prior to Visit  Medication Sig Dispense Refill   albuterol  (PROAIR  HFA) 108 (90 Base) MCG/ACT inhaler Inhale 2 puffs into the lungs every 6 (six) hours as needed for wheezing or shortness of breath. 18 g 5   amLODipine  (NORVASC ) 5 MG tablet Take 1 tablet (5 mg total) by mouth daily. 90 tablet 1   Atogepant  (QULIPTA ) 60 MG TABS Take 1 tablet (60 mg total) by mouth daily. 30 tablet 12   cholecalciferol (VITAMIN D3) 25 MCG (1000 UNIT) tablet Take 1,000 Units by mouth daily.     diclofenac  Sodium (VOLTAREN ) 1 % GEL APPLY 4 GRAMS TO THE AFFECTED AREA FOUR TIMES DAILY 100 g 2   entecavir  (BARACLUDE ) 0.5 MG tablet TAKE ONE TABLET BY MOUTH EVERY DAY 90 tablet 3   ibandronate  (BONIVA ) 150 MG tablet TAKE (1) TABLET ONCE A MONTH ONLY. TAKE WITH 6 TO 8 OZ OF WATER  AND DO NOT LAY DOWN FOR 1 HOUR. 3 tablet 3   Multiple Vitamin (MULTIVITAMIN) capsule Take 1 capsule by mouth daily.     Multiple Vitamins-Minerals (HAIR SKIN & NAILS PO) Take by mouth daily at 6 (six) AM.     mupirocin  ointment (BACTROBAN ) 2 % APPLY 1 APPLICATION TOPICALLY TWICE DAILY. 22 g 0   OVER THE COUNTER MEDICATION CQ 10 daily  Magnesium citrate once per day.  Cartilage supplement daily Fish oil daily     RELPAX  40 MG tablet May repeat in 2 hours if headache persists or recurs. 12 tablet 11   tretinoin (RETIN-A) 0.1 % cream Apply 1 application topically at bedtime as needed. Patient states that she uses sporadic      zolpidem  (AMBIEN ) 5 MG tablet TAKE ONE TABLET BY MOUTH AT BEDTIME AS NEEDED 30 tablet 3   No current facility-administered medications on file prior to visit.    ALLERGIES: Allergies  Allergen Reactions   Codeine Nausea And Vomiting and Other (See Comments)    FAMILY HISTORY: Family History  Problem Relation Age of Onset   Uterine cancer Mother    Cancer Mother    Hearing loss Mother    Vision loss Mother    Migraines Father    Early death Father 42       suicide    Transient ischemic attack Father        multiple    Stroke Father    Healthy Daughter    Early death Paternal Aunt        suicide   Mental illness Paternal Aunt    Depression Paternal Aunt    Breast cancer Maternal Grandmother    Cancer Maternal Grandmother        breast   Alcohol abuse  Paternal Grandfather    Migraines Brother    Drug abuse Son        addict - stable on suboxone   Hepatitis B Son    Healthy Son     SOCIAL HISTORY: Social History   Socioeconomic History   Marital status: Divorced    Spouse name: Not on file   Number of children: 3   Years of education: 14   Highest education level: Bachelor's degree (e.g., BA, AB, BS)  Occupational History   Occupation: retired    Comment: Advertising account executive  Tobacco Use   Smoking status: Former    Current packs/day: 0.00    Average packs/day: 2.0 packs/day for 5.0 years (10.0 ttl pk-yrs)    Types: Cigarettes    Start date: 12/21/1967    Quit date: 12/20/1972    Years since quitting: 51.7    Passive exposure: Past   Smokeless tobacco: Never   Tobacco comments:    I began smoking when I went to college. 1969 - 1974  Vaping Use   Vaping status: Never Used  Substance and Sexual Activity   Alcohol use: No   Drug use: No   Sexual activity: Not Currently  Other Topics Concern   Not on file  Social History Narrative   Retired Environmental health practitioner   Three children  [place] , Software engineer and Geneticist, molecular - 2   Right handed    College   Social Drivers of Health   Financial Resource Strain: Medium Risk (02/15/2024)   Overall Financial Resource Strain (CARDIA)    Difficulty of Paying Living Expenses: Somewhat hard  Food Insecurity: No Food Insecurity (02/15/2024)   Hunger Vital Sign    Worried About Running Out of Food in the Last Year: Never true    Ran Out of Food in the Last Year: Never true  Transportation Needs: No Transportation Needs (02/15/2024)   PRAPARE - Administrator, Civil Service (Medical): No    Lack of Transportation (Non-Medical): No  Physical Activity: Inactive (02/15/2024)   Exercise Vital Sign    Days of Exercise per Week: 0 days    Minutes of Exercise per Session: 0 min  Stress: No Stress Concern Present (02/15/2024)   [employer] of Occupational Health - Occupational Stress Questionnaire    Feeling of Stress : Only a little  Social Connections: Moderately Integrated (02/15/2024)   Social Connection and Isolation Panel    Frequency of Communication with Friends and Family: Never    Frequency of Social Gatherings with Friends and Family: Three times a week    Attends Religious Services: More than 4 times per year    Active Member of Clubs or Organizations: Yes    Attends Banker Meetings: More than 4 times per year    Marital Status: Divorced  Intimate Partner Violence: Not At Risk (02/15/2024)   Humiliation, Afraid, Rape, and Kick questionnaire    Fear of Current or Ex-Partner: No    Emotionally Abused: No    Physically Abused: No    Sexually Abused: No     PHYSICAL EXAM: Vitals:   09/21/24 1356  BP: 118/72  Pulse: 78  SpO2: 98%   General: No acute distress Head:  Normocephalic/atraumatic Skin/Extremities: No rash, no edema Neurological Exam: alert and awake. No aphasia or dysarthria. Fund of knowledge is appropriate.  Attention and concentration are normal.   Cranial nerves: Pupils equal, round. Extraocular movements intact with no nystagmus.  Visual fields full.  No  facial asymmetry.  Motor: Bulk and tone normal, muscle strength 5/5 throughout with no pronator drift.   Finger to nose testing intact.  Gait narrow-based and steady, no ataxia.  Romberg negative.   IMPRESSION: This is a 73 yo RH woman with chronic migraines who had significant improvement on Qulipta  60mg  daily. She has prn Relpax  for rescue. She continues to have good response and is satisfied with migraine control. Refills sent. Follow-up in 1 year, call for any changes.    Thank you for allowing me to participate in her care.  Please do not hesitate to call for any questions or concerns.    Darice Shivers, M.D.   CC: Dr. Tobie

## 2024-10-03 ENCOUNTER — Encounter (INDEPENDENT_AMBULATORY_CARE_PROVIDER_SITE_OTHER): Payer: Self-pay | Admitting: Gastroenterology

## 2024-10-04 ENCOUNTER — Encounter (INDEPENDENT_AMBULATORY_CARE_PROVIDER_SITE_OTHER): Payer: Self-pay | Admitting: *Deleted

## 2024-10-09 ENCOUNTER — Other Ambulatory Visit: Payer: Self-pay | Admitting: Internal Medicine

## 2024-10-09 DIAGNOSIS — M810 Age-related osteoporosis without current pathological fracture: Secondary | ICD-10-CM

## 2024-10-10 ENCOUNTER — Telehealth (INDEPENDENT_AMBULATORY_CARE_PROVIDER_SITE_OTHER): Payer: Self-pay

## 2024-10-10 ENCOUNTER — Other Ambulatory Visit (INDEPENDENT_AMBULATORY_CARE_PROVIDER_SITE_OTHER): Payer: Self-pay

## 2024-10-10 DIAGNOSIS — B181 Chronic viral hepatitis B without delta-agent: Secondary | ICD-10-CM

## 2024-10-10 NOTE — Telephone Encounter (Signed)
 Called patient, gave her US  appt for 11/20/2024 at 9:30. She stated she may have to reschedule. I gave the patient the number for central scheduling.

## 2024-10-11 ENCOUNTER — Encounter: Admitting: Internal Medicine

## 2024-10-24 DIAGNOSIS — H43393 Other vitreous opacities, bilateral: Secondary | ICD-10-CM | POA: Diagnosis not present

## 2024-11-05 ENCOUNTER — Ambulatory Visit: Payer: Self-pay

## 2024-11-05 ENCOUNTER — Ambulatory Visit
Admission: EM | Admit: 2024-11-05 | Discharge: 2024-11-05 | Disposition: A | Discharge status: Home / Self Care | Attending: Nurse Practitioner | Admitting: Nurse Practitioner

## 2024-11-05 DIAGNOSIS — S61452A Open bite of left hand, initial encounter: Secondary | ICD-10-CM | POA: Diagnosis not present

## 2024-11-05 DIAGNOSIS — W5501XA Bitten by cat, initial encounter: Secondary | ICD-10-CM

## 2024-11-05 MED ORDER — AMOXICILLIN-POT CLAVULANATE 875-125 MG PO TABS: 1.0000 | ORAL_TABLET | Freq: Two times a day (BID) | ORAL | 0 refills | Status: DC

## 2024-11-05 MED ORDER — TETANUS-DIPHTH-ACELL PERTUSSIS 5-2-15.5 LF-MCG/0.5 IM SUSP
0.5000 mL | Freq: Once | INTRAMUSCULAR | Status: AC
  Administered 2024-11-05: 0.5 mL via INTRAMUSCULAR

## 2024-11-05 MED ORDER — MUPIROCIN 2 % EX OINT: 1.0000 | TOPICAL_OINTMENT | Freq: Two times a day (BID) | CUTANEOUS | 0 refills | Status: DC

## 2024-11-05 NOTE — ED Provider Notes (Signed)
 [facility]    CSN: [id number] Arrival date & time: 11/05/24  1334      History   Chief Complaint Chief Complaint  Patient presents with   Animal Bite    HPI Alyssa Durham is a 73 y.o. female.   Patient presents today with cat bite to left hand.  Reports she was sitting on her couch when she felt her cat bite her hand unprovoked yesterday.  Reports her cat is a silly indoor cat and recently received its rabies vaccine.  She reports the cat bite is painful and a little bit red.  It is not bleeding.  No red streaking up the arm.  No fevers or nausea/vomiting.  She immediately applied Band-Aids to the area but has not cleaned it yet.    Past Medical History:  Diagnosis Date   Allergy    Anxiety and depression    Arthritis    Asthma    Cataract    Chronic active type B viral hepatitis (HCC)    Depression    History of HPV infection 11/15/2017   HLD (hyperlipidemia) 05/12/2017   Localized swelling of left lower leg 01/01/2021   Migraines    Osteoporosis    Primary hypertension 01/01/2021    Patient Active Problem List   Diagnosis Date Noted   Positive colorectal cancer screening using Cologuard test 11/14/2023   Chronic constipation 11/14/2023   Grief reaction 10/04/2023   Rotator cuff tendinitis, left 06/09/2023   Presbycusis of both ears 03/18/2023   Insomnia 09/28/2022   Overweight (BMI 25.0-29.9) 04/28/2022   Hepatic fibrosis 02/16/2022   Synovial cyst of left popliteal space 01/19/2022   Tear of medial meniscus of knee 01/19/2022   Encounter for general adult medical examination with abnormal findings 09/24/2021   Upper back pain on right side 09/24/2021   Osteoarthritis of hand 09/24/2021   Osteoarthritis 05/22/2021   Primary hypertension 01/01/2021   Vitamin D  deficiency 01/01/2021   Chronic rhinosinusitis 12/24/2020   Mild intermittent asthma without complication 12/24/2020   Elevated rheumatoid factor 12/24/2020   Degeneration of lumbar  intervertebral disc 10/28/2020   Hair loss 09/30/2020   Low back pain 07/17/2020   Gluteal tendinitis of right buttock 04/19/2020   Labral tear of right hip joint 04/19/2020   Hyperlipidemia 05/12/2017   RBBB 05/12/2017   Calcification of aorta 04/04/2017   Chronic hepatitis B (HCC) 03/29/2017   Lichen sclerosus of female genitalia 03/29/2017   Osteoporosis 03/29/2017   Migraine 08/16/2016    Past Surgical History:  Procedure Laterality Date   CESAREAN SECTION     x3   COLONOSCOPY WITH PROPOFOL  N/A 01/25/2024   Procedure: COLONOSCOPY WITH PROPOFOL ;  Surgeon: Eartha Angelia Sieving, MD;  Location: [facility];  Service: Gastroenterology;  Laterality: N/A;  11:30am;asa 1-2   EYE SURGERY  2019 and 2020   cataracts   KNEE SURGERY  2023   LIVER BIOPSY  1993   POLYPECTOMY N/A 01/25/2024   Procedure: POLYPECTOMY INTESTINAL;  Surgeon: Eartha Angelia Sieving, MD;  Location: [facility];  Service: Gastroenterology;  Laterality: N/A;   URETHRAL DILATION     WISDOM TOOTH EXTRACTION      OB History   No obstetric history on file.      Home Medications    Prior to Admission medications   Medication Sig Start Date End Date Taking? Authorizing Provider  albuterol  (PROAIR  HFA) 108 (90 Base) MCG/ACT inhaler Inhale 2 puffs into the lungs every 6 (six) hours as  needed for wheezing or shortness of breath. 07/03/24  Yes Tobie Suzzane POUR, MD  amLODipine  (NORVASC ) 5 MG tablet Take 1 tablet (5 mg total) by mouth daily. 04/20/24  Yes Bevely Doffing, FNP  amoxicillin-clavulanate (AUGMENTIN) 875-125 MG tablet Take 1 tablet by mouth 2 (two) times daily for 7 days. 11/05/24 11/12/24 Yes Chandra Harlene LABOR, NP  Atogepant  (QULIPTA ) 60 MG TABS Take 1 tablet (60 mg total) by mouth daily. 09/21/24  Yes Georjean Darice HERO, MD  cholecalciferol (VITAMIN D3) 25 MCG (1000 UNIT) tablet Take 1,000 Units by mouth daily.   Yes [provider]  diclofenac  Sodium (VOLTAREN ) 1 % GEL APPLY 4 GRAMS TO THE  AFFECTED AREA FOUR TIMES DAILY 10/24/23  Yes Tobie Suzzane POUR, MD  entecavir  (BARACLUDE ) 0.5 MG tablet TAKE ONE TABLET BY MOUTH EVERY DAY 05/26/24  Yes Eartha Flavors, Toribio, MD  ibandronate  (BONIVA ) 150 MG tablet TAKE (1) TABLET ONCE A MONTH ONLY. TAKE WITH 6 TO 8 OZ OF WATER  AND DO NOT LAY DOWN FOR 1 HOUR. 10/09/24  Yes Tobie Suzzane POUR, MD  Multiple Vitamin (MULTIVITAMIN) capsule Take 1 capsule by mouth daily.   Yes [provider]  Multiple Vitamins-Minerals (HAIR SKIN & NAILS PO) Take by mouth daily at 6 (six) AM.   Yes [provider]  mupirocin  ointment (BACTROBAN ) 2 % APPLY 1 APPLICATION TOPICALLY TWICE DAILY. 03/13/24  Yes Tobie Suzzane POUR, MD  mupirocin  ointment (BACTROBAN ) 2 % Apply 1 Application topically 2 (two) times daily for 7 days. 11/05/24 11/12/24 Yes Chandra Harlene LABOR, NP  OVER THE COUNTER MEDICATION CQ 10 daily  Magnesium citrate once per day.  Cartilage supplement daily Fish oil daily   Yes [provider]  RELPAX  40 MG tablet May repeat in 2 hours if headache persists or recurs. 09/21/24  Yes Georjean Darice HERO, MD  tretinoin (RETIN-A) 0.1 % cream Apply 1 application topically at bedtime as needed. Patient states that she uses sporadic 08/18/20  Yes [provider]  zolpidem  (AMBIEN ) 5 MG tablet TAKE ONE TABLET BY MOUTH AT BEDTIME AS NEEDED 08/30/24  Yes Tobie Suzzane POUR, MD    Family History Family History  Problem Relation Age of Onset   Uterine cancer Mother    Cancer Mother    Hearing loss Mother    Vision loss Mother    Migraines Father    Early death Father 21       suicide    Transient ischemic attack Father        multiple    Stroke Father    Healthy Daughter    Early death Paternal Aunt        suicide   Mental illness Paternal Aunt    Depression Paternal Aunt    Breast cancer Maternal Grandmother    Cancer Maternal Grandmother        breast   Alcohol abuse Paternal Grandfather    Migraines Brother    Drug abuse Son         addict - stable on suboxone   Hepatitis B Son    Healthy Son     Social History Social History   Tobacco Use   Smoking status: Former    Current packs/day: 0.00    Average packs/day: 2.0 packs/day for 5.0 years (10.0 ttl pk-yrs)    Types: Cigarettes    Start date: 12/21/1967    Quit date: 12/20/1972    Years since quitting: 51.9    Passive exposure: Past   Smokeless tobacco:  Never   Tobacco comments:    I began smoking when I went to college. 8030 - 1974  Vaping Use   Vaping status: Never Used  Substance Use Topics   Alcohol use: No   Drug use: No     Allergies   Codeine   Review of Systems Review of Systems Per HPI  Physical Exam Triage Vital Signs ED Triage Vitals  Encounter Vitals Group     BP 11/05/24 1520 (!) 130/90     Girls Systolic BP Percentile --      Girls Diastolic BP Percentile --      Boys Systolic BP Percentile --      Boys Diastolic BP Percentile --      Pulse Rate 11/05/24 1520 (!) 106     Resp 11/05/24 1520 16     Temp 11/05/24 1520 98.2 F (36.8 C)     Temp Source 11/05/24 1520 Oral     SpO2 11/05/24 1520 95 %     Weight 11/05/24 1516 143 lb (64.9 kg)     Height 11/05/24 1516 5' 2 (1.575 m)     Head Circumference --      Peak Flow --      Pain Score 11/05/24 1516 2     Pain Loc --      Pain Education --      Exclude from Growth Chart --    No data found.  Updated Vital Signs BP (!) 130/90 (BP Location: Left Arm)   Pulse (!) 106   Temp 98.2 F (36.8 C) (Oral)   Resp 16   Ht 5' 2 (1.575 m)   Wt 143 lb (64.9 kg)   SpO2 95%   BMI 26.16 kg/m   Visual Acuity Right Eye Distance:   Left Eye Distance:   Bilateral Distance:    Right Eye Near:   Left Eye Near:    Bilateral Near:     Physical Exam Vitals and nursing note reviewed.  Constitutional:      General: She is not in acute distress.    Appearance: Normal appearance. She is not toxic-appearing.  HENT:     Head: Normocephalic and atraumatic.     Right Ear:  External ear normal.     Left Ear: External ear normal.     Mouth/Throat:     Mouth: Mucous membranes are moist.     Pharynx: Oropharynx is clear.  Pulmonary:     Effort: Pulmonary effort is normal. No respiratory distress.  Skin:    General: Skin is warm and dry.     Capillary Refill: Capillary refill takes less than 2 seconds.     Coloration: Skin is not jaundiced or pale.     Findings: No erythema.     Comments: Linear thin well approximated laceration to dorsal left hand.  There is minimal surrounding erythema.  No red streaking up the arm, warmth, drainage, or tenderness to touch.  Neurological:     Mental Status: She is alert and oriented to person, place, and time.  Psychiatric:        Behavior: Behavior is cooperative.      UC Treatments / Results  Labs (all labs ordered are listed, but only abnormal results are displayed) Labs Reviewed - No data to display  EKG   Radiology No results found.  Procedures Procedures (including critical care time)  Medications Ordered in UC Medications  Tdap (ADACEL) injection 0.5 mL (0.5 mLs Intramuscular Given 11/05/24 1605)  Initial Impression / Assessment and Plan / UC Course  I have reviewed the triage vital signs and the nursing notes.  Pertinent labs & imaging results that were available during my care of the patient were reviewed by me and considered in my medical decision making (see chart for details).   In triage, patient is mildly hypertensive and tachycardic, otherwise vital signs are stable.  On exam, she is well-appearing.  The laceration on the left hand caused by the animal bite appears to be well-approximated and healing on its own already.  We discussed contraindication for wound closure due to mechanism of injury.  Wound care discussed with patient-start cleaning twice daily with soap and water , apply mupirocin  ointment and Band-Aid twice daily additionally.  Tdap updated today.  Start oral Augmentin to  prevent/treat infection given animal bite.  Return and ER precautions discussed with patient.  The patient was given the opportunity to ask questions.  All questions answered to their satisfaction.  The patient is in agreement to this plan.   Final Clinical Impressions(s) / UC Diagnoses   Final diagnoses:  Cat bite of left hand, initial encounter     Discharge Instructions      We have updated your tetanus shot today.  For the cat bite, clean the area twice daily with soap and water .  Then, apply a thin layer of mupirocin  ointment over the wound twice daily and cover with a fresh band aid.  Take the Augmentin twice daily for 7 days to treat for infection in the wound.  Seek care if symptoms persist/worsen despite treatment.    ED Prescriptions     Medication Sig Dispense Auth. Provider   amoxicillin-clavulanate (AUGMENTIN) 875-125 MG tablet Take 1 tablet by mouth 2 (two) times daily for 7 days. 14 tablet Chandra Raisin A, NP   mupirocin  ointment (BACTROBAN ) 2 % Apply 1 Application topically 2 (two) times daily for 7 days. 22 g Chandra Raisin LABOR, NP      PDMP not reviewed this encounter.   Chandra Raisin LABOR, NP 11/05/24 [phone]

## 2024-11-05 NOTE — ED Triage Notes (Signed)
 Pt states that she was bit by her cat on her left hand. X1 day

## 2024-11-05 NOTE — Telephone Encounter (Signed)
 FYI Only or Action Required?: FYI only for provider: UC.  Patient was last seen in primary care on 06/06/2024 by Tobie Suzzane POUR, MD.  Called Nurse Triage reporting Animal Bite.  Symptoms began yesterday.  Interventions attempted: Other: no active bleeding, but large wound.  Symptoms are: unchanged.  Triage Disposition: Go to ED Now (or to Office With PCP Approval)  Patient/caregiver understands and will follow disposition?: Yes         Copied from CRM #[id number]. Topic: Clinical - Red Word Triage >> Nov 05, 2024 11:52 AM Montie POUR wrote: Red Word that prompted transfer to Nurse Triage:  Her cat bit her last night and the wound is open and not bleeding right now. Swollen around wound Reason for Disposition  [1] Cut or tear AND [2] skin is split open or gaping (or length > 1/4 inch or 6 mm) AND [3] any animal  (Exception: Superficial scratch that doesn't go through dermis.)  Answer Assessment - Initial Assessment Questions 1. APPEARANCE What does it look like?  (e.g., abrasion, bruise, puncture)      swelling 2. SIZE: How big is the bite? (e.g., inches, cm; or compare to size of coin, pea, grape, ping pong ball)      2-3 inches long - open, not actively bleeding 3. LOCATION: Where is the bite located?      Back of hand 4. ONSET: When did the bite happen? (e.g., minutes, hours ago)      Last night 5. ANIMAL: What type of animal caused the bite? Is the injury from a bite or a claw? If the animal is a dog or a cat, ask: Was it a pet or a stray? Was it acting ill or behaving strangely?     cat 6. RABIES VACCINE: For dog or cat bites, ask: Do you know if the pet is vaccinated against rabies?  (e.g., yes, no, overdue for rabies shot, unknown)     Endorses UTD 7. CIRCUMSTANCES: Tell me how this happened.      I think she was trying to get my attention 8. TETANUS: When was your last tetanus booster?     > 10 years 9. PREGNANCY: Is there any chance you are  pregnant? When was your last menstrual period?     N/a  Protocols used: Animal Bite-A-AH

## 2024-11-05 NOTE — Discharge Instructions (Signed)
 We have updated your tetanus shot today.  For the cat bite, clean the area twice daily with soap and water .  Then, apply a thin layer of mupirocin  ointment over the wound twice daily and cover with a fresh band aid.  Take the Augmentin twice daily for 7 days to treat for infection in the wound.  Seek care if symptoms persist/worsen despite treatment.

## 2024-11-08 ENCOUNTER — Encounter (INDEPENDENT_AMBULATORY_CARE_PROVIDER_SITE_OTHER): Payer: Self-pay | Admitting: Gastroenterology

## 2024-11-08 ENCOUNTER — Ambulatory Visit (INDEPENDENT_AMBULATORY_CARE_PROVIDER_SITE_OTHER): Admitting: Gastroenterology

## 2024-11-08 VITALS — BP 122/80 | HR 97 | Temp 97.8°F | Ht 61.5 in | Wt 148.0 lb

## 2024-11-08 DIAGNOSIS — K7400 Hepatic fibrosis, unspecified: Secondary | ICD-10-CM | POA: Diagnosis not present

## 2024-11-08 DIAGNOSIS — B181 Chronic viral hepatitis B without delta-agent: Secondary | ICD-10-CM

## 2024-11-08 DIAGNOSIS — K5909 Other constipation: Secondary | ICD-10-CM | POA: Diagnosis not present

## 2024-11-08 NOTE — Patient Instructions (Signed)
 Perform blood workup Continue entecavir  0.5 mg qday Proceed with scheduled ultrasound in December 2025 Continue taking senna 2 tablets/day and MiraLAX

## 2024-11-08 NOTE — Progress Notes (Signed)
 Toribio Fortune, M.D. Gastroenterology & Hepatology [facility] [address] [place], [place] [address]  Primary Care Physician: Tobie Suzzane POUR, MD [address] [place] [address]  I will communicate my assessment and recommendations to the referring MD via EMR.  Problems: Hepatitis B on Entecavir , hepatitis B e antibody positive Chronic idiopathic constipation History of liver fibrosis   History of Present Illness: Alyssa Durham is a 73 y.o. female with past medical history of anxiety, depression, arthritis, asthma, HLD, Osteoporosis, HTN, Chronic hep b on Entecavir , who comes for follow-up of hepatitis B.   The patient was last seen on 05/21/2024. At that time, the patient was continued on Entecavir  0.5 mg daily.  Advised to take Senokot on a daily basis.  Labs on 05/30/2024 showed negative hepatitis B viral load and positive hepatitis B surface antigen.  CMP was within normal limits, as well as CBC.  Ultrasound of the abdomen was within normal limits, with possible changes of fatty liver.  She is scheduled to have a repeat ultrasound on 11/20/2024.  Patient has had recurrent issues with constipation. She has been taking Senna 2 pills and takes 2 teaspoons of Miralax daily. She attributes constipation to Qulipta  and use of amlodipine .  The patient denies having any nausea, vomiting, fever, chills, hematochezia, melena, hematemesis, abdominal distention, abdominal pain, diarrhea, jaundice, pruritus or weight loss.  Last EGD: Never Last Colonoscopy: 01/25/2024 - Three 4 to 8 mm polyps in the descending colon, in the ascending colon and in the cecum, removed with a cold snare. Resected and retrieved. - Non- bleeding internal hemorrhoids. The pathology showed 3 polyps were tubular adenomas.   recommend a repeat colonoscopy in 5 years.   Past Medical History: Past Medical History:  Diagnosis Date   Allergy    Anxiety and depression     Arthritis    Asthma    Cataract    Chronic active type B viral hepatitis (HCC)    Depression    History of HPV infection 11/15/2017   HLD (hyperlipidemia) 05/12/2017   Localized swelling of left lower leg 01/01/2021   Migraines    Osteoporosis    Primary hypertension 01/01/2021    Past Surgical History: Past Surgical History:  Procedure Laterality Date   CESAREAN SECTION     x3   COLONOSCOPY WITH PROPOFOL  N/A 01/25/2024   Procedure: COLONOSCOPY WITH PROPOFOL ;  Surgeon: Fortune Angelia Toribio, MD;  [facility];  Service: Gastroenterology;  Laterality: N/A;  11:30am;asa 1-2   EYE SURGERY  2019 and 2020   cataracts   KNEE SURGERY  2023   LIVER BIOPSY  1993   POLYPECTOMY N/A 01/25/2024   Procedure: POLYPECTOMY INTESTINAL;  Surgeon: Fortune Angelia Toribio, MD;  Location: [facility];  Service: Gastroenterology;  Laterality: N/A;   URETHRAL DILATION     WISDOM TOOTH EXTRACTION      Family History: Family History  Problem Relation Age of Onset   Uterine cancer Mother    Cancer Mother    Hearing loss Mother    Vision loss Mother    Migraines Father    Early death Father 47       suicide    Transient ischemic attack Father        multiple    Stroke Father    Healthy Daughter    Early death Paternal Aunt        suicide   Mental illness Paternal Aunt    Depression  Paternal Aunt    Breast cancer Maternal Grandmother    Cancer Maternal Grandmother        breast   Alcohol abuse Paternal Grandfather    Migraines Brother    Drug abuse Son        addict - stable on suboxone   Hepatitis B Son    Healthy Son     Social History: Social History   Tobacco Use  Smoking Status Former   Current packs/day: 0.00   Average packs/day: 2.0 packs/day for 5.0 years (10.0 ttl pk-yrs)   Types: Cigarettes   Start date: 12/21/1967   Quit date: 12/20/1972   Years since quitting: 51.9   Passive exposure: Past  Smokeless Tobacco Never  Tobacco Comments   I began  smoking when I went to college. 74 - 31   Social History   Substance and Sexual Activity  Alcohol Use No   Social History   Substance and Sexual Activity  Drug Use No    Allergies: Allergies  Allergen Reactions   Codeine Nausea And Vomiting and Other (See Comments)    Medications: Current Outpatient Medications  Medication Sig Dispense Refill   albuterol  (PROAIR  HFA) 108 (90 Base) MCG/ACT inhaler Inhale 2 puffs into the lungs every 6 (six) hours as needed for wheezing or shortness of breath. 18 g 5   amLODipine  (NORVASC ) 5 MG tablet Take 1 tablet (5 mg total) by mouth daily. 90 tablet 1   Atogepant  (QULIPTA ) 60 MG TABS Take 1 tablet (60 mg total) by mouth daily. 30 tablet 12   cholecalciferol (VITAMIN D3) 25 MCG (1000 UNIT) tablet Take 1,000 Units by mouth daily.     diclofenac  Sodium (VOLTAREN ) 1 % GEL APPLY 4 GRAMS TO THE AFFECTED AREA FOUR TIMES DAILY 100 g 2   entecavir  (BARACLUDE ) 0.5 MG tablet TAKE ONE TABLET BY MOUTH EVERY DAY 90 tablet 3   ibandronate  (BONIVA ) 150 MG tablet TAKE (1) TABLET ONCE A MONTH ONLY. TAKE WITH 6 TO 8 OZ OF WATER  AND DO NOT LAY DOWN FOR 1 HOUR. (Patient taking differently: every 30 (thirty) days. TAKE (1) TABLET ONCE A MONTH ONLY. TAKE WITH 6 TO 8 OZ OF WATER  AND DO NOT LAY DOWN FOR 1 HOUR.) 3 tablet 3   Multiple Vitamin (MULTIVITAMIN) capsule Take 1 capsule by mouth daily.     Multiple Vitamins-Minerals (HAIR SKIN & NAILS PO) Take by mouth daily at 6 (six) AM.     mupirocin  ointment (BACTROBAN ) 2 % APPLY 1 APPLICATION TOPICALLY TWICE DAILY. 22 g 0   OVER THE COUNTER MEDICATION CQ 10 daily  Magnesium citrate once per day.  Cartilage supplement daily Fish oil daily     RELPAX  40 MG tablet May repeat in 2 hours if headache persists or recurs. (Patient taking differently: as needed. May repeat in 2 hours if headache persists or recurs.) 12 tablet 11   tretinoin (RETIN-A) 0.1 % cream Apply 1 application topically at bedtime as needed. Patient  states that she uses sporadic     zolpidem  (AMBIEN ) 5 MG tablet TAKE ONE TABLET BY MOUTH AT BEDTIME AS NEEDED 30 tablet 3   No current facility-administered medications for this visit.    Review of Systems: GENERAL: negative for malaise, night sweats HEENT: No changes in hearing or vision, no nose bleeds or other nasal problems. NECK: Negative for lumps, goiter, pain and significant neck swelling RESPIRATORY: Negative for cough, wheezing CARDIOVASCULAR: Negative for chest pain, leg swelling, palpitations, orthopnea GI: SEE HPI MUSCULOSKELETAL:  Negative for joint pain or swelling, back pain, and muscle pain. SKIN: Negative for lesions, rash PSYCH: Negative for sleep disturbance, mood disorder and recent psychosocial stressors. HEMATOLOGY Negative for prolonged bleeding, bruising easily, and swollen nodes. ENDOCRINE: Negative for cold or heat intolerance, polyuria, polydipsia and goiter. NEURO: negative for tremor, gait imbalance, syncope and seizures. The remainder of the review of systems is noncontributory.   Physical Exam: BP 122/80 (BP Location: Right Arm, Patient Position: Sitting, Cuff Size: Normal)   Pulse 97   Temp 97.8 F (36.6 C) (Temporal)   Ht 5' 1.5 (1.562 m)   Wt 148 lb (67.1 kg)   BMI 27.51 kg/m  GENERAL: The patient is AO x3, in no acute distress. HEENT: Head is normocephalic and atraumatic. EOMI are intact. Mouth is well hydrated and without lesions. NECK: Supple. No masses LUNGS: Clear to auscultation. No presence of rhonchi/wheezing/rales. Adequate chest expansion HEART: RRR, normal s1 and s2. ABDOMEN: Soft, nontender, no guarding, no peritoneal signs, and nondistended. BS +. No masses. EXTREMITIES: Without any cyanosis, clubbing, rash, lesions or edema. NEUROLOGIC: AOx3, no focal motor deficit. SKIN: no jaundice, no rashes  Imaging/Labs: as above  I personally reviewed and interpreted the available labs, imaging and endoscopic files.  Impression and  Plan: Linn Melena Hayes is a 73 y.o. female with past medical history of anxiety, depression, arthritis, asthma, HLD, Osteoporosis, HTN, Chronic hep b on Entecavir , who comes for follow-up of hepatitis B.  Patient has been doing well while on Entecavir  and has tolerated the medication adequately.  Her viral load has been suppressed and liver enzymes have been normal.  We discussed the need to repeat surveillance blood workup today.  Given the presence of liver fibrosis, she needs to proceed with HCC screening with AFP and ultrasound.  Finally, she has been able to manage her constipation with senna and MiraLAX.  She is to continue same regimen.  Constipation is likely related to her current medication regimen.  - Check CBC, CMP, hepatitis B viral load, hepatitis B surface antigen and AFP -Continue entecavir  0.5 mg qday -Proceed with scheduled ultrasound in December 2025 -Continue taking senna 2 tablets/day and MiraLAX  All questions were answered.      Toribio Fortune, MD Gastroenterology and Hepatology [facility] Gastroenterology

## 2024-11-12 ENCOUNTER — Encounter: Payer: Self-pay | Admitting: Neurology

## 2024-11-12 ENCOUNTER — Ambulatory Visit (INDEPENDENT_AMBULATORY_CARE_PROVIDER_SITE_OTHER): Payer: Self-pay | Admitting: Gastroenterology

## 2024-11-12 LAB — CBC WITH DIFFERENTIAL/PLATELET
Absolute Lymphocytes: 1007 {cells}/uL (ref 850–3900)
Absolute Monocytes: 445 {cells}/uL (ref 200–950)
Basophils Absolute: 32 {cells}/uL (ref 0–200)
Basophils Relative: 0.6 %
Eosinophils Absolute: 58 {cells}/uL (ref 15–500)
Eosinophils Relative: 1.1 %
HCT: 45.4 % — ABNORMAL HIGH (ref 35.0–45.0)
Hemoglobin: 15.5 g/dL (ref 11.7–15.5)
MCH: 31.2 pg (ref 27.0–33.0)
MCHC: 34.1 g/dL (ref 32.0–36.0)
MCV: 91.3 fL (ref 80.0–100.0)
MPV: 10.6 fL (ref 7.5–12.5)
Monocytes Relative: 8.4 %
Neutro Abs: 3758 {cells}/uL (ref 1500–7800)
Neutrophils Relative %: 70.9 %
Platelets: 317 Thousand/uL (ref 140–400)
RBC: 4.97 Million/uL (ref 3.80–5.10)
RDW: 12.1 % (ref 11.0–15.0)
Total Lymphocyte: 19 %
WBC: 5.3 Thousand/uL (ref 3.8–10.8)

## 2024-11-12 LAB — COMPREHENSIVE METABOLIC PANEL WITH GFR
AG Ratio: 1.8 (calc) (ref 1.0–2.5)
ALT: 16 U/L (ref 6–29)
AST: 25 U/L (ref 10–35)
Albumin: 4.2 g/dL (ref 3.6–5.1)
Alkaline phosphatase (APISO): 56 U/L (ref 37–153)
BUN: 18 mg/dL (ref 7–25)
CO2: 28 mmol/L (ref 20–32)
Calcium: 9.4 mg/dL (ref 8.6–10.4)
Chloride: 104 mmol/L (ref 98–110)
Creat: 0.79 mg/dL (ref 0.60–1.00)
Globulin: 2.4 g/dL (ref 1.9–3.7)
Glucose, Bld: 90 mg/dL (ref 65–139)
Potassium: 4.3 mmol/L (ref 3.5–5.3)
Sodium: 141 mmol/L (ref 135–146)
Total Bilirubin: 0.6 mg/dL (ref 0.2–1.2)
Total Protein: 6.6 g/dL (ref 6.1–8.1)
eGFR: 78 mL/min/1.73m2 (ref >=60)

## 2024-11-12 LAB — AFP TUMOR MARKER: AFP-Tumor Marker: 4.1 ng/mL

## 2024-11-12 LAB — HEPATITIS B DNA, ULTRAQUANTITATIVE, PCR
Hepatitis B DNA: NOT DETECTED [IU]/mL
Hepatitis B virus DNA: NOT DETECTED {Log_IU}/mL

## 2024-11-12 LAB — HEPATITIS B SURFACE ANTIGEN: Hepatitis B Surface Ag: REACTIVE — AB

## 2024-11-13 ENCOUNTER — Encounter: Payer: Self-pay | Admitting: Internal Medicine

## 2024-11-13 ENCOUNTER — Ambulatory Visit (INDEPENDENT_AMBULATORY_CARE_PROVIDER_SITE_OTHER): Payer: Self-pay | Admitting: Internal Medicine

## 2024-11-13 VITALS — BP 116/68 | HR 89 | Ht 61.5 in | Wt 147.0 lb

## 2024-11-13 DIAGNOSIS — R739 Hyperglycemia, unspecified: Secondary | ICD-10-CM

## 2024-11-13 DIAGNOSIS — E782 Mixed hyperlipidemia: Secondary | ICD-10-CM | POA: Diagnosis not present

## 2024-11-13 DIAGNOSIS — I10 Essential (primary) hypertension: Secondary | ICD-10-CM

## 2024-11-13 DIAGNOSIS — G43819 Other migraine, intractable, without status migrainosus: Secondary | ICD-10-CM

## 2024-11-13 DIAGNOSIS — Z0001 Encounter for general adult medical examination with abnormal findings: Secondary | ICD-10-CM | POA: Diagnosis not present

## 2024-11-13 DIAGNOSIS — M810 Age-related osteoporosis without current pathological fracture: Secondary | ICD-10-CM

## 2024-11-13 DIAGNOSIS — W5501XD Bitten by cat, subsequent encounter: Secondary | ICD-10-CM | POA: Diagnosis not present

## 2024-11-13 DIAGNOSIS — M19041 Primary osteoarthritis, right hand: Secondary | ICD-10-CM

## 2024-11-13 DIAGNOSIS — E559 Vitamin D deficiency, unspecified: Secondary | ICD-10-CM

## 2024-11-13 MED ORDER — DICLOFENAC SODIUM 1 % EX GEL: 4.0000 g | Freq: Four times a day (QID) | CUTANEOUS | 2 refills | Status: AC

## 2024-11-13 NOTE — Assessment & Plan Note (Addendum)
 Check DEXA scan On Boniva  now On Calcium and Vitamin D  supplement (Caltrate)

## 2024-11-13 NOTE — Assessment & Plan Note (Addendum)
 BP Readings from Last 1 Encounters:  11/13/24 116/68   Well controlled with amlodipine  5 mg QD, refilled Had component of white coat hypertension Advised DASH diet and moderate exercise/walking, at least 150 mins/week

## 2024-11-13 NOTE — Assessment & Plan Note (Addendum)
On Qulipta and Relpax Follows up with Neurology

## 2024-11-13 NOTE — Assessment & Plan Note (Addendum)
 Last vitamin D  Lab Results  Component Value Date   VD25OH 46.0 08/01/2023   Takes vitamin D  supplements

## 2024-11-13 NOTE — Assessment & Plan Note (Addendum)
 Right thumb PIP joint tenderness and swelling Voltaren  gel as needed for pain If persistent pain, will refer to hand surgery

## 2024-11-13 NOTE — Assessment & Plan Note (Addendum)
 Advised to follow low cholesterol diet Check lipid profile

## 2024-11-13 NOTE — Assessment & Plan Note (Addendum)
 Physical exam as documented. Counseling done  re healthy lifestyle involving commitment to 150 minutes exercise per week, heart healthy diet, and attaining healthy weight.The importance of adequate sleep also discussed. Immunization and cancer screening needs are specifically addressed at this visit.

## 2024-11-13 NOTE — Patient Instructions (Addendum)
 Please schedule DEXA scan.  Please apply Voltaren  gel for right thumb pain.  Please continue to take medications as prescribed.  Please continue to follow low carb diet and perform moderate exercise/walking at least 150 mins/week.

## 2024-11-13 NOTE — Progress Notes (Signed)
 Established Patient Office Visit  Subjective:  Patient ID: Alyssa Durham, female    DOB: August 22, 1951  Age: 73 y.o. MRN: [id number]  CC:  Chief Complaint  Patient presents with   Annual Exam    Annual exam    HPI Alyssa Durham is a 73 y.o. female with past medical history of HTN, chronic migraine, mild intermittent asthma related to allergies, osteoporosis, chronic hep B, lichen sclerosus, and gluteal tendinitis of right buttock who presents for annual physical.  Cat bite: She had a cat bite on her left hand on 11/05/24, went to Urgent care and was given Tdap vaccine and Augmentin .  She has not taken Augmentin  due to difficulty swallowing it.  She agrees to crush it and take it now.  Denies any active bleeding or discharge currently.  Denies any fever or chills.  HTN: BP is well-controlled. Takes Amlodipine  5 mg QD regularly. Patient denies headache, dizziness, chest pain, dyspnea or palpitations.  Patient follows up with neurology for chronic migraine and takes Relpax  and Qulipta  for it.  She was on Nurtec in the past.   She has history of osteoporosis, for which she takes Boniva .  She is taking calcium and Vitamin D  supplements.  She has been stressed due to her mother's death in 10-10-24.  She had conflicts with her brother regarding care of her mother.  She has had grief counseling through her mother's hospice team. She has chronic insomnia, for which she takes Ambien  as needed.  Denies any SI or HI currently.   Past Medical History:  Diagnosis Date   Allergy    Anxiety and depression    Arthritis    Asthma    Cataract    Chronic active type B viral hepatitis (HCC)    Depression    History of HPV infection 11/15/2017   HLD (hyperlipidemia) 05/12/2017   Localized swelling of left lower leg 01/01/2021   Migraines    Osteoporosis    Primary hypertension 01/01/2021    Past Surgical History:  Procedure Laterality Date   CESAREAN SECTION     x3   COLONOSCOPY WITH  PROPOFOL  N/A 01/25/2024   Procedure: COLONOSCOPY WITH PROPOFOL ;  Surgeon: Eartha Angelia Sieving, MD;  [facility]: [facility];  Service: Gastroenterology;  Laterality: N/A;  11:30am;asa 1-2   EYE SURGERY  2019 and 2020   cataracts   KNEE SURGERY  2023   LIVER BIOPSY  1993   POLYPECTOMY N/A 01/25/2024   Procedure: POLYPECTOMY INTESTINAL;  Surgeon: Eartha Angelia Sieving, MD;  [facility];  Service: Gastroenterology;  Laterality: N/A;   URETHRAL DILATION     WISDOM TOOTH EXTRACTION      Family History  Problem Relation Age of Onset   Uterine cancer Mother    Cancer Mother    Hearing loss Mother    Vision loss Mother    Migraines Father    Early death Father 32       suicide    Transient ischemic attack Father        multiple    Stroke Father    Healthy Daughter    Early death Paternal Aunt        suicide   Mental illness Paternal Aunt    Depression Paternal Aunt    Breast cancer Maternal Grandmother    Cancer Maternal Grandmother        breast   Alcohol abuse Paternal Grandfather    Migraines Brother    Drug abuse Son  addict - stable on suboxone   Hepatitis B Son    Healthy Son     Social History   Socioeconomic History   Marital status: Divorced    Spouse name: Not on file   Number of children: 3   Years of education: 14   Highest education level: Bachelor's degree (e.g., BA, AB, BS)  Occupational History   Occupation: retired    Comment: Advertising account executive  Tobacco Use   Smoking status: Former    Current packs/day: 0.00    Average packs/day: 2.0 packs/day for 5.0 years (10.0 ttl pk-yrs)    Types: Cigarettes    Start date: 12/21/1967    Quit date: 12/20/1972    Years since quitting: 51.9    Passive exposure: Past   Smokeless tobacco: Never   Tobacco comments:    I began smoking when I went to college. 1969 - 1974  Vaping Use   Vaping status: Never Used  Substance and Sexual Activity   Alcohol use: No   Drug use: No   Sexual activity:  Not Currently  Other Topics Concern   Not on file  Social History Narrative   Retired environmental health practitioner   Three children  [place] , Software Engineer and Geneticist, Molecular - 2   Right handed   College   Social Drivers of Health   Financial Resource Strain: Medium Risk (11/12/2024)   Overall Financial Resource Strain (CARDIA)    Difficulty of Paying Living Expenses: Somewhat hard  Food Insecurity: No Food Insecurity (11/12/2024)   Hunger Vital Sign    Worried About Running Out of Food in the Last Year: Never true    Ran Out of Food in the Last Year: Never true  Transportation Needs: No Transportation Needs (11/12/2024)   PRAPARE - Administrator, Civil Service (Medical): No    Lack of Transportation (Non-Medical): No  Physical Activity: Insufficiently Active (11/12/2024)   Exercise Vital Sign    Days of Exercise per Week: 2 days    Minutes of Exercise per Session: 20 min  Stress: No Stress Concern Present (11/12/2024)   [employer] of Occupational Health - Occupational Stress Questionnaire    Feeling of Stress: Only a little  Social Connections: Unknown (11/12/2024)   Social Connection and Isolation Panel    Frequency of Communication with Friends and Family: Patient declined    Frequency of Social Gatherings with Friends and Family: Twice a week    Attends Religious Services: More than 4 times per year    Active Member of [employer] or Organizations: Yes    Attends Engineer, Structural: More than 4 times per year    Marital Status: Divorced  Intimate Partner Violence: Not At Risk (02/15/2024)   Humiliation, Afraid, Rape, and Kick questionnaire    Fear of Current or Ex-Partner: No    Emotionally Abused: No    Physically Abused: No    Sexually Abused: No    Outpatient Medications Prior to Visit  Medication Sig Dispense Refill   albuterol  (PROAIR  HFA) 108 (90 Base) MCG/ACT inhaler Inhale 2 puffs into the lungs every 6 (six) hours as needed for  wheezing or shortness of breath. 18 g 5   amLODipine  (NORVASC ) 5 MG tablet Take 1 tablet (5 mg total) by mouth daily. 90 tablet 1   Atogepant  (QULIPTA ) 60 MG TABS Take 1 tablet (60 mg total) by mouth daily. 30 tablet 12   cholecalciferol (VITAMIN D3) 25 MCG (1000 UNIT) tablet Take 1,000 Units  by mouth daily.     entecavir  (BARACLUDE ) 0.5 MG tablet TAKE ONE TABLET BY MOUTH EVERY DAY 90 tablet 3   ibandronate  (BONIVA ) 150 MG tablet TAKE (1) TABLET ONCE A MONTH ONLY. TAKE WITH 6 TO 8 OZ OF WATER  AND DO NOT LAY DOWN FOR 1 HOUR. (Patient taking differently: every 30 (thirty) days. TAKE (1) TABLET ONCE A MONTH ONLY. TAKE WITH 6 TO 8 OZ OF WATER  AND DO NOT LAY DOWN FOR 1 HOUR.) 3 tablet 3   Multiple Vitamin (MULTIVITAMIN) capsule Take 1 capsule by mouth daily.     Multiple Vitamins-Minerals (HAIR SKIN & NAILS PO) Take by mouth daily at 6 (six) AM.     mupirocin  ointment (BACTROBAN ) 2 % APPLY 1 APPLICATION TOPICALLY TWICE DAILY. 22 g 0   OVER THE COUNTER MEDICATION CQ 10 daily  Magnesium citrate once per day.  Cartilage supplement daily Fish oil daily     RELPAX  40 MG tablet May repeat in 2 hours if headache persists or recurs. (Patient taking differently: as needed. May repeat in 2 hours if headache persists or recurs.) 12 tablet 11   tretinoin (RETIN-A) 0.1 % cream Apply 1 application topically at bedtime as needed. Patient states that she uses sporadic     zolpidem  (AMBIEN ) 5 MG tablet TAKE ONE TABLET BY MOUTH AT BEDTIME AS NEEDED 30 tablet 3   diclofenac  Sodium (VOLTAREN ) 1 % GEL APPLY 4 GRAMS TO THE AFFECTED AREA FOUR TIMES DAILY 100 g 2   No facility-administered medications prior to visit.    Allergies  Allergen Reactions   Codeine Nausea And Vomiting and Other (See Comments)    ROS Review of Systems  Constitutional:  Negative for chills and fever.  HENT:  Negative for congestion, postnasal drip, rhinorrhea, sinus pressure, sinus pain and sore throat.   Eyes:  Negative for pain and  discharge.  Respiratory:  Negative for cough and shortness of breath.   Cardiovascular:  Negative for chest pain and palpitations.  Gastrointestinal:  Negative for abdominal pain, diarrhea, nausea and vomiting.  Endocrine: Negative for polydipsia and polyuria.  Genitourinary:  Negative for dysuria and hematuria.  Musculoskeletal:  Positive for arthralgias, back pain and joint swelling. Negative for neck pain and neck stiffness.  Skin:  Negative for rash.  Neurological:  Negative for dizziness, seizures, syncope and weakness.  Psychiatric/Behavioral:  Positive for sleep disturbance. Negative for agitation and behavioral problems. The patient is nervous/anxious.       Objective:    Physical Exam Vitals reviewed.  Constitutional:      General: She is not in acute distress.    Appearance: She is not diaphoretic.  HENT:     Head: Normocephalic and atraumatic.     Nose: Nose normal. No congestion.     Mouth/Throat:     Mouth: Mucous membranes are moist.     Pharynx: No posterior oropharyngeal erythema.  Eyes:     General: No scleral icterus.    Extraocular Movements: Extraocular movements intact.  Neck:     Vascular: No carotid bruit.  Cardiovascular:     Rate and Rhythm: Normal rate and regular rhythm.     Heart sounds: Normal heart sounds. No murmur heard. Pulmonary:     Breath sounds: Normal breath sounds. No wheezing or rales.  Abdominal:     Palpations: Abdomen is soft.     Tenderness: There is no abdominal tenderness.  Musculoskeletal:     Left shoulder: Tenderness present. Decreased range of motion.  Right hand: Swelling (Mild PIP and DIP joints) present.     Left hand: Swelling (Mild PIP and DIP joints) present.     Cervical back: Neck supple. No tenderness.     Left knee: Swelling (Mild) present. No effusion or erythema. Decreased range of motion.     Left lower leg: Swelling present.  Skin:    General: Skin is warm.     Findings: Bruising (Cat bite over dorsum  of left hand - laceration about 4 cm in length, appears well-healing) present. No rash.  Neurological:     General: No focal deficit present.     Mental Status: She is alert and oriented to person, place, and time.     Cranial Nerves: No cranial nerve deficit.     Sensory: No sensory deficit.     Motor: No weakness.  Psychiatric:        Mood and Affect: Mood normal.        Behavior: Behavior normal.     BP 116/68   Pulse 89   Ht 5' 1.5 (1.562 m)   Wt 147 lb (66.7 kg)   SpO2 98%   BMI 27.33 kg/m  Wt Readings from Last 3 Encounters:  11/13/24 147 lb (66.7 kg)  11/08/24 148 lb (67.1 kg)  11/05/24 143 lb (64.9 kg)    Lab Results  Component Value Date   TSH 2.530 08/01/2023   Lab Results  Component Value Date   WBC 5.3 11/08/2024   HGB 15.5 11/08/2024   HCT 45.4 (H) 11/08/2024   MCV 91.3 11/08/2024   PLT 317 11/08/2024   Lab Results  Component Value Date   NA 141 11/08/2024   K 4.3 11/08/2024   CO2 28 11/08/2024   GLUCOSE 90 11/08/2024   BUN 18 11/08/2024   CREATININE 0.79 11/08/2024   BILITOT 0.6 11/08/2024   ALKPHOS 63 08/01/2023   AST 25 11/08/2024   ALT 16 11/08/2024   PROT 6.6 11/08/2024   ALBUMIN 4.3 08/01/2023   CALCIUM 9.4 11/08/2024   EGFR 78 11/08/2024   Lab Results  Component Value Date   CHOL 211 (H) 08/01/2023   Lab Results  Component Value Date   HDL 75 08/01/2023   Lab Results  Component Value Date   LDLCALC 122 (H) 08/01/2023   Lab Results  Component Value Date   TRIG 79 08/01/2023   Lab Results  Component Value Date   CHOLHDL 2.8 08/01/2023   Lab Results  Component Value Date   HGBA1C 5.2 08/01/2023      Assessment & Plan:   Assessment & Plan Encounter for general adult medical examination with abnormal findings Physical exam as documented. Counseling done  re healthy lifestyle involving commitment to 150 minutes exercise per week, heart healthy diet, and attaining healthy weight.The importance of adequate sleep also  discussed. Immunization and cancer screening needs are specifically addressed at this visit. Age-related osteoporosis without current pathological fracture Check DEXA scan On Boniva  now On Calcium and Vitamin D  supplement (Caltrate) Primary hypertension BP Readings from Last 1 Encounters:  11/13/24 116/68   Well controlled with amlodipine  5 mg QD, refilled Had component of white coat hypertension Advised DASH diet and moderate exercise/walking, at least 150 mins/week Other migraine without status migrainosus, intractable On Qulipta  and Relpax  Follows up with Neurology Hyperglycemia Check HbA1c Mixed hyperlipidemia Advised to follow low-cholesterol diet Check lipid profile Vitamin D  deficiency Last vitamin D  Lab Results  Component Value Date   VD25OH 46.0 08/01/2023  Takes vitamin D  supplements Primary osteoarthritis of right hand Right thumb PIP joint tenderness and swelling Voltaren  gel as needed for pain If persistent pain, will refer to hand surgery Cat bite, subsequent encounter Advised to start taking Augmentin  - okay to crush if she has difficulty swallowing the tablet Keep area clean and dry Advised to keep area covered until healed        Meds ordered this encounter  Medications   diclofenac  Sodium (VOLTAREN ) 1 % GEL    Sig: Apply 4 g topically 4 (four) times daily.    Dispense:  100 g    Refill:  2    Follow-up: Return in about 6 months (around 05/13/2025) for HTN and osteoporosis.    Suzzane MARLA Blanch, MD

## 2024-11-13 NOTE — Assessment & Plan Note (Signed)
 Advised to perform simple hand exercises Voltaren  gel PRN If persistent pain, will refer to Hand surgery

## 2024-11-14 ENCOUNTER — Ambulatory Visit: Payer: Self-pay | Admitting: Internal Medicine

## 2024-11-14 LAB — TSH+FREE T4
Free T4: 1.14 ng/dL (ref 0.82–1.77)
TSH: 1.66 u[IU]/mL (ref 0.450–4.500)

## 2024-11-14 LAB — VITAMIN D 25 HYDROXY (VIT D DEFICIENCY, FRACTURES): Vit D, 25-Hydroxy: 52 ng/mL (ref 30.0–100.0)

## 2024-11-14 LAB — HEMOGLOBIN A1C
Est. average glucose Bld gHb Est-mCnc: 97 mg/dL
Hgb A1c MFr Bld: 5 % (ref 4.8–5.6)

## 2024-11-14 LAB — LIPID PANEL
Chol/HDL Ratio: 2.7 ratio (ref 0.0–4.4)
Cholesterol, Total: 217 mg/dL — ABNORMAL HIGH (ref 100–199)
HDL: 80 mg/dL (ref >39)
LDL Chol Calc (NIH): 124 mg/dL — ABNORMAL HIGH (ref 0–99)
Triglycerides: 72 mg/dL (ref 0–149)
VLDL Cholesterol Cal: 13 mg/dL (ref 5–40)

## 2024-11-20 ENCOUNTER — Ambulatory Visit (HOSPITAL_COMMUNITY)
Admission: RE | Admit: 2024-11-20 | Discharge: 2024-11-20 | Disposition: A | Discharge status: Home / Self Care | Source: Ambulatory Visit | Attending: Internal Medicine | Admitting: Internal Medicine

## 2024-11-20 ENCOUNTER — Ambulatory Visit (HOSPITAL_COMMUNITY)
Admission: RE | Admit: 2024-11-20 | Discharge: 2024-11-20 | Disposition: A | Source: Ambulatory Visit | Attending: Gastroenterology

## 2024-11-20 DIAGNOSIS — B181 Chronic viral hepatitis B without delta-agent: Secondary | ICD-10-CM | POA: Insufficient documentation

## 2024-11-20 DIAGNOSIS — Z780 Asymptomatic menopausal state: Secondary | ICD-10-CM | POA: Diagnosis not present

## 2024-11-20 DIAGNOSIS — M810 Age-related osteoporosis without current pathological fracture: Secondary | ICD-10-CM | POA: Diagnosis not present

## 2024-11-20 DIAGNOSIS — K7689 Other specified diseases of liver: Secondary | ICD-10-CM | POA: Diagnosis not present

## 2024-11-24 ENCOUNTER — Ambulatory Visit (INDEPENDENT_AMBULATORY_CARE_PROVIDER_SITE_OTHER): Payer: Self-pay | Admitting: Gastroenterology

## 2024-12-03 NOTE — Telephone Encounter (Signed)
 Elastography added to US  recall

## 2025-01-03 ENCOUNTER — Other Ambulatory Visit: Payer: Self-pay | Admitting: Internal Medicine

## 2025-01-03 DIAGNOSIS — I10 Essential (primary) hypertension: Secondary | ICD-10-CM

## 2025-01-10 ENCOUNTER — Other Ambulatory Visit: Payer: Self-pay | Admitting: Internal Medicine

## 2025-01-10 DIAGNOSIS — I10 Essential (primary) hypertension: Secondary | ICD-10-CM

## 2025-01-10 MED ORDER — AMLODIPINE BESYLATE 5 MG PO TABS: 5.0000 mg | ORAL_TABLET | Freq: Every day | ORAL | 1 refills | Status: AC

## 2025-01-10 NOTE — Telephone Encounter (Signed)
 Copied from CRM #[id number]. Topic: Clinical - Medication Refill >> Jan 10, 2025  9:34 AM Tonda B wrote: Medication: amLODipine  (NORVASC ) 5 MG tablet   Has the patient contacted their pharmacy? Yes (Agent: If no, request that the patient contact the pharmacy for the refill. If patient does not wish to contact the pharmacy document the reason why and proceed with request.) (Agent: If yes, when and what did the pharmacy advise?)  This is the patient's preferred pharmacy:  [facility] - [place], [place] - [address] [place] [address] Phone: [phone] Fax: [phone]  Is this the correct pharmacy for this prescription? Yes If no, delete pharmacy and type the correct one.   Has the prescription been filled recently? Yes  Is the patient out of the medication? No  Has the patient been seen for an appointment in the last year OR does the patient have an upcoming appointment? Yes  Can we respond through MyChart? No  Agent: Please be advised that Rx refills may take up to 3 business days. We ask that you follow-up with your pharmacy.

## 2025-02-19 ENCOUNTER — Ambulatory Visit: Payer: PPO

## 2025-05-14 ENCOUNTER — Ambulatory Visit: Admitting: Internal Medicine

## 2025-09-23 ENCOUNTER — Ambulatory Visit: Admitting: Neurology
